# Patient Record
Sex: Female | Born: 1995
Health system: Southern US, Community
[De-identification: ages and names within clinical notes are randomized; demographics above are authoritative.]

## PROBLEM LIST (undated history)

## (undated) ENCOUNTER — Inpatient Hospital Stay: Payer: Self-pay

## (undated) ENCOUNTER — Inpatient Hospital Stay (HOSPITAL_COMMUNITY): Payer: Self-pay

## (undated) DIAGNOSIS — F418 Other specified anxiety disorders: Secondary | ICD-10-CM

## (undated) DIAGNOSIS — O4400 Placenta previa specified as without hemorrhage, unspecified trimester: Secondary | ICD-10-CM

## (undated) DIAGNOSIS — Z8632 Personal history of gestational diabetes: Secondary | ICD-10-CM

## (undated) DIAGNOSIS — O24419 Gestational diabetes mellitus in pregnancy, unspecified control: Secondary | ICD-10-CM

## (undated) DIAGNOSIS — N939 Abnormal uterine and vaginal bleeding, unspecified: Secondary | ICD-10-CM

## (undated) DIAGNOSIS — G43909 Migraine, unspecified, not intractable, without status migrainosus: Secondary | ICD-10-CM

## (undated) DIAGNOSIS — Q909 Down syndrome, unspecified: Secondary | ICD-10-CM

## (undated) DIAGNOSIS — R519 Headache, unspecified: Secondary | ICD-10-CM

## (undated) DIAGNOSIS — Z20828 Contact with and (suspected) exposure to other viral communicable diseases: Secondary | ICD-10-CM

## (undated) HISTORY — DX: Headache, unspecified: R51.9

## (undated) HISTORY — DX: Complete placenta previa nos or without hemorrhage, unspecified trimester: O44.00

## (undated) HISTORY — DX: Gestational diabetes mellitus in pregnancy, unspecified control: O24.419

## (undated) HISTORY — DX: Down syndrome, unspecified: Q90.9

## (undated) HISTORY — DX: Contact with and (suspected) exposure to other viral communicable diseases: Z20.828

## (undated) HISTORY — PX: WISDOM TOOTH EXTRACTION: SHX21

---
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
## 2013-03-25 ENCOUNTER — Ambulatory Visit: Payer: Self-pay | Admitting: Urology

## 2014-02-06 ENCOUNTER — Ambulatory Visit: Payer: Self-pay | Admitting: Obstetrics and Gynecology

## 2016-11-08 ENCOUNTER — Emergency Department
Admission: EM | Admit: 2016-11-08 | Discharge: 2016-11-08 | Discharge status: Against Medical Advice | Payer: BLUE CROSS/BLUE SHIELD | Attending: Emergency Medicine | Admitting: Emergency Medicine

## 2016-11-08 ENCOUNTER — Encounter: Payer: Self-pay | Admitting: *Deleted

## 2016-11-08 ENCOUNTER — Emergency Department: Payer: BLUE CROSS/BLUE SHIELD

## 2016-11-08 DIAGNOSIS — O009 Unspecified ectopic pregnancy without intrauterine pregnancy: Secondary | ICD-10-CM

## 2016-11-08 DIAGNOSIS — O021 Missed abortion: Secondary | ICD-10-CM | POA: Diagnosis not present

## 2016-11-08 DIAGNOSIS — O209 Hemorrhage in early pregnancy, unspecified: Secondary | ICD-10-CM | POA: Diagnosis present

## 2016-11-08 DIAGNOSIS — N939 Abnormal uterine and vaginal bleeding, unspecified: Secondary | ICD-10-CM

## 2016-11-08 DIAGNOSIS — O4690 Antepartum hemorrhage, unspecified, unspecified trimester: Secondary | ICD-10-CM

## 2016-11-08 LAB — URINALYSIS, COMPLETE (UACMP) WITH MICROSCOPIC
Bacteria, UA: NONE SEEN
Bilirubin Urine: NEGATIVE
GLUCOSE, UA: NEGATIVE mg/dL
KETONES UR: NEGATIVE mg/dL
NITRITE: NEGATIVE
PH: 5 (ref 5.0–8.0)
PROTEIN: 30 mg/dL — AB
Specific Gravity, Urine: 1.017 (ref 1.005–1.030)

## 2016-11-08 LAB — ABO/RH: ABO/RH(D): A POS

## 2016-11-08 LAB — CBC
HEMATOCRIT: 41.3 % (ref 35.0–47.0)
HEMOGLOBIN: 14.5 g/dL (ref 12.0–16.0)
MCH: 29.3 pg (ref 26.0–34.0)
MCHC: 35.1 g/dL (ref 32.0–36.0)
MCV: 83.5 fL (ref 80.0–100.0)
Platelets: 191 10*3/uL (ref 150–440)
RBC: 4.95 MIL/uL (ref 3.80–5.20)
RDW: 12.9 % (ref 11.5–14.5)
WBC: 6 10*3/uL (ref 3.6–11.0)

## 2016-11-08 LAB — HCG, QUANTITATIVE, PREGNANCY: hCG, Beta Chain, Quant, S: 6865 m[IU]/mL — ABNORMAL HIGH (ref <5)

## 2016-11-08 NOTE — ED Notes (Signed)
Pt not in room or lobby upon checking on her.

## 2016-11-08 NOTE — ED Notes (Signed)
Pt updated on status, awaiting OBGYN to call MD.

## 2016-11-08 NOTE — ED Triage Notes (Signed)
States vaginal bleeding that began last night, states she is [redacted] weeks pregnant, 1st pregnancy, states abd cramping

## 2016-11-08 NOTE — ED Notes (Signed)
Pt updated on status, awaiting OBGYN to call MD.  

## 2016-11-08 NOTE — ED Provider Notes (Signed)
[facility] Emergency Department Provider Note  ____________________________________________  Time seen: Approximately 5:39 PM  I have reviewed the triage vital signs and the nursing notes.   HISTORY  Chief Complaint Vaginal Bleeding    HPI Caroline [name] is a 20 y.o. female who complains of vaginal bleeding for "my whole pregnancy". She was seen at OB/GYN side Dr. Jean RosenthalJackson 2 weeks ago, reports having a ultrasound done in the office and told everything was fine at the time but that she had a "big blood clot" that she needed to pass.  Denies chest pain shortness of breath dysuria frequency urgency. No dizziness or syncope. No exertional symptoms. Feels like her bleeding has increased over the last 2 or 3 days. Denies pain or contractions. No cramping. Is 9-[redacted] weeks pregnant currently G1 P0     History reviewed. No pertinent past medical history.   There are no active problems to display for this patient.    No past surgical history on file. None  Prior to Admission medications   Not on File  None   Allergies Patient has no known allergies.   History reviewed. No pertinent family history.  Social History Social History  Substance Use Topics  . Smoking status: Not on file  . Smokeless tobacco: Not on file  . Alcohol use Not on file  No tobacco or alcohol use  Review of Systems  Constitutional:   No fever or chills.  ENT:   No sore throat. No rhinorrhea. Cardiovascular:   No chest pain. Respiratory:   No dyspnea or cough. Gastrointestinal:   Negative for abdominal pain, vomiting and diarrhea.  Genitourinary:   Negative for dysuria or difficulty urinating. Musculoskeletal:   Negative for focal pain or swelling Neurological:   Negative for headaches 10-point ROS otherwise negative.  ____________________________________________   PHYSICAL EXAM:  VITAL SIGNS: ED Triage Vitals  Enc Vitals Group     BP 11/08/16 1128 125/74      Pulse Rate 11/08/16 1128 70     Resp 11/08/16 1128 18     Temp 11/08/16 1128 98 F (36.7 C)     Temp Source 11/08/16 1128 Oral     SpO2 11/08/16 1128 99 %     Weight 11/08/16 1128 112 lb (50.8 kg)     Height 11/08/16 1128 5\' 2"  (1.575 m)     Head Circumference --      Peak Flow --      Pain Score 11/08/16 1129 8     Pain Loc --      Pain Edu? --      Excl. in GC? --     Vital signs reviewed, nursing assessments reviewed.   Constitutional:   Alert and oriented. Well appearing and in no distress. Eyes:   No scleral icterus. No conjunctival pallor. PERRL. EOMI.  No nystagmus. ENT   Head:   Normocephalic and atraumatic.   Nose:   No congestion/rhinnorhea. No septal hematoma   Mouth/Throat:   MMM, no pharyngeal erythema. No peritonsillar mass.    Neck:   No stridor. No SubQ emphysema. No meningismus. Hematological/Lymphatic/Immunilogical:   No cervical lymphadenopathy. Cardiovascular:   RRR. Symmetric bilateral radial and DP pulses.  No murmurs.  Respiratory:   Normal respiratory effort without tachypnea nor retractions. Breath sounds are clear and equal bilaterally. No wheezes/rales/rhonchi. Gastrointestinal:   Soft and nontender. Non distended. There is no CVA tenderness.  No rebound, rigidity, or guarding.  Musculoskeletal:   Nontender with normal range  of motion in all extremities. No joint effusions.  No lower extremity tenderness.  No edema. Neurologic:   Normal speech and language.  CN 2-10 normal. Motor grossly intact. No gross focal neurologic deficits are appreciated.  Skin:    Skin is warm, dry and intact. No rash noted.  No petechiae, purpura, or bullae.  ____________________________________________    LABS (pertinent positives/negatives) (all labs ordered are listed, but only abnormal results are displayed) Labs Reviewed  HCG, QUANTITATIVE, PREGNANCY - Abnormal; Notable for the following:       Result Value   hCG, Beta Chain, Quant, S 6,865 (*)     All other components within normal limits  URINALYSIS, COMPLETE (UACMP) WITH MICROSCOPIC - Abnormal; Notable for the following:    Color, Urine YELLOW (*)    APPearance HAZY (*)    Hgb urine dipstick LARGE (*)    Protein, ur 30 (*)    Leukocytes, UA SMALL (*)    Squamous Epithelial / LPF 0-5 (*)    All other components within normal limits  URINE CULTURE  CBC  ABO/RH   ____________________________________________   EKG    ____________________________________________    RADIOLOGY  Ultrasound pelvis reveals a week size fetus, no cardiac activity consistent with fetal demise  ____________________________________________   PROCEDURES Procedures  ____________________________________________   INITIAL IMPRESSION / ASSESSMENT AND PLAN / ED COURSE  Pertinent labs & imaging results that were available during my care of the patient were reviewed by me and considered in my medical decision making (see chart for details).  Patient presents with vaginal bleeding in first trimester pregnancy. Due to inability to excess [facility] imaging records were repeated ultrasound today to evaluate for ectopic. Negative for ectopic but positive for fetal demise, likely a week ago or more.  We paged [facility] on-call physician 2 times to seek advice about likely missed AB and arrange follow-up, but in the process of waiting for a call back, the patient eloped from the emergency department. I was unable to discuss her results with her. Vital signs are normal, she has medical decision-making capacity, she sounded distress she is calm and comfortable. Was unable to do a pelvic exam due to her elopement.  [facility] Dr. Jean RosenthalJackson in hopes of expediting follow-up.     Clinical Course    ____________________________________________   FINAL CLINICAL IMPRESSION(S) / ED DIAGNOSES  Final diagnoses:  Vaginal bleeding  Ectopic pregnancy  Fetal demise before 20 weeks  with retention of dead fetus  Vaginal bleeding in pregnancy      New Prescriptions   No medications on file     Portions of this note were generated with dragon dictation software. Dictation errors may occur despite best attempts at proofreading.    Sharman CheekPhillip Monalisa Bayless, MD 11/08/16 [phone]

## 2016-11-10 LAB — URINE CULTURE: CULTURE: NO GROWTH

## 2017-02-13 ENCOUNTER — Encounter: Payer: Self-pay | Admitting: Obstetrics and Gynecology

## 2017-02-13 ENCOUNTER — Ambulatory Visit (INDEPENDENT_AMBULATORY_CARE_PROVIDER_SITE_OTHER): Payer: BLUE CROSS/BLUE SHIELD | Admitting: Obstetrics and Gynecology

## 2017-02-13 VITALS — BP 100/66 | HR 98 | Wt 120.0 lb

## 2017-02-13 DIAGNOSIS — Z3490 Encounter for supervision of normal pregnancy, unspecified, unspecified trimester: Secondary | ICD-10-CM

## 2017-02-13 DIAGNOSIS — Z3A01 Less than 8 weeks gestation of pregnancy: Secondary | ICD-10-CM

## 2017-02-13 DIAGNOSIS — O3680X0 Pregnancy with inconclusive fetal viability, not applicable or unspecified: Secondary | ICD-10-CM

## 2017-02-13 DIAGNOSIS — Z8759 Personal history of other complications of pregnancy, childbirth and the puerperium: Secondary | ICD-10-CM | POA: Diagnosis not present

## 2017-02-13 NOTE — Patient Instructions (Signed)
First Trimester of Pregnancy The first trimester of pregnancy is from week 1 until the end of week 13 (months 1 through 3). During this time, your baby will begin to develop inside you. At 6-8 weeks, the eyes and face are formed, and the heartbeat can be seen on ultrasound. At the end of 12 weeks, all the baby's organs are formed. Prenatal care is all the medical care you receive before the birth of your baby. Make sure you get good prenatal care and follow all of your doctor's instructions. Follow these instructions at home: Medicines  Take over-the-counter and prescription medicines only as told by your doctor. Some medicines are safe and some medicines are not safe during pregnancy.  Take a prenatal vitamin that contains at least 600 micrograms (mcg) of folic acid.  If you have trouble pooping (constipation), take medicine that will make your stool soft (stool softener) if your doctor approves. Eating and drinking  Eat regular, healthy meals.  Your doctor will tell you the amount of weight gain that is right for you.  Avoid raw meat and uncooked cheese.  If you feel sick to your stomach (nauseous) or throw up (vomit): ? Eat 4 or 5 small meals a day instead of 3 large meals. ? Try eating a few soda crackers. ? Drink liquids between meals instead of during meals.  To prevent constipation: ? Eat foods that are high in fiber, like fresh fruits and vegetables, whole grains, and beans. ? Drink enough fluids to keep your pee (urine) clear or pale yellow. Activity  Exercise only as told by your doctor. Stop exercising if you have cramps or pain in your lower belly (abdomen) or low back.  Do not exercise if it is too hot, too humid, or if you are in a place of great height (high altitude).  Try to avoid standing for long periods of time. Move your legs often if you must stand in one place for a long time.  Avoid heavy lifting.  Wear low-heeled shoes. Sit and stand up straight.  You  can have sex unless your doctor tells you not to. Relieving pain and discomfort  Wear a good support bra if your breasts are sore.  Take warm water baths (sitz baths) to soothe pain or discomfort caused by hemorrhoids. Use hemorrhoid cream if your doctor says it is okay.  Rest with your legs raised if you have leg cramps or low back pain.  If you have puffy, bulging veins (varicose veins) in your legs: ? Wear support hose or compression stockings as told by your doctor. ? Raise (elevate) your feet for 15 minutes, 3-4 times a day. ? Limit salt in your food. Prenatal care  Schedule your prenatal visits by the twelfth week of pregnancy.  Write down your questions. Take them to your prenatal visits.  Keep all your prenatal visits as told by your doctor. This is important. Safety  Wear your seat belt at all times when driving.  Make a list of emergency phone numbers. The list should include numbers for family, friends, the hospital, and police and fire departments. General instructions  Ask your doctor for a referral to a local prenatal class. Begin classes no later than at the start of month 6 of your pregnancy.  Ask for help if you need counseling or if you need help with nutrition. Your doctor can give you advice or tell you where to go for help.  Do not use hot tubs, steam rooms, or   saunas.  Do not douche or use tampons or scented sanitary pads.  Do not cross your legs for long periods of time.  Avoid all herbs and alcohol. Avoid drugs that are not approved by your doctor.  Do not use any tobacco products, including cigarettes, chewing tobacco, and electronic cigarettes. If you need help quitting, ask your doctor. You may get counseling or other support to help you quit.  Avoid cat litter boxes and soil used by cats. These carry germs that can cause birth defects in the baby and can cause a loss of your baby (miscarriage) or stillbirth.  Visit your dentist. At home, brush  your teeth with a soft toothbrush. Be gentle when you floss. Contact a doctor if:  You are dizzy.  You have mild cramps or pressure in your lower belly.  You have a nagging pain in your belly area.  You continue to feel sick to your stomach, you throw up, or you have watery poop (diarrhea).  You have a bad smelling fluid coming from your vagina.  You have pain when you pee (urinate).  You have increased puffiness (swelling) in your face, hands, legs, or ankles. Get help right away if:  You have a fever.  You are leaking fluid from your vagina.  You have spotting or bleeding from your vagina.  You have very bad belly cramping or pain.  You gain or lose weight rapidly.  You throw up blood. It may look like coffee grounds.  You are around people who have German measles, fifth disease, or chickenpox.  You have a very bad headache.  You have shortness of breath.  You have any kind of trauma, such as from a fall or a car accident. Summary  The first trimester of pregnancy is from week 1 until the end of week 13 (months 1 through 3).  To take care of yourself and your unborn baby, you will need to eat healthy meals, take medicines only if your doctor tells you to do so, and do activities that are safe for you and your baby.  Keep all follow-up visits as told by your doctor. This is important as your doctor will have to ensure that your baby is healthy and growing well. This information is not intended to replace advice given to you by your health care provider. Make sure you discuss any questions you have with your health care provider. Document Released: 05/01/2008 Document Revised: 11/21/2016 Document Reviewed: 11/21/2016 Elsevier Interactive Patient Education  2017 Elsevier Inc.  

## 2017-02-13 NOTE — Progress Notes (Signed)
Obstetrics & Gynecology Office Visit   Chief Complaint:  Chief Complaint  Patient presents with  . Committee Review    Early OB S/P miscarriage in 10/2016    History of Present Illness: [name] is a 21 y.o. G2P0010 at 6769w5d by LMP of 12/28/16 presenting for a work in appointment for early pregnancy in setting of first trimester miscarriage 3 months ago.  She has a NOB scheduled for later next week.  Confirmation visit was at the health department yesterday.  Her prior 1st trimester miscarriage was a planned pregnancy, this pregnancy is unplanned and she reports taking the morning after pill early in February.  Also believes her period in February was "unusual" and believes she may have conceived as early as January.  She is uncertain on her plan for pregnancy continuation at this time.  Reports some mild nausea already, fatigue, breast tenderness.  She reports some mild cramping, no vaginal bleeding.  She has not started prenatal vitamins at this time.  She is requesting an ultrasound for pregnancy dating and viability.  She may wish to ultimately [facility] as she was unhappy with her care last pregnancy.      Review of Systems: Review of Systems  Constitutional: Negative for chills and fever.  HENT: Negative for congestion.   Respiratory: Negative for cough and shortness of breath.   Cardiovascular: Negative for chest pain and palpitations.  Gastrointestinal: Positive for abdominal pain and nausea. Negative for constipation, diarrhea, heartburn and vomiting.  Genitourinary: Negative for dysuria, frequency and urgency.  Skin: Negative for itching and rash.  Neurological: Negative for dizziness and headaches.  Endo/Heme/Allergies: Negative for polydipsia.  Psychiatric/Behavioral: Negative for depression.    Past Medical History:  History reviewed. No pertinent past medical history.  Past Surgical History:  History reviewed. No  pertinent surgical history.  Gynecologic History: Patient's last menstrual period was 12/28/2016.  Obstetric History: G2P0010  Family History:  History reviewed. No pertinent family history.  Social History:  Social History   Social History  . Marital status: Single    Spouse name: N/A  . Number of children: N/A  . Years of education: N/A   Occupational History  . Not on file.   Social History Main Topics  . Smoking status: Never Smoker  . Smokeless tobacco: Never Used  . Alcohol use Yes     Comment: social  . Drug use: No  . Sexual activity: Yes   Other Topics Concern  . Not on file   Social History Narrative  . No narrative on file    Allergies:  Allergies no known allergies  Medications: Prior to Admission medications   Not on File    Physical Exam Vitals:  Vitals:   02/13/17 1048  BP: 100/66  Pulse: 98   Patient's last menstrual period was 12/28/2016.  General: NAD HEENT: normocephalic, anicteric Pulmonary: No increased work of breathing Extremities: no edema, erythema, or tenderness Neurologic: Grossly intact Psychiatric: mood appropriate, affect full  Female chaperone present for pelvic and breast  portions of the physical exam  Assessment: 21 y.o. G2P0010 No problem-specific Assessment & Plan notes found for this encounter.   Plan: Problem List Items Addressed This Visit    None    Visit Diagnoses    Miscarriage within last 12 months    -  Primary   Relevant Orders   Beta HCG, Quant   HCG, quantitative, pregnancy   US OB Comp  Less 14 Wks   Pregnancy, location unknown       Relevant Orders   Beta HCG, Quant   HCG, quantitative, pregnancy   US OB Comp Less 14 Wks   [redacted] weeks gestation of pregnancy       Relevant Orders   US OB Comp Less 14 Wks     1) Early IUP - given history of emergency contraceptive use will trend HCG to verify appropriate rise.  Assuming LMP is correct will obtain viability ultrasound at the time of her NOB  visit next week  2) History of prior miscarriage - we discussed pregnancy failure in the first trimester is common affecting up to 20% of early clinically recognized pregnancies.  A single prior first trimester loss does not increase her risk of having a repeat loss, as majority of these losses (up to 70%) can be attributed to spontaneous genetic defects .    3) Start PNV - importance of PNV in decreasing the incidence of neural tube defects was discussed.  She was given PNV at health department yesterday and received samples from me as well as a sample bottle of diclegis

## 2017-02-14 ENCOUNTER — Telehealth: Payer: Self-pay

## 2017-02-14 LAB — BETA HCG QUANT (REF LAB): hCG Quant: 61306 m[IU]/mL

## 2017-02-14 NOTE — Telephone Encounter (Signed)
Left message for pt with lab results

## 2017-02-14 NOTE — Telephone Encounter (Signed)
Pt calling for lab results. [phone]

## 2017-02-15 ENCOUNTER — Ambulatory Visit (INDEPENDENT_AMBULATORY_CARE_PROVIDER_SITE_OTHER): Payer: BLUE CROSS/BLUE SHIELD

## 2017-02-15 ENCOUNTER — Other Ambulatory Visit: Payer: BLUE CROSS/BLUE SHIELD

## 2017-02-15 ENCOUNTER — Telehealth: Payer: Self-pay | Admitting: Obstetrics and Gynecology

## 2017-02-15 DIAGNOSIS — O3680X0 Pregnancy with inconclusive fetal viability, not applicable or unspecified: Secondary | ICD-10-CM

## 2017-02-15 DIAGNOSIS — Z3490 Encounter for supervision of normal pregnancy, unspecified, unspecified trimester: Secondary | ICD-10-CM | POA: Diagnosis not present

## 2017-02-15 DIAGNOSIS — Z3A01 Less than 8 weeks gestation of pregnancy: Secondary | ICD-10-CM | POA: Diagnosis not present

## 2017-02-15 DIAGNOSIS — Z8759 Personal history of other complications of pregnancy, childbirth and the puerperium: Secondary | ICD-10-CM

## 2017-02-15 NOTE — Addendum Note (Signed)
Addended by: Kathlene CoteSTANLEY, Shaely Gadberry G on: 02/15/2017 11:01 AM   Modules accepted: Orders

## 2017-02-15 NOTE — Telephone Encounter (Signed)
Pt is schedule For Dating Ultrasound today at 10:30 . Per your advisement.

## 2017-02-15 NOTE — Telephone Encounter (Signed)
Can you let her know that ultrasound looks good, right on for the LMP of 12/28/16, single pregnancy, good heartbeat, and due date remains 11.08.2018.  Didn't have a chance to call till 9:30PM and was unavailable

## 2017-02-15 NOTE — Telephone Encounter (Signed)
FYI, Per your request

## 2017-02-15 NOTE — Telephone Encounter (Signed)
-----   Message from Vena AustriaAndreas Staebler, MD sent at 02/14/2017  3:06 PM EDT ----- Regarding: Ultrasound Patient should have ultrasound ordered next week, the HCG level is high enough that we should for sure see something on ultrasound so if we have an early ultrasound slot open to do her dating scan this week she'd prefer that.  If its done on Thursday I can review it from the hospital and call her if it's done on Friday she'll have to see someone as I'll be out of town

## 2017-02-16 LAB — BETA HCG QUANT (REF LAB): hCG Quant: 68501 m[IU]/mL

## 2017-02-16 NOTE — Telephone Encounter (Signed)
Pt aware and was thankful for call. KJ CMA

## 2017-02-22 ENCOUNTER — Telehealth: Payer: Self-pay

## 2017-02-22 ENCOUNTER — Encounter: Payer: BLUE CROSS/BLUE SHIELD | Admitting: Advanced Practice Midwife

## 2017-02-22 ENCOUNTER — Other Ambulatory Visit: Payer: BLUE CROSS/BLUE SHIELD

## 2017-02-22 NOTE — Telephone Encounter (Signed)
Pt needs a confirmation of preg.  Saw AMS. [phone]

## 2017-02-22 NOTE — Telephone Encounter (Signed)
Pt aware letter is ready for pick up. KJ CMA

## 2017-02-27 ENCOUNTER — Encounter: Payer: Self-pay | Admitting: *Deleted

## 2017-03-02 ENCOUNTER — Encounter: Payer: Self-pay | Admitting: Obstetrics and Gynecology

## 2017-03-05 ENCOUNTER — Encounter: Payer: BLUE CROSS/BLUE SHIELD | Admitting: Obstetrics and Gynecology

## 2017-03-26 ENCOUNTER — Encounter: Payer: Self-pay | Admitting: *Deleted

## 2017-03-26 ENCOUNTER — Ambulatory Visit (INDEPENDENT_AMBULATORY_CARE_PROVIDER_SITE_OTHER): Payer: BLUE CROSS/BLUE SHIELD | Admitting: Obstetrics & Gynecology

## 2017-03-26 ENCOUNTER — Other Ambulatory Visit (HOSPITAL_COMMUNITY)
Admission: RE | Admit: 2017-03-26 | Discharge: 2017-03-26 | Disposition: A | Discharge status: Home / Self Care | Payer: BLUE CROSS/BLUE SHIELD | Source: Ambulatory Visit | Attending: Obstetrics & Gynecology | Admitting: Obstetrics & Gynecology

## 2017-03-26 ENCOUNTER — Encounter: Payer: Self-pay | Admitting: Obstetrics & Gynecology

## 2017-03-26 DIAGNOSIS — O09891 Supervision of other high risk pregnancies, first trimester: Secondary | ICD-10-CM

## 2017-03-26 DIAGNOSIS — Z113 Encounter for screening for infections with a predominantly sexual mode of transmission: Secondary | ICD-10-CM

## 2017-03-26 DIAGNOSIS — Z3480 Encounter for supervision of other normal pregnancy, unspecified trimester: Secondary | ICD-10-CM

## 2017-03-26 DIAGNOSIS — Z3689 Encounter for other specified antenatal screening: Secondary | ICD-10-CM | POA: Diagnosis not present

## 2017-03-26 DIAGNOSIS — O0993 Supervision of high risk pregnancy, unspecified, third trimester: Secondary | ICD-10-CM | POA: Insufficient documentation

## 2017-03-26 DIAGNOSIS — Z20828 Contact with and (suspected) exposure to other viral communicable diseases: Secondary | ICD-10-CM

## 2017-03-26 DIAGNOSIS — Z3481 Encounter for supervision of other normal pregnancy, first trimester: Secondary | ICD-10-CM

## 2017-03-26 DIAGNOSIS — Z124 Encounter for screening for malignant neoplasm of cervix: Secondary | ICD-10-CM

## 2017-03-26 DIAGNOSIS — O09899 Supervision of other high risk pregnancies, unspecified trimester: Secondary | ICD-10-CM | POA: Insufficient documentation

## 2017-03-26 HISTORY — DX: Contact with and (suspected) exposure to other viral communicable diseases: Z20.828

## 2017-03-26 MED ORDER — FERROUS SULFATE 325 (65 FE) MG PO TABS: 325.0000 mg | ORAL_TABLET | Freq: Two times a day (BID) | ORAL | 1 refills | Status: DC

## 2017-03-26 NOTE — Patient Instructions (Signed)

## 2017-03-26 NOTE — Progress Notes (Signed)
Subjective:   Caroline [name] is a 21 y.o. G2P0010 at [redacted]w[redacted]d by LMP being seen today for her first obstetrical visit.  Her obstetrical history is significant for recent first trimester SAB. Patient currently works for a International aid/development worker, does Engineer, civil (consulting), and was exposed to a radioactive cat.  Patient was also exposed to a dog sick with parvovirus.  Patient does intend to breast feed. Pregnancy history fully reviewed.  Patient reports mild nausea, no vomiting.  HISTORY: Obstetric History   G2   P0   T0   P0   A1   L0    SAB1   TAB0   Ectopic0   Multiple0   Live Births0     # Outcome Date GA Lbr Len/2nd Weight Sex Delivery Anes PTL Lv  2 Current           1 SAB 10/2016             History reviewed. No pertinent past medical history. History reviewed. No pertinent surgical history. Family History  Problem Relation Age of Onset  . Kidney failure Mother   . Drug abuse Mother   . Drug abuse Maternal Grandmother   . Hypertension Paternal Grandfather   . Lung cancer Paternal Grandfather   . Diabetes Paternal Grandfather    Social History  Substance Use Topics  . Smoking status: Never Smoker  . Smokeless tobacco: Never Used  . Alcohol use No     Comment: social   No Known Allergies No current outpatient prescriptions on file prior to visit.   No current facility-administered medications on file prior to visit.      Exam   Vitals:   03/26/17 1456  BP: 101/66  Pulse: 86  Weight: 123 lb (55.8 kg)    FHR visualized on ultrasound.  Uterus:     Pelvic Exam: Perineum: no hemorrhoids, normal perineum   Vulva: normal external genitalia, no lesions   Vagina:  normal mucosa, normal discharge   Cervix: no lesions and normal, pap smear done.    Adnexa: normal adnexa and no mass, fullness, tenderness   Bony Pelvis: average  System: General: well-developed, well-nourished female in no acute distress   Breast:  normal appearance, no masses or tenderness   Skin:  normal coloration and turgor, no rashes   Neurologic: oriented, normal, negative, normal mood   Extremities: normal strength, tone, and muscle mass, ROM of all joints is normal   HEENT PERRLA, extraocular movement intact and sclera clear, anicteric   Mouth/Teeth mucous membranes moist, pharynx normal without lesions and dental hygiene good   Neck supple and no masses   Cardiovascular: regular rate and rhythm   Respiratory:  no respiratory distress, normal breath sounds   Abdomen: soft, non-tender; bowel sounds normal; no masses,  no organomegaly     Assessment:   Pregnancy: G2P0010 Patient Active Problem List   Diagnosis Date Noted  . Supervision of other normal pregnancy, antepartum 03/26/2017  . Parvovirus exposure 03/26/2017  . Radiation exposure affecting pregnancy 03/26/2017     Plan:  1. Parvovirus exposure Patient told to limit exposure to sick dogs and other sick animals; also told to avoid litter especially from cats. Universal precautions emphasized, - Parvovirus B19 Antibody, IGG and IGM  2. Radiation exposure affecting pregnancy in first trimester Told to limit radiation exposure and to keep abdomen/pelvis shielded.   3. Supervision of other normal pregnancy, antepartum Initial labs drawn. Continue prenatal vitamins. - Prenatal Vit-Fe Fumarate-FA (PRENATAL  MULTIVITAMIN) TABS tablet; Take 1 tablet by mouth daily at 12 noon. - ferrous sulfate (FERROUSUL) 325 (65 FE) MG tablet; Take 1 tablet (325 mg total) by mouth 2 (two) times daily.  Dispense: 60 tablet; Refill: 1 - [place] MFM OB COMP + 14 WK; Future - Obstetric Panel, Including HIV - GC/Chlamydia probe amp ([place])not at [facility] - Cytology - PAP - Culture, OB Urine - [place] bedside; Future Genetic Screening discussed, First trimester screen, Quad screen and NIPS: declined. Ultrasound discussed; fetal anatomic survey: ordered. Problem list reviewed and updated. The nature of [name]  Practice with multiple MDs and other Advanced Practice Providers was explained to patient; also emphasized that residents, students are part of our team. Routine obstetric precautions reviewed. Return in about 8 weeks (around 05/21/2017) for OB 20 week visit ([facility]).     [name], MD, FACOG Attending Obstetrician & Gynecologist, [facility] for [employer], [facility]

## 2017-03-26 NOTE — Progress Notes (Signed)
Works at [employer] - exposed to radioactive cat at work - wants to know if she can perform xrays and laser treatments.  She has a dog at work that has Parvo - what are the risks?

## 2017-03-27 LAB — OBSTETRIC PANEL, INCLUDING HIV
ANTIBODY SCREEN: NEGATIVE
BASOS: 0 %
Basophils Absolute: 0 10*3/uL (ref 0.0–0.2)
EOS (ABSOLUTE): 0.2 10*3/uL (ref 0.0–0.4)
EOS: 2 %
HEMATOCRIT: 39.7 % (ref 34.0–46.6)
HEMOGLOBIN: 13.7 g/dL (ref 11.1–15.9)
HIV SCREEN 4TH GENERATION: NONREACTIVE
Hepatitis B Surface Ag: NEGATIVE
Immature Grans (Abs): 0 10*3/uL (ref 0.0–0.1)
Immature Granulocytes: 0 %
LYMPHS ABS: 2.3 10*3/uL (ref 0.7–3.1)
Lymphs: 24 %
MCH: 29.1 pg (ref 26.6–33.0)
MCHC: 34.5 g/dL (ref 31.5–35.7)
MCV: 85 fL (ref 79–97)
Monocytes Absolute: 0.5 10*3/uL (ref 0.1–0.9)
Monocytes: 5 %
NEUTROS ABS: 6.4 10*3/uL (ref 1.4–7.0)
Neutrophils: 69 %
Platelets: 199 10*3/uL (ref 150–379)
RBC: 4.7 x10E6/uL (ref 3.77–5.28)
RDW: 13.9 % (ref 12.3–15.4)
RH TYPE: POSITIVE
RPR Ser Ql: NONREACTIVE
Rubella Antibodies, IGG: 3.95 index (ref >0.99)
WBC: 9.5 10*3/uL (ref 3.4–10.8)

## 2017-03-27 LAB — CYTOLOGY - PAP
Chlamydia: NEGATIVE
Diagnosis: NEGATIVE
NEISSERIA GONORRHEA: NEGATIVE

## 2017-03-27 LAB — PARVOVIRUS B19 ANTIBODY, IGG AND IGM
Parvovirus B19 IgG: 0.4 index (ref 0.0–0.8)
Parvovirus B19 IgM: 0.1 index (ref 0.0–0.8)

## 2017-03-28 LAB — CULTURE, OB URINE

## 2017-03-28 LAB — URINE CULTURE, OB REFLEX: ORGANISM ID, BACTERIA: NO GROWTH

## 2017-04-01 ENCOUNTER — Encounter: Payer: Self-pay | Admitting: Obstetrics & Gynecology

## 2017-05-04 ENCOUNTER — Encounter (HOSPITAL_COMMUNITY): Payer: Self-pay | Admitting: Obstetrics & Gynecology

## 2017-05-10 ENCOUNTER — Ambulatory Visit (HOSPITAL_COMMUNITY)
Admission: RE | Admit: 2017-05-10 | Discharge: 2017-05-10 | Disposition: A | Discharge status: Home / Self Care | Payer: BLUE CROSS/BLUE SHIELD | Source: Ambulatory Visit | Attending: Obstetrics & Gynecology | Admitting: Obstetrics & Gynecology

## 2017-05-10 ENCOUNTER — Other Ambulatory Visit: Payer: Self-pay | Admitting: Obstetrics & Gynecology

## 2017-05-10 DIAGNOSIS — Z3482 Encounter for supervision of other normal pregnancy, second trimester: Secondary | ICD-10-CM | POA: Insufficient documentation

## 2017-05-10 DIAGNOSIS — Z3A19 19 weeks gestation of pregnancy: Secondary | ICD-10-CM | POA: Insufficient documentation

## 2017-05-10 DIAGNOSIS — Z3480 Encounter for supervision of other normal pregnancy, unspecified trimester: Secondary | ICD-10-CM

## 2017-05-10 DIAGNOSIS — O359XX0 Maternal care for (suspected) fetal abnormality and damage, unspecified, not applicable or unspecified: Secondary | ICD-10-CM

## 2017-05-21 ENCOUNTER — Ambulatory Visit (INDEPENDENT_AMBULATORY_CARE_PROVIDER_SITE_OTHER): Payer: BLUE CROSS/BLUE SHIELD | Admitting: Obstetrics & Gynecology

## 2017-05-21 VITALS — BP 99/64 | HR 91 | Wt 126.0 lb

## 2017-05-21 DIAGNOSIS — R9389 Abnormal findings on diagnostic imaging of other specified body structures: Secondary | ICD-10-CM | POA: Insufficient documentation

## 2017-05-21 DIAGNOSIS — Z3482 Encounter for supervision of other normal pregnancy, second trimester: Secondary | ICD-10-CM

## 2017-05-21 DIAGNOSIS — R938 Abnormal findings on diagnostic imaging of other specified body structures: Secondary | ICD-10-CM

## 2017-05-21 DIAGNOSIS — Z3480 Encounter for supervision of other normal pregnancy, unspecified trimester: Secondary | ICD-10-CM

## 2017-05-21 NOTE — Progress Notes (Signed)
   PRENATAL VISIT NOTE  Subjective:  Caroline [name] is a 21 y.o. G2P0010 at 2678w4d being seen today for ongoing prenatal care.  She is currently monitored for the following issues for this low-risk pregnancy and has Supervision of other normal pregnancy, antepartum; Parvovirus exposure; Radiation exposure affecting pregnancy; and Abnormal ultrasound on her problem list.  Patient reports no complaints.  Contractions: Not present. Vag. Bleeding: None.  Movement: Present. Denies leaking of fluid.   The following portions of the patient's history were reviewed and updated as appropriate: allergies, current medications, past family history, past medical history, past social history, past surgical history and problem list. Problem list updated.  Objective:   Vitals:   05/21/17 1607  BP: 99/64  Pulse: 91  Weight: 126 lb (57.2 kg)    Fetal Status:     Movement: Present     General:  Alert, oriented and cooperative. Patient is in no acute distress.  Skin: Skin is warm and dry. No rash noted.   Cardiovascular: Normal heart rate noted  Respiratory: Normal respiratory effort, no problems with respiration noted  Abdomen: Soft, gravid, appropriate for gestational age. Pain/Pressure: Absent     Pelvic:  Cervical exam deferred        Extremities: Normal range of motion.  Edema: None  Mental Status: Normal mood and affect. Normal behavior. Normal judgment and thought content.   Assessment and Plan:  Pregnancy: G2P0010 at 6078w4d  1. Supervision of other normal pregnancy, antepartum   2. Abnormal ultrasound - Declines NIPS  Preterm labor symptoms and general obstetric precautions including but not limited to vaginal bleeding, contractions, leaking of fluid and fetal movement were reviewed in detail with the patient. Please refer to After Visit Summary for other counseling recommendations.  Return in about 8 weeks (around 07/16/2017) for 2 hour GTT.   Allie BossierMyra C Aileena Iglesia, MD

## 2017-05-28 ENCOUNTER — Other Ambulatory Visit: Payer: Self-pay | Admitting: Obstetrics & Gynecology

## 2017-05-28 DIAGNOSIS — Z3480 Encounter for supervision of other normal pregnancy, unspecified trimester: Secondary | ICD-10-CM

## 2017-06-04 ENCOUNTER — Inpatient Hospital Stay (HOSPITAL_COMMUNITY)
Admission: AD | Admit: 2017-06-04 | Discharge: 2017-06-04 | Disposition: A | Discharge status: Home / Self Care | Payer: BLUE CROSS/BLUE SHIELD | Source: Ambulatory Visit | Attending: Family Medicine | Admitting: Family Medicine

## 2017-06-04 ENCOUNTER — Encounter: Payer: Self-pay | Admitting: Radiology

## 2017-06-04 ENCOUNTER — Encounter (HOSPITAL_COMMUNITY): Payer: Self-pay | Admitting: *Deleted

## 2017-06-04 DIAGNOSIS — Z8249 Family history of ischemic heart disease and other diseases of the circulatory system: Secondary | ICD-10-CM | POA: Diagnosis not present

## 2017-06-04 DIAGNOSIS — R1030 Lower abdominal pain, unspecified: Secondary | ICD-10-CM | POA: Diagnosis not present

## 2017-06-04 DIAGNOSIS — O26899 Other specified pregnancy related conditions, unspecified trimester: Secondary | ICD-10-CM

## 2017-06-04 DIAGNOSIS — Z833 Family history of diabetes mellitus: Secondary | ICD-10-CM | POA: Diagnosis not present

## 2017-06-04 DIAGNOSIS — O26892 Other specified pregnancy related conditions, second trimester: Secondary | ICD-10-CM | POA: Diagnosis not present

## 2017-06-04 DIAGNOSIS — Z813 Family history of other psychoactive substance abuse and dependence: Secondary | ICD-10-CM | POA: Insufficient documentation

## 2017-06-04 DIAGNOSIS — Z841 Family history of disorders of kidney and ureter: Secondary | ICD-10-CM | POA: Insufficient documentation

## 2017-06-04 DIAGNOSIS — R102 Pelvic and perineal pain: Secondary | ICD-10-CM

## 2017-06-04 DIAGNOSIS — Z3A22 22 weeks gestation of pregnancy: Secondary | ICD-10-CM | POA: Diagnosis not present

## 2017-06-04 DIAGNOSIS — Z801 Family history of malignant neoplasm of trachea, bronchus and lung: Secondary | ICD-10-CM | POA: Insufficient documentation

## 2017-06-04 LAB — URINALYSIS, ROUTINE W REFLEX MICROSCOPIC
BILIRUBIN URINE: NEGATIVE
Glucose, UA: NEGATIVE mg/dL
Hgb urine dipstick: NEGATIVE
KETONES UR: 5 mg/dL — AB
NITRITE: NEGATIVE
PROTEIN: NEGATIVE mg/dL
Specific Gravity, Urine: 1.018 (ref 1.005–1.030)
pH: 5 (ref 5.0–8.0)

## 2017-06-04 LAB — WET PREP, GENITAL
CLUE CELLS WET PREP: NONE SEEN
Sperm: NONE SEEN
Trich, Wet Prep: NONE SEEN
Yeast Wet Prep HPF POC: NONE SEEN

## 2017-06-04 NOTE — MAU Note (Signed)
Woke up this am with left upper quadrant abdominal pain that radiated to lower left abdomen.  Called her OB and they told her to come. Rating pain 8/10 Has not taken any medication Pain is constant   Denies vaginal bleeding or discharge.

## 2017-06-04 NOTE — MAU Provider Note (Signed)
History     CSN: [id number]  Arrival date and time: 06/04/17 1629   First Provider Initiated Contact with Patient 06/04/17 1719      Chief Complaint  Patient presents with  . Abdominal Pain   Caroline [name] is a 21 y.o. G2P0010 at 3068w4d presenting with 1 day history of lower abdominal pain. She describes the pain is sharp and intermittent and not necessarily exacerbated by movement. Initially the pain was left lower quadrant slightly below umbilicus level but most of the day it is been located left groin and suprapubic and right groin. Does not radiate. Denies back pain. Denies irritative vaginal discharge. Denies urinary sx. No previous similar episodes. No home treatment.        OB History  Gravida Para Term Preterm AB Living  2 0 0 0 1 0  SAB TAB Ectopic Multiple Live Births  1 0 0 0 0    # Outcome Date GA Lbr Len/2nd Weight Sex Delivery Anes PTL Lv  2 Current           1 SAB 10/2016               History reviewed. No pertinent past medical history.  History reviewed. No pertinent surgical history.  Family History  Problem Relation Age of Onset  . Kidney failure Mother   . Drug abuse Mother   . Drug abuse Maternal Grandmother   . Hypertension Paternal Grandfather   . Lung cancer Paternal Grandfather   . Diabetes Paternal Grandfather     Social History  Substance Use Topics  . Smoking status: Never Smoker  . Smokeless tobacco: Never Used  . Alcohol use No     Comment: social    Allergies: No Known Allergies  Prescriptions Prior to Admission  Medication Sig Dispense Refill Last Dose  . ferrous sulfate 325 (65 FE) MG tablet TAKE 1 TABLET (325 MG TOTAL) BY MOUTH 2 (TWO) TIMES DAILY. 60 tablet 1 06/04/2017 at Unknown time  . Prenatal Vit-Fe Fumarate-FA (PRENATAL MULTIVITAMIN) TABS tablet Take 1 tablet by mouth daily at 12 noon.   06/04/2017 at Unknown time    Review of Systems  Constitutional: Negative for fever.  Gastrointestinal: Positive for  abdominal pain. Negative for anal bleeding, blood in stool, constipation, diarrhea, nausea and vomiting.  Genitourinary: Negative for dysuria, flank pain, frequency, hematuria, urgency, vaginal bleeding and vaginal discharge.   Physical Exam   Blood pressure 121/68, pulse (!) 101, temperature 98 F (36.7 C), temperature source Oral, resp. rate 17, weight 125 lb 0.6 oz (56.7 kg), last menstrual period 12/28/2016, SpO2 100 %.  Physical Exam  Nursing note and vitals reviewed. Constitutional: She is oriented to person, place, and time. She appears well-developed and well-nourished. No distress.  Eyes: Pupils are equal, round, and reactive to light.  Neck: Neck supple.  Cardiovascular: Normal rate.   Respiratory: Effort normal.  GI: Soft. There is no tenderness. There is no guarding.  Genitourinary: Vagina normal. No vaginal discharge found.  Genitourinary Comments: Cx posterior, long, closed  Neurological: She is alert and oriented to person, place, and time.  Skin: Skin is warm and dry.  Psychiatric: She has a normal mood and affect. Her behavior is normal. Thought content normal.    MAU Course  Procedures Results for orders placed or performed during the hospital encounter of 06/04/17 (from the past 24 hour(s))  Urinalysis, Routine w reflex microscopic     Status: Abnormal   Collection Time: 06/04/17  4:36  PM  Result Value Ref Range   Color, Urine YELLOW YELLOW   APPearance HAZY (A) CLEAR   Specific Gravity, Urine 1.018 1.005 - 1.030   pH 5.0 5.0 - 8.0   Glucose, UA NEGATIVE NEGATIVE mg/dL   Hgb urine dipstick NEGATIVE NEGATIVE   Bilirubin Urine NEGATIVE NEGATIVE   Ketones, ur 5 (A) NEGATIVE mg/dL   Protein, ur NEGATIVE NEGATIVE mg/dL   Nitrite NEGATIVE NEGATIVE   Leukocytes, UA LARGE (A) NEGATIVE   RBC / HPF 0-5 0 - 5 RBC/hpf   WBC, UA TOO NUMEROUS TO COUNT 0 - 5 WBC/hpf   Bacteria, UA RARE (A) NONE SEEN   Squamous Epithelial / LPF 6-30 (A) NONE SEEN   Mucous PRESENT     Hyaline Casts, UA PRESENT   Wet prep, genital     Status: Abnormal   Collection Time: 06/04/17  5:55 PM  Result Value Ref Range   Yeast Wet Prep HPF POC NONE SEEN NONE SEEN   Trich, Wet Prep NONE SEEN NONE SEEN   Clue Cells Wet Prep HPF POC NONE SEEN NONE SEEN   WBC, Wet Prep HPF POC FEW (A) NONE SEEN   Sperm NONE SEEN   Urine culture sent   Assessment and Plan  G2P0010 at [redacted]w[redacted]d 1. Pain of round ligament affecting pregnancy, antepartum    Allergies as of 06/04/2017   No Known Allergies     Medication List    TAKE these medications   ferrous sulfate 325 (65 FE) MG tablet TAKE 1 TABLET (325 MG TOTAL) BY MOUTH 2 (TWO) TIMES DAILY.   prenatal multivitamin Tabs tablet Take 1 tablet by mouth daily at 12 noon.      Follow-up Information    [facility] Follow up today.   Specialty:  Oncology Why:  Keep your scheduled prenatal appointment Contact information: [address] [place] [address] [phone]        Earley Favor 06/04/2017 6:37 PM     Caroline [name] 06/04/2017, 5:47 PM

## 2017-06-04 NOTE — Discharge Instructions (Signed)

## 2017-06-06 LAB — URINE CULTURE
Culture: NO GROWTH
SPECIAL REQUESTS: NORMAL

## 2017-06-11 ENCOUNTER — Inpatient Hospital Stay (HOSPITAL_COMMUNITY): Payer: BLUE CROSS/BLUE SHIELD

## 2017-06-11 ENCOUNTER — Encounter (HOSPITAL_COMMUNITY): Payer: Self-pay

## 2017-06-11 ENCOUNTER — Observation Stay (HOSPITAL_COMMUNITY)
Admission: AD | Admit: 2017-06-11 | Discharge: 2017-06-12 | Disposition: A | Discharge status: Home / Self Care | Payer: BLUE CROSS/BLUE SHIELD | Source: Ambulatory Visit | Attending: Obstetrics & Gynecology | Admitting: Obstetrics & Gynecology

## 2017-06-11 DIAGNOSIS — Z3A23 23 weeks gestation of pregnancy: Secondary | ICD-10-CM | POA: Insufficient documentation

## 2017-06-11 DIAGNOSIS — O356XX1 Maternal care for (suspected) damage to fetus by radiation, fetus 1: Secondary | ICD-10-CM | POA: Insufficient documentation

## 2017-06-11 DIAGNOSIS — O4692 Antepartum hemorrhage, unspecified, second trimester: Secondary | ICD-10-CM

## 2017-06-11 DIAGNOSIS — O4412 Placenta previa with hemorrhage, second trimester: Principal | ICD-10-CM | POA: Insufficient documentation

## 2017-06-11 DIAGNOSIS — O4402 Placenta previa specified as without hemorrhage, second trimester: Secondary | ICD-10-CM | POA: Diagnosis present

## 2017-06-11 DIAGNOSIS — O321XX1 Maternal care for breech presentation, fetus 1: Secondary | ICD-10-CM | POA: Insufficient documentation

## 2017-06-11 DIAGNOSIS — Z20828 Contact with and (suspected) exposure to other viral communicable diseases: Secondary | ICD-10-CM | POA: Diagnosis not present

## 2017-06-11 DIAGNOSIS — Z5320 Procedure and treatment not carried out because of patient's decision for unspecified reasons: Secondary | ICD-10-CM | POA: Insufficient documentation

## 2017-06-11 LAB — CBC
HEMATOCRIT: 41 % (ref 36.0–46.0)
HEMOGLOBIN: 14.5 g/dL (ref 12.0–15.0)
MCH: 31.5 pg (ref 26.0–34.0)
MCHC: 35.4 g/dL (ref 30.0–36.0)
MCV: 88.9 fL (ref 78.0–100.0)
Platelets: 208 10*3/uL (ref 150–400)
RBC: 4.61 MIL/uL (ref 3.87–5.11)
RDW: 14.3 % (ref 11.5–15.5)
WBC: 8.5 10*3/uL (ref 4.0–10.5)

## 2017-06-11 LAB — URINALYSIS, ROUTINE W REFLEX MICROSCOPIC
Bilirubin Urine: NEGATIVE
GLUCOSE, UA: NEGATIVE mg/dL
Ketones, ur: NEGATIVE mg/dL
NITRITE: NEGATIVE
Protein, ur: NEGATIVE mg/dL
SPECIFIC GRAVITY, URINE: 1.004 — AB (ref 1.005–1.030)
pH: 8 (ref 5.0–8.0)

## 2017-06-11 LAB — WET PREP, GENITAL
CLUE CELLS WET PREP: NONE SEEN
Sperm: NONE SEEN
TRICH WET PREP: NONE SEEN
YEAST WET PREP: NONE SEEN

## 2017-06-11 LAB — ABO/RH: ABO/RH(D): A POS

## 2017-06-11 LAB — TYPE AND SCREEN
ABO/RH(D): A POS
Antibody Screen: NEGATIVE

## 2017-06-11 MED ORDER — BETAMETHASONE SOD PHOS & ACET 6 (3-3) MG/ML IJ SUSP
12.0000 mg | INTRAMUSCULAR | Status: AC
  Administered 2017-06-11 – 2017-06-12 (×2): 12 mg via INTRAMUSCULAR
  Filled 2017-06-11 (×2): qty 2

## 2017-06-11 MED ORDER — SODIUM CHLORIDE 0.9% FLUSH
3.0000 mL | INTRAVENOUS | Status: DC | PRN
  Administered 2017-06-11 (×2): 3 mL via INTRAVENOUS
  Filled 2017-06-11 (×2): qty 3

## 2017-06-11 MED ORDER — SODIUM CHLORIDE 0.9 % IV SOLN: 250.0000 mL | INTRAVENOUS | Status: DC | PRN

## 2017-06-11 MED ORDER — ZOLPIDEM TARTRATE 5 MG PO TABS: 5.0000 mg | ORAL_TABLET | Freq: Every evening | ORAL | Status: DC | PRN

## 2017-06-11 MED ORDER — SODIUM CHLORIDE 0.9% FLUSH
3.0000 mL | Freq: Two times a day (BID) | INTRAVENOUS | Status: DC
  Administered 2017-06-11 – 2017-06-12 (×2): 3 mL via INTRAVENOUS

## 2017-06-11 MED ORDER — DOCUSATE SODIUM 100 MG PO CAPS
100.0000 mg | ORAL_CAPSULE | Freq: Every day | ORAL | Status: DC
  Filled 2017-06-11 (×2): qty 1

## 2017-06-11 MED ORDER — ACETAMINOPHEN 325 MG PO TABS: 650.0000 mg | ORAL_TABLET | ORAL | Status: DC | PRN

## 2017-06-11 MED ORDER — CALCIUM CARBONATE ANTACID 500 MG PO CHEW: 2.0000 | CHEWABLE_TABLET | ORAL | Status: DC | PRN

## 2017-06-11 MED ORDER — PRENATAL MULTIVITAMIN CH
1.0000 | ORAL_TABLET | Freq: Every day | ORAL | Status: DC
  Filled 2017-06-11 (×2): qty 1

## 2017-06-11 NOTE — MAU Note (Addendum)
This morning about 3 woke up with period like cramps, was up for like an hour, then it stopped.  At 5, woke up went to the bathroom, noted a lot of bleeding when she wiped, only a little on the pad, seeing more when she wipes or uses restroom.  Started cramping.  No placental issues on US. abd soft on palpation, no recent falls or abd trauma

## 2017-06-11 NOTE — H&P (Signed)
ANTEPARTUM ADMISSION HISTORY AND PHYSICAL NOTE   History of Present Illness: Caroline [name] is a 21 y.o. G2P0010 at 100w4d being admitted for vaginal bleeding with placenta previa. She receives [facility], and her pregnancy is complicated by radiation exposure this pregnancy (works in [employer] hospital), Parvovirus exposure, and abnormal U/S showing absent nasal bone, and EIF (pt declined NIPS). Of note, her anatomy U/S also noted low-lying placenta.   She presented to MAU d/t mild abdominal cramping since around 3 am today, and vaginal bleeding starting around 5 am, today, first noticed when on tissue paper when wiping, and also noted in the toilet; sicne then has had intermittent spotting; has not soaked through a pad. Denies any other concerns. Denies LOF and endorses fetal movement. No recent trauma.    Patient Active Problem List   Diagnosis Date Noted  . Placenta previa antepartum in second trimester 06/11/2017  . Abnormal ultrasound 05/21/2017  . Supervision of other normal pregnancy, antepartum 03/26/2017  . Parvovirus exposure 03/26/2017  . Radiation exposure affecting pregnancy 03/26/2017    Past Medical History:  Diagnosis Date  . Medical history non-contributory     Past Surgical History:  Procedure Laterality Date  . NO PAST SURGERIES      OB History  Gravida Para Term Preterm AB Living  2 0 0 0 1 0  SAB TAB Ectopic Multiple Live Births  1 0 0 0 0    # Outcome Date GA Lbr Len/2nd Weight Sex Delivery Anes PTL Lv  2 Current           1 SAB 10/2016              Social History   Social History  . Marital status: Single    Spouse name: N/A  . Number of children: N/A  . Years of education: N/A   Social History Main Topics  . Smoking status: Never Smoker  . Smokeless tobacco: Never Used  . Alcohol use No     Comment: social  . Drug use: No  . Sexual activity: Yes    Partners: Male    Birth control/ protection: None   Other Topics Concern  .  None   Social History Narrative  . None    Family History  Problem Relation Age of Onset  . Kidney failure Mother   . Drug abuse Mother   . Drug abuse Maternal Grandmother   . Hypertension Paternal Grandfather   . Lung cancer Paternal Grandfather   . Diabetes Paternal Grandfather     No Known Allergies  Prescriptions Prior to Admission  Medication Sig Dispense Refill Last Dose  . ferrous sulfate 325 (65 FE) MG tablet TAKE 1 TABLET (325 MG TOTAL) BY MOUTH 2 (TWO) TIMES DAILY. 60 tablet 1 06/10/2017 at Unknown time  . Prenatal Vit-Fe Fumarate-FA (PRENATAL MULTIVITAMIN) TABS tablet Take 1 tablet by mouth daily at 12 noon.   06/10/2017 at Unknown time    Review of Systems - General ROS: no fever or chills Psychological ROS: negative Hematological and Lymphatic ROS: positive for - bleeding problems Respiratory ROS: no cough, shortness of breath,  Cardiovascular ROS:no CP or palpitaions Gastrointestinal ROS: no N/V/D Genito-Urinary ROS: no dysuria, trouble voiding, or hematuria Musculoskeletal ROS: negative Neurological ROS: no dizziness or lightheadedness Dermatological ROS: negative  Vitals:  BP 112/66 (BP Location: Right Arm)   Pulse 93   Temp 99 F (37.2 C) (Oral)   Resp 18   Ht 5\' 2"  (1.575 m)  Wt 126 lb 8 oz (57.4 kg)   LMP 12/28/2016   SpO2 100%   BMI 23.14 kg/m  Physical Examination: CONSTITUTIONAL: Well-developed, well-nourished female in no acute distress.  HENT:  Normocephalic, atraumatic. Oropharynx is clear and moist EYES: Conjunctivae and EOM are normal. No scleral icterus.  NECK: Normal range of motion, supple SKIN: Skin is warm and dry. No rash noted. Not diaphoretic. NEUROLGIC: Alert and oriented to person, place, and time. PSYCHIATRIC: Normal mood and affect.  CARDIOVASCULAR: Normal heart rate noted, regular rhythm RESPIRATORY: Effort and breath sounds normal, no problems with respiration noted ABDOMEN: Soft, nontender, nondistended,  gravid. MUSCULOSKELETAL: Normal range of motion. No edema and no tenderness. 2+ distal pulses.  Cervix: Digital exam deferred d/t vaginal bleeding. Speculum exam w/o gross blood noted, but swabs blood stained. Cervix abnormal appearing ?macerated.  Fetal Monitoring: FHT 145 Tocometer: Flat  Labs:  Results for orders placed or performed during the hospital encounter of 06/11/17 (from the past 24 hour(s))  Urinalysis, Routine w reflex microscopic   Collection Time: 06/11/17  9:36 AM  Result Value Ref Range   Color, Urine YELLOW YELLOW   APPearance HAZY (A) CLEAR   Specific Gravity, Urine 1.004 (L) 1.005 - 1.030   pH 8.0 5.0 - 8.0   Glucose, UA NEGATIVE NEGATIVE mg/dL   Hgb urine dipstick LARGE (A) NEGATIVE   Bilirubin Urine NEGATIVE NEGATIVE   Ketones, ur NEGATIVE NEGATIVE mg/dL   Protein, ur NEGATIVE NEGATIVE mg/dL   Nitrite NEGATIVE NEGATIVE   Leukocytes, UA MODERATE (A) NEGATIVE   RBC / HPF 6-30 0 - 5 RBC/hpf   WBC, UA 6-30 0 - 5 WBC/hpf   Bacteria, UA RARE (A) NONE SEEN   Squamous Epithelial / LPF 6-30 (A) NONE SEEN   Mucous PRESENT   Wet prep, genital   Collection Time: 06/11/17 10:30 AM  Result Value Ref Range   Yeast Wet Prep HPF POC NONE SEEN NONE SEEN   Trich, Wet Prep NONE SEEN NONE SEEN   Clue Cells Wet Prep HPF POC NONE SEEN NONE SEEN   WBC, Wet Prep HPF POC MANY (A) NONE SEEN   Sperm NONE SEEN     Imaging Studies: U/S Preliminary report: ?Posterior previa. CL 3.62 cm  Assessment and Plan: Patient Active Problem List   Diagnosis Date Noted  . Placenta previa antepartum in second trimester 06/11/2017  . Abnormal ultrasound 05/21/2017  . Supervision of other normal pregnancy, antepartum 03/26/2017  . Parvovirus exposure 03/26/2017  . Radiation exposure affecting pregnancy 03/26/2017   --Admit to Antenatal --CBC and T&S --NST qShift --BMZ x 2 --Saline lock   Frederik PearJulie P Kailly Richoux, MD OB Fellow Faculty Practice, [facility]

## 2017-06-11 NOTE — MAU Provider Note (Signed)
History     CSN: [id number]  Arrival date and time: 06/11/17 4098   First Provider Initiated Contact with Patient 06/11/17 1019     Chief Complaint  Patient presents with  . Vaginal Bleeding  . Abdominal Pain   Caroline [name] is a 21 y.o. G2P0010 with IUP at [redacted]w[redacted]d who presents to MAU d/t vaginal bleeding and cramping. She reports intermittent mild abdominal cramping since around 3 am today. She then first noticed vaginal bleeding around 5 am, when she used the bathroom (wasn't on her clothes, but noted some in the toilet and when she wiped. Since then she has had continuous spotting. Has not soaked through a pad. Denies any other concerns. Denies LOF and endorses fetal movement. Denies recent trauma.     OB History    Gravida Para Term Preterm AB Living   2 0 0 0 1 0   SAB TAB Ectopic Multiple Live Births   1 0 0 0 0      Past Medical History:  Diagnosis Date  . Medical history non-contributory     Past Surgical History:  Procedure Laterality Date  . NO PAST SURGERIES      Family History  Problem Relation Age of Onset  . Kidney failure Mother   . Drug abuse Mother   . Drug abuse Maternal Grandmother   . Hypertension Paternal Grandfather   . Lung cancer Paternal Grandfather   . Diabetes Paternal Grandfather     Social History  Substance Use Topics  . Smoking status: Never Smoker  . Smokeless tobacco: Never Used  . Alcohol use No     Comment: social    Allergies: No Known Allergies  Prescriptions Prior to Admission  Medication Sig Dispense Refill Last Dose  . ferrous sulfate 325 (65 FE) MG tablet TAKE 1 TABLET (325 MG TOTAL) BY MOUTH 2 (TWO) TIMES DAILY. 60 tablet 1 06/10/2017 at Unknown time  . Prenatal Vit-Fe Fumarate-FA (PRENATAL MULTIVITAMIN) TABS tablet Take 1 tablet by mouth daily at 12 noon.   06/10/2017 at Unknown time    Review of Systems  Constitutional: Negative for activity change, chills and fever.  HENT: Negative.   Respiratory:  Negative for shortness of breath.   Cardiovascular: Negative for chest pain.  Gastrointestinal: Negative for diarrhea, nausea and vomiting.       Abdominal cramping  Genitourinary: Positive for vaginal bleeding. Negative for difficulty urinating, dysuria and vaginal discharge.  Musculoskeletal: Negative.   Skin: Negative.   Neurological: Negative for dizziness, light-headedness and headaches.  All other systems reviewed and are negative.  Physical Exam   Blood pressure 112/66, pulse 93, temperature 99 F (37.2 C), temperature source Oral, resp. rate 18, height 5\' 2"  (1.575 m), weight 126 lb 8 oz (57.4 kg), last menstrual period 12/28/2016, SpO2 100 %.  Physical Exam  Vitals reviewed. Constitutional: She is oriented to person, place, and time. She appears well-developed and well-nourished. No distress.  HENT:  Head: Normocephalic.  Eyes: No scleral icterus.  Neck: Normal range of motion. Neck supple.  Cardiovascular: Normal rate.   Respiratory: Effort normal. No respiratory distress.  GI: Soft. There is no tenderness.  Genitourinary:  Genitourinary Comments: Gentle speculum exam done. Vaginal mucosa normal appearing w/o discharge or pooling of blood or fluid. Cervix with somewhat abnormal appearing and unable to determine if placental tissue was present. Exam initially terminated until U/S done. After U/S showing long cervix, speculum exam repeated and cervix further visualized, appears macerated, otherwise no other abnormalities.  Musculoskeletal: She exhibits no edema.  Neurological: She is alert and oriented to person, place, and time.  Skin: Skin is warm and dry.  Psychiatric: She has a normal mood and affect.    MAU Course  Procedures n/a  MDM 10:52 AM --  Pt seen and examines. U/S ordered 12:50 PM -- U/S reviewed: appears to have placenta previa. 1:28 PM  -- Dicussed with attending physician, Dr. Debroah LoopArnold. Will place patient in observation for monitoring and BMZ.    Assessment and Plan  21 y.o. G2P0010 at 8684w4d presenting with vaginal bleeding. U/S c/w placenta previa. --Place in obs  --CBC and T&S --Will give BMZ x 2  Kandra NicolasJulie P Diannia Hogenson 06/11/2017, 1:48 PM

## 2017-06-12 DIAGNOSIS — O4412 Placenta previa with hemorrhage, second trimester: Secondary | ICD-10-CM | POA: Diagnosis not present

## 2017-06-12 DIAGNOSIS — Z3A23 23 weeks gestation of pregnancy: Secondary | ICD-10-CM

## 2017-06-12 DIAGNOSIS — O4402 Placenta previa specified as without hemorrhage, second trimester: Secondary | ICD-10-CM | POA: Diagnosis not present

## 2017-06-12 LAB — GC/CHLAMYDIA PROBE AMP (~~LOC~~) NOT AT ARMC
CHLAMYDIA, DNA PROBE: NEGATIVE
NEISSERIA GONORRHEA: NEGATIVE

## 2017-06-12 MED ORDER — COMPLETENATE 29-1 MG PO CHEW
1.0000 | CHEWABLE_TABLET | Freq: Every day | ORAL | Status: DC
  Administered 2017-06-12: 1 via ORAL
  Filled 2017-06-12 (×2): qty 1

## 2017-06-12 NOTE — Progress Notes (Signed)
Patient signed out AMA after consult with MD. Risks of leaving and reasons for returning to hospital reviewed. Patient verbalized understanding. AMA formed signed. Patient home with fiance.

## 2017-06-12 NOTE — Progress Notes (Signed)
Dr. Adrian BlackwaterStinson notified of pt's desire to go home today and request to speak to him. MD to come see pt when he can.

## 2017-06-12 NOTE — Discharge Summary (Addendum)
Physician Discharge Summary  Patient ID: Caroline [name] MRN: [id number] DOB/AGE: 1996/10/18 21 y.o.  Admit date: 06/11/2017 Discharge date: 06/12/2017  Admission Diagnoses:  Discharge Diagnoses:  Active Problems:   Placenta previa antepartum in second trimester   Discharged Condition: stable  Hospital Course: Patient is a G2P0010 at 3757w5d today who was admitted for vaginal bleeding that started yesterday. She had an US that showed placenta previa. She had BMZ x2 24 hours apart. After receiving her second dose of BMZ, patient states that she would like to return home. I discussed with her that it is our practice to monitor patient with placental abruption for 7 days and that there is a risk of significant bleeding or abruption within the next 5-7 days that could be life threatening to her and/or the baby. Patient would still like to return home. Pelvic rest precautions given. Modified bedrest recommended. Pt signed out AMA - paperwork signed.   Consults: None  Significant Diagnostic Studies: Fetal US.  Treatments: IV hydration and steroids: BMZ.  Discharge Exam: Blood pressure (!) 104/57, pulse (!) 102, temperature 99.6 F (37.6 C), temperature source Oral, resp. rate 16, height 5\' 2"  (1.575 m), weight 126 lb 8 oz (57.4 kg), last menstrual period 12/28/2016, SpO2 98 %. Exam unchanged from this morning.  Disposition: 01-Home or Self Care   Allergies as of 06/12/2017   No Known Allergies     Medication List    TAKE these medications   ferrous sulfate 325 (65 FE) MG tablet TAKE 1 TABLET (325 MG TOTAL) BY MOUTH 2 (TWO) TIMES DAILY.   prenatal multivitamin Tabs tablet Take 1 tablet by mouth daily at 12 noon.      40 minutes spent with patient, both this morning and this afternoon, coordinating care and discussing risks to patient.  Signed: Levie HeritageJacob J Stinson 06/12/2017, 5:14 PM

## 2017-06-12 NOTE — Progress Notes (Signed)
Patient ID: Caroline [name], female   DOB: February 16, 1996, 21 y.o.   MRN: [id number]  FACULTY PRACTICE ANTEPARTUM NOTE  Caroline [name] is a 21 y.o. G2P0010 at 4354w5d  who is admitted for vaginal bleeding.   Fetal presentation is breech. Length of Stay:  0  Days  Subjective: No bleeding, spotting, or brownish discharge. Patient reports good fetal movement.   She reports no uterine contractions She reports no bleeding  She reports no loss of fluid per vagina.  Vitals:  Blood pressure (!) 103/50, pulse 80, temperature 98.6 F (37 C), temperature source Oral, resp. rate 16, height 5\' 2"  (1.575 m), weight 126 lb 8 oz (57.4 kg), last menstrual period 12/28/2016, SpO2 100 %. Physical Examination:  General appearance - alert, well appearing, and in no distress Chest - clear to auscultation, no wheezes, rales or rhonchi, symmetric air entry Heart - normal rate, regular rhythm, normal S1, S2, no murmurs, rubs, clicks or gallops Abdomen - soft, nontender, nondistended, no masses or organomegaly Fundal Height:  size equals dates Extremities: extremities normal, atraumatic, no cyanosis or edema  Membranes:intact  Fetal Monitoring:  Baseline: 135 bpm, Variability: Good {> 6 bpm), Accelerations: Non-reactive but appropriate for gestational age and Decelerations: Absent  Labs:  Results for orders placed or performed during the hospital encounter of 06/11/17 (from the past 24 hour(s))  Type and screen [facility]   Collection Time: 06/11/17  1:31 PM  Result Value Ref Range   ABO/RH(D) A POS    Antibody Screen NEG    Sample Expiration 06/14/2017   CBC on admission   Collection Time: 06/11/17  1:34 PM  Result Value Ref Range   WBC 8.5 4.0 - 10.5 K/uL   RBC 4.61 3.87 - 5.11 MIL/uL   Hemoglobin 14.5 12.0 - 15.0 g/dL   HCT 40.941.0 81.136.0 - 91.446.0 %   MCV 88.9 78.0 - 100.0 fL   MCH 31.5 26.0 - 34.0 pg   MCHC 35.4 30.0 - 36.0 g/dL   RDW 78.214.3 95.611.5 - 21.315.5 %   Platelets 208  150 - 400 K/uL  ABO/Rh   Collection Time: 06/11/17  1:45 PM  Result Value Ref Range   ABO/RH(D) A POS     Imaging Studies:       Medications:  Scheduled . betamethasone acetate-betamethasone sodium phosphate  12 mg Intramuscular Q24H  . docusate sodium  100 mg Oral Daily  . prenatal vitamin w/FE, FA  1 tablet Oral Q1200  . sodium chloride flush  3 mL Intravenous Q12H   I have reviewed the patient's current medications.  ASSESSMENT: Active Problems:   Placenta previa antepartum in second trimester   PLAN: 1. Placenta previa 2. Vaginal Bleeding  Discussed inpatient monitoring x5-7 days  NST twice daily  S/p BMZ.   Continue routine antenatal care.   Levie HeritageStinson, Leroy Pettway J, DO 06/12/2017,12:04 PM

## 2017-06-20 ENCOUNTER — Ambulatory Visit (INDEPENDENT_AMBULATORY_CARE_PROVIDER_SITE_OTHER): Payer: BLUE CROSS/BLUE SHIELD | Admitting: Family Medicine

## 2017-06-20 VITALS — BP 108/71 | HR 91 | Wt 125.0 lb

## 2017-06-20 DIAGNOSIS — O4402 Placenta previa specified as without hemorrhage, second trimester: Secondary | ICD-10-CM

## 2017-06-20 DIAGNOSIS — Z3480 Encounter for supervision of other normal pregnancy, unspecified trimester: Secondary | ICD-10-CM

## 2017-06-20 DIAGNOSIS — O4692 Antepartum hemorrhage, unspecified, second trimester: Secondary | ICD-10-CM

## 2017-06-20 DIAGNOSIS — R9389 Abnormal findings on diagnostic imaging of other specified body structures: Secondary | ICD-10-CM

## 2017-06-20 DIAGNOSIS — O4690 Antepartum hemorrhage, unspecified, unspecified trimester: Secondary | ICD-10-CM | POA: Insufficient documentation

## 2017-06-20 DIAGNOSIS — Z3482 Encounter for supervision of other normal pregnancy, second trimester: Secondary | ICD-10-CM

## 2017-06-20 NOTE — Progress Notes (Signed)
Pt states she has vaginal pain and abd cramping.

## 2017-06-20 NOTE — Progress Notes (Signed)
   PRENATAL VISIT NOTE  Subjective:  Caroline [name] is a 21 y.o. G2P0010 at 6628w6d being seen today for ongoing prenatal care.  She is currently monitored for the following issues for this high-risk pregnancy and has Supervision of other normal pregnancy, antepartum; Parvovirus exposure; Radiation exposure affecting pregnancy; Abnormal ultrasound; Placenta previa antepartum in second trimester; and Vaginal bleeding during pregnancy, antepartum on her problem list.  Patient reports vaginal pain and cramping.  Contractions: Not present. Vag. Bleeding: None.  Movement: Present. Denies leaking of fluid.   The following portions of the patient's history were reviewed and updated as appropriate: allergies, current medications, past family history, past medical history, past social history, past surgical history and problem list. Problem list updated.  Objective:   Vitals:   06/20/17 0953  BP: 108/71  Pulse: 91  Weight: 125 lb (56.7 kg)    Fetal Status: Fetal Heart Rate (bpm): 139   Movement: Present     General:  Alert, oriented and cooperative. Patient is in no acute distress.  Skin: Skin is warm and dry. No rash noted.   Cardiovascular: Normal heart rate noted  Respiratory: Normal respiratory effort, no problems with respiration noted  Abdomen: Soft, gravid, appropriate for gestational age.  Pain/Pressure: Present     Pelvic: Cervical exam deferred        Extremities: Normal range of motion.  Edema: None  Mental Status:  Normal mood and affect. Normal behavior. Normal judgment and thought content.   Assessment and Plan:  Pregnancy: G2P0010 at 8028w6d  1. Placenta previa antepartum in second trimester - US MFM OB Transvaginal; Future - US MFM OB FOLLOW UP; Future  2. Vaginal bleeding during pregnancy, antepartum No bleeding since hospital  3. Supervision of other normal pregnancy, antepartum UTD currently  4. Abnormal ultrasound Discussed NIPT at any point in pregnancy is  desired   Preterm labor symptoms and general obstetric precautions including but not limited to vaginal bleeding, contractions, leaking of fluid and fetal movement were reviewed in detail with the patient. Please refer to After Visit Summary for other counseling recommendations.  Return in about 4 weeks (around 07/18/2017) for Routine prenatal care.   Federico FlakeKimberly Niles Newton, MD

## 2017-07-04 ENCOUNTER — Ambulatory Visit (HOSPITAL_COMMUNITY)
Admission: RE | Admit: 2017-07-04 | Discharge: 2017-07-04 | Disposition: A | Discharge status: Home / Self Care | Payer: BLUE CROSS/BLUE SHIELD | Source: Ambulatory Visit | Attending: Family Medicine | Admitting: Family Medicine

## 2017-07-04 ENCOUNTER — Ambulatory Visit (HOSPITAL_COMMUNITY): Payer: BLUE CROSS/BLUE SHIELD

## 2017-07-04 DIAGNOSIS — Z3A26 26 weeks gestation of pregnancy: Secondary | ICD-10-CM | POA: Insufficient documentation

## 2017-07-04 DIAGNOSIS — O4402 Placenta previa specified as without hemorrhage, second trimester: Secondary | ICD-10-CM

## 2017-07-04 DIAGNOSIS — O359XX0 Maternal care for (suspected) fetal abnormality and damage, unspecified, not applicable or unspecified: Secondary | ICD-10-CM | POA: Insufficient documentation

## 2017-07-20 ENCOUNTER — Ambulatory Visit (INDEPENDENT_AMBULATORY_CARE_PROVIDER_SITE_OTHER): Payer: BLUE CROSS/BLUE SHIELD | Admitting: Obstetrics and Gynecology

## 2017-07-20 VITALS — BP 91/58 | HR 92 | Wt 137.0 lb

## 2017-07-20 DIAGNOSIS — O4402 Placenta previa specified as without hemorrhage, second trimester: Secondary | ICD-10-CM

## 2017-07-20 DIAGNOSIS — Z23 Encounter for immunization: Secondary | ICD-10-CM

## 2017-07-20 DIAGNOSIS — O4403 Placenta previa specified as without hemorrhage, third trimester: Secondary | ICD-10-CM

## 2017-07-20 DIAGNOSIS — R9389 Abnormal findings on diagnostic imaging of other specified body structures: Secondary | ICD-10-CM

## 2017-07-20 DIAGNOSIS — O0993 Supervision of high risk pregnancy, unspecified, third trimester: Secondary | ICD-10-CM

## 2017-07-20 DIAGNOSIS — Z3483 Encounter for supervision of other normal pregnancy, third trimester: Secondary | ICD-10-CM

## 2017-07-20 DIAGNOSIS — Z20828 Contact with and (suspected) exposure to other viral communicable diseases: Secondary | ICD-10-CM

## 2017-07-20 DIAGNOSIS — Z3480 Encounter for supervision of other normal pregnancy, unspecified trimester: Secondary | ICD-10-CM

## 2017-07-20 DIAGNOSIS — B3731 Acute candidiasis of vulva and vagina: Secondary | ICD-10-CM

## 2017-07-20 DIAGNOSIS — B373 Candidiasis of vulva and vagina: Secondary | ICD-10-CM

## 2017-07-20 MED ORDER — FLUCONAZOLE 150 MG PO TABS: ORAL_TABLET | ORAL | 0 refills | Status: DC

## 2017-07-20 NOTE — Progress Notes (Signed)
Prenatal Visit Note Date: 07/20/2017 Clinic: [facility]  Subjective:  Caroline [name] is a 21 y.o. G2P0010 at [redacted]w[redacted]d being seen today for ongoing prenatal care.  She is currently monitored for the following issues for this high-risk pregnancy and has Supervision of high risk pregnancy, antepartum, third trimester; Parvovirus exposure; Radiation exposure affecting pregnancy; Abnormal ultrasound; and Placenta previa antepartum in second trimester on her problem list.  Patient reports vaginal irritation.  Didn't want to try anything OTC given the previa.  Contractions: Not present. Vag. Bleeding: None.  Movement: Present. Denies leaking of fluid.   The following portions of the patient's history were reviewed and updated as appropriate: allergies, current medications, past family history, past medical history, past social history, past surgical history and problem list. Problem list updated.  Objective:   Vitals:   07/20/17 0823  BP: (!) 91/58  Pulse: 92  Weight: 137 lb (62.1 kg)    Fetal Status: Fetal Heart Rate (bpm): 135 Fundal Height: 29 cm Movement: Present     General:  Alert, oriented and cooperative. Patient is in no acute distress.  Skin: Skin is warm and dry. No rash noted.   Cardiovascular: Normal heart rate noted  Respiratory: Normal respiratory effort, no problems with respiration noted  Abdomen: Soft, gravid, appropriate for gestational age. Pain/Pressure: Present     Pelvic:  EGBUS with slight irritation. White cottage cheese like d/c at the vault  Extremities: Normal range of motion.  Edema: None  Mental Status: Normal mood and affect. Normal behavior. Normal judgment and thought content.   Urinalysis:      Assessment and Plan:  Pregnancy: G2P0010 at [redacted]w[redacted]d  1. Supervision of other normal pregnancy, antepartum Routine care - HIV antibody - Glucose Tolerance, 2 Hours w/1 Hour - CBC - RPR - [place] MFM OB Transvaginal; Future -  Parvovirus B19 antibody, IgG and IgM  2. Placenta previa antepartum in second trimester For mid sept. Pelvic rest and ER precautions given - [place] MFM OB Transvaginal; Future - [place] MFM OB FOLLOW UP; Future  3. Parvovirus exposure Rpt labs. Negative back at initial exposure.  - Parvovirus B19 antibody, IgG and IgM  4. Vulvovaginal candidiasis diflucan  5. Abnormal ultrasound - [place] MFM OB FOLLOW UP; Future   Preterm labor symptoms and general obstetric precautions including but not limited to vaginal bleeding, contractions, leaking of fluid and fetal movement were reviewed in detail with the patient. Please refer to After Visit Summary for other counseling recommendations.  Return in about 2 weeks (around 08/03/2017).   [employer], MD

## 2017-07-21 LAB — CBC
Hematocrit: 39.4 % (ref 34.0–46.6)
Hemoglobin: 13.1 g/dL (ref 11.1–15.9)
MCH: 30.1 pg (ref 26.6–33.0)
MCHC: 33.2 g/dL (ref 31.5–35.7)
MCV: 91 fL (ref 79–97)
PLATELETS: 206 10*3/uL (ref 150–379)
RBC: 4.35 x10E6/uL (ref 3.77–5.28)
RDW: 14.1 % (ref 12.3–15.4)
WBC: 7.6 10*3/uL (ref 3.4–10.8)

## 2017-07-21 LAB — RPR: RPR Ser Ql: NONREACTIVE

## 2017-07-21 LAB — GLUCOSE TOLERANCE, 2 HOURS W/ 1HR
GLUCOSE, 1 HOUR: 142 mg/dL (ref 65–179)
GLUCOSE, 2 HOUR: 129 mg/dL (ref 65–152)
GLUCOSE, FASTING: 83 mg/dL (ref 65–91)

## 2017-07-21 LAB — HIV ANTIBODY (ROUTINE TESTING W REFLEX): HIV Screen 4th Generation wRfx: NONREACTIVE

## 2017-07-24 LAB — PARVOVIRUS B19 ANTIBODY, IGG AND IGM
Parvovirus B19 IgG: 0.2 index (ref 0.0–0.8)
Parvovirus B19 IgM: 0.1 index (ref 0.0–0.8)

## 2017-07-31 ENCOUNTER — Ambulatory Visit (INDEPENDENT_AMBULATORY_CARE_PROVIDER_SITE_OTHER): Payer: BLUE CROSS/BLUE SHIELD | Admitting: Obstetrics and Gynecology

## 2017-07-31 ENCOUNTER — Ambulatory Visit (HOSPITAL_COMMUNITY)
Admission: RE | Admit: 2017-07-31 | Discharge: 2017-07-31 | Disposition: A | Discharge status: Home / Self Care | Payer: BLUE CROSS/BLUE SHIELD | Source: Ambulatory Visit | Attending: Obstetrics and Gynecology | Admitting: Obstetrics and Gynecology

## 2017-07-31 VITALS — BP 101/67 | HR 99 | Wt 140.0 lb

## 2017-07-31 DIAGNOSIS — O4403 Placenta previa specified as without hemorrhage, third trimester: Secondary | ICD-10-CM | POA: Diagnosis present

## 2017-07-31 DIAGNOSIS — O0993 Supervision of high risk pregnancy, unspecified, third trimester: Secondary | ICD-10-CM

## 2017-07-31 DIAGNOSIS — O09893 Supervision of other high risk pregnancies, third trimester: Secondary | ICD-10-CM | POA: Diagnosis present

## 2017-07-31 DIAGNOSIS — Z3A30 30 weeks gestation of pregnancy: Secondary | ICD-10-CM | POA: Insufficient documentation

## 2017-07-31 DIAGNOSIS — O4402 Placenta previa specified as without hemorrhage, second trimester: Secondary | ICD-10-CM

## 2017-07-31 DIAGNOSIS — O359XX0 Maternal care for (suspected) fetal abnormality and damage, unspecified, not applicable or unspecified: Secondary | ICD-10-CM | POA: Insufficient documentation

## 2017-07-31 DIAGNOSIS — R938 Abnormal findings on diagnostic imaging of other specified body structures: Secondary | ICD-10-CM

## 2017-07-31 DIAGNOSIS — Z3480 Encounter for supervision of other normal pregnancy, unspecified trimester: Secondary | ICD-10-CM

## 2017-07-31 DIAGNOSIS — R9389 Abnormal findings on diagnostic imaging of other specified body structures: Secondary | ICD-10-CM

## 2017-07-31 MED ORDER — BREAST PUMP MISC: 1.0000 [IU] | 0 refills | Status: DC | PRN

## 2017-07-31 NOTE — Progress Notes (Signed)
Prenatal Visit Note Date: 07/31/2017 Clinic: [facility]  Subjective:  Caroline [name] is a 21 y.o. G2P0010 at 3572w5d being seen today for ongoing prenatal care.  She is currently monitored for the following issues for this high-risk pregnancy and has Supervision of high risk pregnancy, antepartum, third trimester; Parvovirus exposure; Radiation exposure affecting pregnancy; Abnormal ultrasound; and Placenta previa antepartum in second trimester on her problem list.  Patient reports no complaints.   Contractions: Not present. Vag. Bleeding: None.  Movement: Present. Denies leaking of fluid.   The following portions of the patient's history were reviewed and updated as appropriate: allergies, current medications, past family history, past medical history, past social history, past surgical history and problem list. Problem list updated.  Objective:   Vitals:   07/31/17 1312  BP: 101/67  Pulse: 99  Weight: 140 lb (63.5 kg)    Fetal Status: Fetal Heart Rate (bpm): 143   Movement: Present     General:  Alert, oriented and cooperative. Patient is in no acute distress.  Skin: Skin is warm and dry. No rash noted.   Cardiovascular: Normal heart rate noted  Respiratory: Normal respiratory effort, no problems with respiration noted  Abdomen: Soft, gravid, appropriate for gestational age. Pain/Pressure: Present     Pelvic:  Cervical exam deferred        Extremities: Normal range of motion.  Edema: None  Mental Status: Normal mood and affect. Normal behavior. Normal judgment and thought content.   Urinalysis:      Assessment and Plan:  Pregnancy: G2P0010 at 7472w5d  1. Supervision of high risk pregnancy, antepartum, third trimester Routine care  2. Placenta previa antepartum in second trimester Rpt u/s today  3. Abnormal ultrasound Has rpt growth in two weeks  Preterm labor symptoms and general obstetric precautions including but not limited to vaginal  bleeding, contractions, leaking of fluid and fetal movement were reviewed in detail with the patient. Please refer to After Visit Summary for other counseling recommendations.  Return in about 2 weeks (around 08/14/2017) for rob.   Thor BingPickens, Abas Leicht, MD

## 2017-08-02 ENCOUNTER — Inpatient Hospital Stay (HOSPITAL_COMMUNITY)
Admission: AD | Admit: 2017-08-02 | Discharge: 2017-08-13 | DRG: 765 | Disposition: A | Discharge status: Home / Self Care | Payer: BLUE CROSS/BLUE SHIELD | Source: Ambulatory Visit | Attending: Obstetrics and Gynecology | Admitting: Obstetrics and Gynecology

## 2017-08-02 ENCOUNTER — Encounter (HOSPITAL_COMMUNITY): Payer: Self-pay

## 2017-08-02 DIAGNOSIS — Z3483 Encounter for supervision of other normal pregnancy, third trimester: Secondary | ICD-10-CM | POA: Diagnosis not present

## 2017-08-02 DIAGNOSIS — O4413 Placenta previa with hemorrhage, third trimester: Secondary | ICD-10-CM | POA: Diagnosis not present

## 2017-08-02 DIAGNOSIS — O4403 Placenta previa specified as without hemorrhage, third trimester: Secondary | ICD-10-CM | POA: Diagnosis present

## 2017-08-02 DIAGNOSIS — Z3A31 31 weeks gestation of pregnancy: Secondary | ICD-10-CM

## 2017-08-02 DIAGNOSIS — Z3A32 32 weeks gestation of pregnancy: Secondary | ICD-10-CM | POA: Diagnosis not present

## 2017-08-02 DIAGNOSIS — O4690 Antepartum hemorrhage, unspecified, unspecified trimester: Secondary | ICD-10-CM

## 2017-08-02 LAB — CBC
HCT: 36.3 % (ref 36.0–46.0)
Hemoglobin: 13 g/dL (ref 12.0–15.0)
MCH: 31.2 pg (ref 26.0–34.0)
MCHC: 35.8 g/dL (ref 30.0–36.0)
MCV: 87.1 fL (ref 78.0–100.0)
PLATELETS: 180 10*3/uL (ref 150–400)
RBC: 4.17 MIL/uL (ref 3.87–5.11)
RDW: 13.8 % (ref 11.5–15.5)
WBC: 8 10*3/uL (ref 4.0–10.5)

## 2017-08-02 LAB — URINALYSIS, MICROSCOPIC (REFLEX)

## 2017-08-02 LAB — URINALYSIS, ROUTINE W REFLEX MICROSCOPIC
BILIRUBIN URINE: NEGATIVE
Glucose, UA: NEGATIVE mg/dL
KETONES UR: NEGATIVE mg/dL
NITRITE: NEGATIVE
PH: 6.5 (ref 5.0–8.0)
Protein, ur: 100 mg/dL — AB
Specific Gravity, Urine: <1.005 — ABNORMAL LOW (ref 1.005–1.030)

## 2017-08-02 LAB — PREPARE RBC (CROSSMATCH)

## 2017-08-02 MED ORDER — ACETAMINOPHEN 325 MG PO TABS
650.0000 mg | ORAL_TABLET | ORAL | Status: DC | PRN
  Administered 2017-08-09: 650 mg via ORAL

## 2017-08-02 MED ORDER — NIFEDIPINE 10 MG PO CAPS
20.0000 mg | ORAL_CAPSULE | Freq: Once | ORAL | Status: AC
  Administered 2017-08-02: 20 mg via ORAL
  Filled 2017-08-02: qty 2

## 2017-08-02 MED ORDER — NIFEDIPINE 10 MG PO CAPS
10.0000 mg | ORAL_CAPSULE | Freq: Four times a day (QID) | ORAL | Status: DC
  Administered 2017-08-02 – 2017-08-11 (×35): 10 mg via ORAL
  Filled 2017-08-02 (×35): qty 1

## 2017-08-02 MED ORDER — COMPLETENATE 29-1 MG PO CHEW
1.0000 | CHEWABLE_TABLET | Freq: Every day | ORAL | Status: DC
  Administered 2017-08-03 – 2017-08-10 (×8): 1 via ORAL
  Filled 2017-08-02 (×9): qty 1

## 2017-08-02 MED ORDER — CALCIUM CARBONATE ANTACID 500 MG PO CHEW: 2.0000 | CHEWABLE_TABLET | ORAL | Status: DC | PRN

## 2017-08-02 MED ORDER — BETAMETHASONE SOD PHOS & ACET 6 (3-3) MG/ML IJ SUSP
12.0000 mg | INTRAMUSCULAR | Status: AC
  Administered 2017-08-02 – 2017-08-03 (×2): 12 mg via INTRAMUSCULAR
  Filled 2017-08-02 (×2): qty 2

## 2017-08-02 MED ORDER — ZOLPIDEM TARTRATE 5 MG PO TABS: 5.0000 mg | ORAL_TABLET | Freq: Every evening | ORAL | Status: DC | PRN

## 2017-08-02 MED ORDER — PRENATAL MULTIVITAMIN CH
1.0000 | ORAL_TABLET | Freq: Every day | ORAL | Status: DC
  Administered 2017-08-02: 1 via ORAL
  Filled 2017-08-02: qty 1

## 2017-08-02 MED ORDER — DOCUSATE SODIUM 100 MG PO CAPS
100.0000 mg | ORAL_CAPSULE | Freq: Every day | ORAL | Status: DC
  Administered 2017-08-02 – 2017-08-04 (×3): 100 mg via ORAL
  Filled 2017-08-02 (×4): qty 1

## 2017-08-02 MED ORDER — LACTATED RINGERS IV SOLN
INTRAVENOUS | Status: DC
  Administered 2017-08-02: 04:00:00 via INTRAVENOUS

## 2017-08-02 MED ORDER — SODIUM CHLORIDE 0.9 % IV SOLN: Freq: Once | INTRAVENOUS | Status: DC

## 2017-08-02 NOTE — MAU Note (Signed)
Patient with a confirmed posterior previa w/ hx of bleeding episode at 23 weeks-was admitted to ante then.  Just started bleeding again today, soaked through 1 pad on the way to MAU.  Also having cramping which she has had throughout the pregnancy.

## 2017-08-02 NOTE — H&P (Addendum)
Caroline [name] is a 21 y.o. female presenting for vaginal bleeding which started spontaneously tonight. Got up to urinate and about an hour later felt wetness and saw blood.  No pain.  Soaked a large pad on way to hospital. Persistent posterior previa reaffirmed by [place] 2 days ago.  Had Betamethasone at 23 weeks  Patient Active Problem List   Diagnosis Date Noted  . Placenta previa antepartum in third trimester 08/02/2017  . Placenta previa antepartum in second trimester 06/11/2017  . Abnormal ultrasound 05/21/2017  . Supervision of high risk pregnancy, antepartum, third trimester 03/26/2017  . Parvovirus exposure 03/26/2017  . Radiation exposure affecting pregnancy 03/26/2017    . OB History    Gravida Para Term Preterm AB Living   2 0 0 0 1 0   SAB TAB Ectopic Multiple Live Births   1 0 0 0 0     Past Medical History:  Diagnosis Date  . Medical history non-contributory    Past Surgical History:  Procedure Laterality Date  . NO PAST SURGERIES     Family History: family history includes Diabetes in her paternal grandfather; Drug abuse in her maternal grandmother and mother; Hypertension in her paternal grandfather; Kidney failure in her mother; Lung cancer in her paternal grandfather. Social History:  reports that she has never smoked. She has never used smokeless tobacco. She reports that she does not drink alcohol or use drugs.     Maternal Diabetes: No Genetic Screening: Declined Maternal Ultrasounds/Referrals: Abnormal:  Findings:   Isolated EIF (echogenic intracardiac focus), Absent nasal bone, Other: hypoplastic phalanges, 5th digit Fetal Ultrasounds or other Referrals:  Referred to Materal Fetal Medicine  Maternal Substance Abuse:  No Significant Maternal Medications:  None Significant Maternal Lab Results:  None Other Comments:  Placenta previa  Review of Systems  Constitutional: Negative for chills, fever and malaise/fatigue.  Gastrointestinal: Negative  for abdominal pain, constipation, diarrhea, nausea and vomiting.  Genitourinary: Negative for dysuria.       Vaginal bleeding    Maternal Medical History:  Reason for admission: Vaginal bleeding.  Nausea.  Contractions: Onset was less than 1 hour ago.   Perceived severity is mild.    Fetal activity: Perceived fetal activity is normal.   Last perceived fetal movement was within the past hour.    Prenatal complications: Bleeding and placental abnormality.   No PIH, pre-eclampsia, preterm labor or substance abuse.   Prenatal Complications - Diabetes: none.      Blood pressure 123/75, pulse 97, temperature 98 F (36.7 C), temperature source Oral, resp. rate 18, last menstrual period 12/28/2016. Maternal Exam:  Uterine Assessment: Contraction strength is mild.  Contraction frequency is irregular.   Abdomen: Patient reports no abdominal tenderness. Introitus: Normal vulva. Vagina is positive for vaginal discharge.  Ferning test: not done.  Nitrazine test: not done. Amniotic fluid character: not assessed.     Fetal Exam Fetal Monitor Review: Mode: ultrasound.   Baseline rate: 140.  Variability: moderate (6-25 bpm).   Pattern: accelerations present and no decelerations.    Fetal State Assessment: Category I - tracings are normal.     Physical Exam  Constitutional: She is oriented to person, place, and time. She appears well-developed and well-nourished. No distress.  HENT:  Head: Normocephalic.  Cardiovascular: Normal rate and regular rhythm.   Respiratory: Effort normal. No respiratory distress.  GI: Soft. She exhibits no distension. There is no tenderness. There is no rebound and no guarding.  Genitourinary: Vaginal discharge found.  Genitourinary Comments: 3-4cm bright red blood on pad   Musculoskeletal: Normal range of motion.  Neurological: She is alert and oriented to person, place, and time.  Skin: Skin is warm and dry.  Psychiatric: She has a normal mood and  affect.    Prenatal labs: ABO, Rh: --/--/A POS (07/16 1345) Antibody: NEG (07/16 1331) Rubella: 3.95 (04/30 1530) RPR: Non Reactive (08/24 0811)  HBsAg: Negative (04/30 1530)  HIV:    GBS:     Assessment/Plan: Single IUP at 6343w0d Posterior placenta previa Vaginal bleeding  Admit to Antenatal Repeat Betamethasone series Procardia for tocolysis Type and Cross 2 units    Wynelle BourgeoisMarie Williams 08/02/2017, 3:26 AM

## 2017-08-02 NOTE — Progress Notes (Signed)
Dr. Emelda FearFerguson notified pt passed a bld clot 1 1/2 in by 1 1/2 in and continue to have bright red bleeding bld loss of 18 ml of bld.   bld work reviewed.  Continue to monitor

## 2017-08-02 NOTE — Progress Notes (Signed)
FACULTY PRACTICE ANTEPARTUM(COMPREHENSIVE) NOTE  Caroline BlaseVictoria R [name] is a 21 y.o. G2P0010 at 7040w0d by early ultrasound who is admitted for bleeding with posterior plac previa.   Fetal presentation is cephalic. Length of Stay:  0  Days    Subjective: Pt had one clot passed after arrival to 3rd floor , none since. Cramping has resolved Patient reports the fetal movement as active. Patient reports uterine contraction  activity as none. Patient reports  vaginal bleeding as spotting. Patient describes fluid per vagina as None.  Vitals:  Blood pressure 113/65, pulse 98, temperature 99 F (37.2 C), temperature source Oral, resp. rate 18, height 5\' 2"  (1.575 m), weight 140 lb 12.8 oz (63.9 kg), last menstrual period 12/28/2016, SpO2 100 %. Physical Examination:  General appearance - alert, well appearing, and in no distress Heart - normal rate and regular rhythm Abdomen - soft, nontender, nondistended Fundal Height:  size equals dates Cervical Exam: Not evaluated. a and fetal presentation is cephalic.per u/s Extremities: extremities normal, atraumatic, no cyanosis or edema and Homans sign is negative, no sign of DVT with DTRs 2+ bilaterally Membranes:intact  Fetal Monitoring:  Baseline: 120 bpm, Variability: Fair (1-6 bpm), Accelerations: Non-reactive but appropriate for gestational age and Decelerations: Absent  Labs:  Results for orders placed or performed during the hospital encounter of 08/02/17 (from the past 24 hour(s))  Urinalysis, Routine w reflex microscopic   Collection Time: 08/02/17  3:07 AM  Result Value Ref Range   Color, Urine RED (A) YELLOW   APPearance HAZY (A) CLEAR   Specific Gravity, Urine <1.005 (L) 1.005 - 1.030   pH 6.5 5.0 - 8.0   Glucose, UA NEGATIVE NEGATIVE mg/dL   Hgb urine dipstick LARGE (A) NEGATIVE   Bilirubin Urine NEGATIVE NEGATIVE   Ketones, ur NEGATIVE NEGATIVE mg/dL   Protein, ur 161100 (A) NEGATIVE mg/dL   Nitrite NEGATIVE NEGATIVE   Leukocytes,  UA SMALL (A) NEGATIVE  Urinalysis, Microscopic (reflex)   Collection Time: 08/02/17  3:07 AM  Result Value Ref Range   RBC / HPF TOO NUMEROUS TO COUNT 0 - 5 RBC/hpf   WBC, UA 6-30 0 - 5 WBC/hpf   Bacteria, UA FEW (A) NONE SEEN   Squamous Epithelial / LPF 0-5 (A) NONE SEEN  CBC on admission   Collection Time: 08/02/17  3:22 AM  Result Value Ref Range   WBC 8.0 4.0 - 10.5 K/uL   RBC 4.17 3.87 - 5.11 MIL/uL   Hemoglobin 13.0 12.0 - 15.0 g/dL   HCT 09.636.3 04.536.0 - 40.946.0 %   MCV 87.1 78.0 - 100.0 fL   MCH 31.2 26.0 - 34.0 pg   MCHC 35.8 30.0 - 36.0 g/dL   RDW 81.113.8 91.411.5 - 78.215.5 %   Platelets 180 150 - 400 K/uL  Type and screen [facility]   Collection Time: 08/02/17  3:22 AM  Result Value Ref Range   ABO/RH(D) A POS    Antibody Screen NEG    Sample Expiration 08/05/2017    Unit Number [id number]    Blood Component Type RED CELLS,LR    Unit division 00    Status of Unit ALLOCATED    Transfusion Status OK TO TRANSFUSE    Crossmatch Result Compatible    Unit Number [id number]    Blood Component Type RED CELLS,LR    Unit division 00    Status of Unit ALLOCATED    Transfusion Status OK TO TRANSFUSE    Crossmatch Result Compatible   BPAM RBC  Collection Time: 08/02/17  3:22 AM  Result Value Ref Range   Blood Product Unit Number [id number]    Unit Type and Rh 6200    Blood Product Expiration Date [id number]    Blood Product Unit Number [id number]    Unit Type and Rh 6200    Blood Product Expiration Date [id number]   Prepare RBC   Collection Time: 08/02/17  4:00 AM  Result Value Ref Range   Order Confirmation ORDER PROCESSED BY BLOOD BANK     Imaging Studies:    See u/s pic above from 9/4.  Medications:  Scheduled . betamethasone acetate-betamethasone sodium phosphate  12 mg Intramuscular Q24 Hr x 2  . docusate sodium  100 mg Oral Daily  . NIFEdipine  10 mg Oral Q6H  . prenatal multivitamin  1 tablet Oral Q1200   I have reviewed the  patient's current medications.  ASSESSMENT: Patient Active Problem List   Diagnosis Date Noted  . Placenta previa antepartum in third trimester 08/02/2017  . Placenta previa antepartum in second trimester 06/11/2017  . Abnormal ultrasound 05/21/2017  . Supervision of high risk pregnancy, antepartum, third trimester 03/26/2017  . Parvovirus exposure 03/26/2017  . Radiation exposure affecting pregnancy 03/26/2017    PLAN: D/c procardia  As irritability gone. Inpatient x 7d  Complete second BMZ course tomorrow 3 am Will need scheduling for primary c/s at 36wk prior to d/c  [facility] V 08/02/2017,7:17 AM    Patient ID: Caroline [name], female   DOB: [birth year], 21 y.o.   MRN: [id number]

## 2017-08-03 NOTE — Progress Notes (Signed)
Patient ID: Caroline [name], female   DOB: Dec 29, 1995, 21 y.o.   MRN: [id number] FACULTY PRACTICE ANTEPARTUM(COMPREHENSIVE) NOTE  Caroline [name] is a 21 y.o. G2P0010 at 4228w1d  who is admitted for thrid trimester vaginal bleeding related to a posterior placenta previa.    Fetal presentation is cephalic. Length of Stay:  1  Days  Date of admission:08/02/2017  Subjective: Patient reports persistent bright red/pink vaginal bleeding, decreased in comparison to admission. She reports some mild lower abdominal cramping Patient reports the fetal movement as active. Patient reports uterine contraction  activity as none. Patient describes fluid per vagina as None.  Vitals:  Blood pressure (!) 110/50, pulse 95, temperature 98.4 F (36.9 C), temperature source Oral, resp. rate 20, height 5\' 2"  (1.575 m), weight 140 lb 12.8 oz (63.9 kg), last menstrual period 12/28/2016, SpO2 100 %. Vitals:   08/02/17 1600 08/02/17 2000 08/03/17 0009 08/03/17 0631  BP: 111/63 120/60 (!) 110/56 (!) 110/50  Pulse: 95 96 (!) 103 95  Resp: 16 18 18 20   Temp: 98.6 F (37 C) 99 F (37.2 C) 98 F (36.7 C) 98.4 F (36.9 C)  TempSrc: Oral Oral  Oral  SpO2: 98% 99% 99% 100%  Weight:      Height:       Physical Examination:  General appearance - alert, well appearing, and in no distress Abdomen - soft, gravid, non tender Fundal Height:  size equals dates Pelvic Exam:  examination not indicated Cervical Exam: Not evaluated.  Extremities: extremities normal, atraumatic, no cyanosis or edema and Homans sign is negative, no sign of DVT with DTRs 2+ bilaterally Membranes:intact  Fetal Monitoring:  Baseline: 135 bpm, Variability: Good {> 6 bpm), Accelerations: Reactive and Decelerations: Absent   reactive Toco: no contractions   Labs:  No results found for this or any previous visit (from the past 24 hour(s)).  Imaging Studies:    Currently EPIC will not allow sonographic studies to automatically  populate into notes.  In the meantime, copy and paste results into note or free text.  Medications:  Scheduled . docusate sodium  100 mg Oral Daily  . NIFEdipine  10 mg Oral Q6H  . prenatal vitamin w/FE, FA  1 tablet Oral Q1200   I have reviewed the patient's current medications.  ASSESSMENT: G2P0010 5828w1d Estimated Date of Delivery: 10/04/17  Patient Active Problem List   Diagnosis Date Noted  . Placenta previa antepartum in third trimester 08/02/2017  . Placenta previa antepartum in second trimester 06/11/2017  . Abnormal ultrasound 05/21/2017  . Supervision of high risk pregnancy, antepartum, third trimester 03/26/2017  . Parvovirus exposure 03/26/2017  . Radiation exposure affecting pregnancy 03/26/2017    PLAN: Patient with bleeding placenta previa Continue inpatient observation for 7 days without vaginal bleeding Patient completed BMZ Continue current care  Caroline [name] 08/03/2017,7:33 AM

## 2017-08-04 DIAGNOSIS — O4403 Placenta previa specified as without hemorrhage, third trimester: Secondary | ICD-10-CM

## 2017-08-04 NOTE — Progress Notes (Signed)
FACULTY PRACTICE ANTEPARTUM(COMPREHENSIVE) NOTE  Caroline [name] is a 21 y.o. G2P0010 at 2175w2d who is admitted for induction of labor due to bleeding with pla..   Fetal presentation is cephalic. Length of Stay:  2  Days  Subjective: Saw some red blood this am but it is not increased Patient reports the fetal movement as active. Patient reports uterine contraction  activity as none. Patient reports  vaginal bleeding as spotting. Patient describes fluid per vagina as None.  Vitals:  Blood pressure (!) 109/49, pulse (!) 102, temperature 98.1 F (36.7 C), temperature source Oral, resp. rate 18, height 5\' 2"  (1.575 m), weight 140 lb 12.8 oz (63.9 kg), last menstrual period 12/28/2016, SpO2 98 %. Physical Examination:  General appearance - alert, well appearing, and in no distress Heart - normal rate and regular rhythm Abdomen - soft, nontender, nondistended Fundal Height:  size equals dates Cervical Exam: Not evaluated.Extremities: extremities normal, atraumatic, no cyanosis or edema and Homans sign is negative, no sign of DVT  Membranes:intact  Fetal Monitoring:     Fetal Heart Rate A  Mode External filed at 08/03/2017 2112  Baseline Rate (A) 150 bpm filed at 08/03/2017 2112  Variability 6-25 BPM filed at 08/03/2017 2112  Accelerations 15 x 15 filed at 08/03/2017 2112  Decelerations None filed at 08/03/2017 2112     Labs:  No results found for this or any previous visit (from the past 24 hour(s)).  t.  Medications:  Scheduled . docusate sodium  100 mg Oral Daily  . NIFEdipine  10 mg Oral Q6H  . prenatal vitamin w/FE, FA  1 tablet Oral Q1200   I have reviewed the patient's current medications.  ASSESSMENT: Patient Active Problem List   Diagnosis Date Noted  . Placenta previa antepartum in third trimester 08/02/2017  . Placenta previa antepartum in second trimester 06/11/2017  . Abnormal ultrasound 05/21/2017  . Supervision of high risk pregnancy, antepartum, third  trimester 03/26/2017  . Parvovirus exposure 03/26/2017  . Radiation exposure affecting pregnancy 03/26/2017    PLAN: Patient with bleeding placenta previa Continue inpatient observation for 7 days without vaginal bleeding Patient completed BMZ Continue current care  Scheryl DarterJames Tanai Bouler 08/04/2017,10:40 AM

## 2017-08-05 MED ORDER — DOCUSATE SODIUM 100 MG PO CAPS
100.0000 mg | ORAL_CAPSULE | Freq: Two times a day (BID) | ORAL | Status: DC | PRN
  Administered 2017-08-05 – 2017-08-07 (×3): 100 mg via ORAL
  Filled 2017-08-05 (×2): qty 1

## 2017-08-05 NOTE — Progress Notes (Signed)
Patient ID: Caroline [name], female   DOB: 03/26/1996, 21 y.o.   MRN: [id number] FACULTY PRACTICE ANTEPARTUM(COMPREHENSIVE) NOTE  Caroline [name] is a 21 y.o. G2P0010 at 78110w3d  who is admitted for placenta previa with bleeding.   Fetal presentation is cephalic. Length of Stay:  3  Days  Subjective: Posterior previa Patient reports the fetal movement as active. Patient reports uterine contraction  activity as none. Patient reports  vaginal bleeding as scant staining. Patient describes fluid per vagina as None.  Vitals:  Blood pressure 108/60, pulse 87, temperature 98.2 F (36.8 C), temperature source Oral, resp. rate 17, height 5\' 2"  (1.575 m), weight 63.9 kg (140 lb 12.8 oz), last menstrual period 12/28/2016, SpO2 98 %. Physical Examination:  General appearance - alert, well appearing, and in no distress Heart - normal rate and regular rhythm Abdomen - soft, nontender, nondistended Fundal Height:  size equals dates Cervical Exam: Not evaluated. Extremities: extremities normal, atraumatic, no cyanosis or edema and Homans sign is negative, no sign of DVT  Membranes:intact  Fetal Monitoring:   Fetal Heart Rate A  Mode External filed at 08/04/2017 2244  Baseline Rate (A) 150 bpm filed at 08/04/2017 2244  Variability 6-25 BPM filed at 08/04/2017 2244  Accelerations 15 x 15 filed at 08/04/2017 2244  Decelerations None filed at 08/04/2017 2244     Labs:  No results found for this or any previous visit (from the past 24 hour(s)).    Medications:  Scheduled . docusate sodium  100 mg Oral Daily  . NIFEdipine  10 mg Oral Q6H  . prenatal vitamin w/FE, FA  1 tablet Oral Q1200   I have reviewed the patient's current medications.  ASSESSMENT: Patient Active Problem List   Diagnosis Date Noted  . Placenta previa antepartum in third trimester 08/02/2017  . Placenta previa antepartum in second trimester 06/11/2017  . Abnormal ultrasound 05/21/2017  . Supervision of high  risk pregnancy, antepartum, third trimester 03/26/2017  . Parvovirus exposure 03/26/2017  . Radiation exposure affecting pregnancy 03/26/2017    PLAN: Patient with bleeding placenta previa Continue inpatient observation for 7 days without vaginal bleeding Patient completed BMZ Continue current care  Scheryl DarterJames Waylyn Tenbrink 08/05/2017,7:22 AM

## 2017-08-05 NOTE — Progress Notes (Signed)
Patient reports scant dark brown drainage on toilet tissue when she wipes.

## 2017-08-06 ENCOUNTER — Encounter (HOSPITAL_COMMUNITY): Payer: Self-pay

## 2017-08-06 ENCOUNTER — Encounter (INDEPENDENT_AMBULATORY_CARE_PROVIDER_SITE_OTHER): Payer: BLUE CROSS/BLUE SHIELD | Admitting: *Deleted

## 2017-08-06 DIAGNOSIS — Z3483 Encounter for supervision of other normal pregnancy, third trimester: Secondary | ICD-10-CM

## 2017-08-06 LAB — BPAM RBC
Blood Product Expiration Date: 201809252359
Blood Product Expiration Date: 201809252359
UNIT TYPE AND RH: 6200
Unit Type and Rh: 6200

## 2017-08-06 LAB — TYPE AND SCREEN
ABO/RH(D): A POS
ABO/RH(D): A POS
ANTIBODY SCREEN: NEGATIVE
Antibody Screen: NEGATIVE
UNIT DIVISION: 0
UNIT DIVISION: 0

## 2017-08-06 LAB — CBC
HEMATOCRIT: 34.9 % — AB (ref 36.0–46.0)
HEMOGLOBIN: 12.1 g/dL (ref 12.0–15.0)
MCH: 30.8 pg (ref 26.0–34.0)
MCHC: 34.7 g/dL (ref 30.0–36.0)
MCV: 88.8 fL (ref 78.0–100.0)
Platelets: 210 10*3/uL (ref 150–400)
RBC: 3.93 MIL/uL (ref 3.87–5.11)
RDW: 13.9 % (ref 11.5–15.5)
WBC: 9.8 10*3/uL (ref 4.0–10.5)

## 2017-08-06 NOTE — Progress Notes (Signed)
Patient ID: Caroline [name], female   DOB: Jan 10, 1996, 21 y.o.   MRN: [id number] ACULTY PRACTICE ANTEPARTUM COMPREHENSIVE PROGRESS NOTE  Caroline [name] is a 21 y.o. G2P0010 at 2512w4d  who is admitted for  Vaginal bleeding secondary to post previa  Fetal presentation is cephalic. Length of Stay:  4  Days  Subjective: Pt without complaints today. No bleeding since yesterday. + FM. No ut ctx. Tolerating diet   Vitals:  Blood pressure (!) 97/45, pulse 90, temperature 98.5 F (36.9 C), temperature source Oral, resp. rate 18, height 5\' 2"  (1.575 m), weight 63.9 kg (140 lb 12.8 oz), last menstrual period 12/28/2016, SpO2 98 %.   Physical Examination: Lungs clear Heart RRR Abd soft + BS gravid non tender  Fetal Monitoring:  130-140's, + accels, reactive  Labs:  Results for orders placed or performed during the hospital encounter of 08/02/17 (from the past 24 hour(s))  CBC   Collection Time: 08/06/17  6:29 AM  Result Value Ref Range   WBC 9.8 4.0 - 10.5 K/uL   RBC 3.93 3.87 - 5.11 MIL/uL   Hemoglobin 12.1 12.0 - 15.0 g/dL   HCT 40.934.9 (L) 81.136.0 - 91.446.0 %   MCV 88.8 78.0 - 100.0 fL   MCH 30.8 26.0 - 34.0 pg   MCHC 34.7 30.0 - 36.0 g/dL   RDW 78.213.9 95.611.5 - 21.315.5 %   Platelets 210 150 - 400 K/uL  [facility]   Collection Time: 08/06/17  6:29 AM  Result Value Ref Range   ABO/RH(D) A POS    Antibody Screen NEG    Sample Expiration 08/09/2017     Imaging Studies:    none   Medications:  Scheduled . NIFEdipine  10 mg Oral Q6H  . prenatal vitamin w/FE, FA  1 tablet Oral Q1200   I have reviewed the patient's current medications.  ASSESSMENT: IUP 31 4/7 weeks Post placenta previa Vaginal bleeding.  PLAN: Stable. S/P BMZ x 2. No more bleeding since 08/05/17, observe for 6-7 days. Continue routine antenatal care.   Hermina StaggersMichael L Ervin 08/06/2017,11:11 AM

## 2017-08-07 NOTE — Progress Notes (Signed)
Patient ID: Caroline [name], female   DOB: Oct 22, 1996, 21 y.o.   MRN: [id number] ACULTY PRACTICE ANTEPARTUM COMPREHENSIVE PROGRESS NOTE  Caroline [name] is a 21 y.o. G2P0010 at 2963w5d  who is admitted for vaginal bleeding secondary to post plecenta previa.   Fetal presentation is cephalic. Length of Stay:  5  Days  Subjective: No complaints today. No bleeding. + FM. No ut ctx   Vitals:  Blood pressure (!) 104/54, pulse 90, temperature 98.5 F (36.9 C), temperature source Oral, resp. rate 16, height 5\' 2"  (1.575 m), weight 63.9 kg (140 lb 12.8 oz), last menstrual period 12/28/2016, SpO2 98 %.   Physical Examination: Lungs clear Heart RRR Abd soft + BS gravid non tender Ext non tender  Fetal Monitoring:  130-140's + accels reactive  Labs:  No results found for this or any previous visit (from the past 24 hour(s)).  Imaging Studies:    none   Medications:  Scheduled . NIFEdipine  10 mg Oral Q6H  . prenatal vitamin w/FE, FA  1 tablet Oral Q1200   I have reviewed the patient's current medications.  ASSESSMENT: IUP 32 5/7 Post placenta previa Vaginal bleeding  PLAN: Stable. S/P BMZ. No bleeding Continue routine antenatal care.   Hermina StaggersMichael L Nasean Zapf 08/07/2017,11:47 AM

## 2017-08-08 ENCOUNTER — Encounter (HOSPITAL_COMMUNITY): Payer: Self-pay

## 2017-08-08 DIAGNOSIS — Z3A31 31 weeks gestation of pregnancy: Secondary | ICD-10-CM

## 2017-08-08 NOTE — Progress Notes (Signed)
Patient ID: Caroline [name], female   DOB: [birth year], 21 y.o.   MRN: [id number] ACULTY PRACTICE ANTEPARTUM COMPREHENSIVE PROGRESS NOTE  Caroline [name] is a 21 y.o. G2P0010 at 4658w6d  who is admitted for vaginal bleeding secondary to post. Placenta previa.   Fetal presentation is cephalic. Length of Stay:  6  Days  Subjective: No complaints today. Continues with no bleeding or ut ctx. + FM.   Vitals:  Blood pressure 107/62, pulse 92, temperature 98.2 F (36.8 C), temperature source Oral, resp. rate 16, height 5\' 2"  (1.575 m), weight 63.9 kg (140 lb 12.8 oz), last menstrual period 12/28/2016, SpO2 97 %.   Physical Examination: Lungs clear Heart RRR Abd soft + BS gravid non tender Ext non tender  Fetal Monitoring:  130-150's, + accels, good variability, reactive  Labs:  No results found for this or any previous visit (from the past 24 hour(s)).  Imaging Studies:    none   Medications:  Scheduled . NIFEdipine  10 mg Oral Q6H  . prenatal vitamin w/FE, FA  1 tablet Oral Q1200   I have reviewed the patient's current medications.  ASSESSMENT: IUP 32 6/7 weeks Post Placenta previa Vaginal bleeding d/t above  PLAN: Stable. S/P BMZ. No bleeding since 08/05/17.  Continue routine antenatal care.   Hermina StaggersMichael L Francisca Langenderfer 08/08/2017,8:34 AM

## 2017-08-09 LAB — TYPE AND SCREEN
ABO/RH(D): A POS
ANTIBODY SCREEN: NEGATIVE

## 2017-08-09 NOTE — Progress Notes (Addendum)
Just notified Dr. Alysia PennaErvin about patient who passed a quarter sized blood clot in toilet. Doctor Elroy ChannelIrvin to evaluate. No new orders given. Clot was dark in color, but toilet tissue was bright red.

## 2017-08-09 NOTE — Progress Notes (Addendum)
Patient placed back on EFM. FHR stable at 135 with moderate variability.

## 2017-08-09 NOTE — Plan of Care (Signed)
Problem: Coping: Goal: Level of anxiety will decrease Outcome: Progressing Pt had expected discharge to home today but experienced another episode of vaginal bleeding. Now she will remain hospitalized an additional 5-7 days as a precaution. Pt and husband are in good spirits despite the disappointment.

## 2017-08-09 NOTE — Progress Notes (Signed)
Patient ID: Caroline [name], female   DOB: [birth year], 21 y.o.   MRN: [id number] ACULTY PRACTICE ANTEPARTUM COMPREHENSIVE PROGRESS NOTE  Caroline [name] is a 21 y.o. G2P0010 at 2138w0d  who is admitted for vaginal bleeding secondary to post placenta previa..   Fetal presentation is cephalic. Length of Stay:  7  Days  Subjective: Pt passed a quarter size blood clot today @ 10 while using restroom. Some dark blood afterwards. No further bleeding and no utc cramps pressure or LOF.+ FM.    Vitals:  Blood pressure 113/69, pulse 87, temperature 98.4 F (36.9 C), temperature source Oral, resp. rate 18, height 5\' 2"  (1.575 m), weight 63.9 kg (140 lb 12.8 oz), last menstrual period 12/28/2016, SpO2 98 %.   Physical Examination: Lungs clear Heart RRR Abd soft + BS gravid non tender Ext non tender  Fetal Monitoring:  130's, + accels, good variability  Labs:  Results for orders placed or performed during the hospital encounter of 08/02/17 (from the past 24 hour(s))  Type and screen [facility]   Collection Time: 08/09/17  6:08 AM  Result Value Ref Range   ABO/RH(D) A POS    Antibody Screen NEG    Sample Expiration 08/12/2017     Imaging Studies:    none   Medications:  Scheduled . NIFEdipine  10 mg Oral Q6H  . prenatal vitamin w/FE, FA  1 tablet Oral Q1200   I have reviewed the patient's current medications.  ASSESSMENT: IUP 32 o/7 weeks Post Placenta Previa Vaginal bleeding d/t above  PLAN: With a new episode of bleeding will continue to observe for an additional 5-7 days. Discussed POC with pt and husband. Continue routine antenatal care.   Hermina StaggersMichael L Aby Gessel 08/09/2017,1:26 PM

## 2017-08-10 NOTE — Progress Notes (Signed)
Initial Nutrition Assessment  DOCUMENTATION CODES:   Not applicable  INTERVENTION:  Regular diet May order double protein portions, snacks TID and from retail  NUTRITION DIAGNOSIS:  Increased nutrient needs related to  (pregnancy and fetal growth requirements) as evidenced by  (32 weeks IUP). GOAL:   Patient will meet greater than or equal to 90% of their needs  MONITOR:  Weight trends  REASON FOR ASSESSMENT:  Antenatal    ASSESSMENT:  32 1/7 adm with vag bleeding. pre-preg weight 123 lbs, BMI 22.5. 17 Lb weight gain. Excellent appetite  Diet Order:  Diet regular Room service appropriate? Yes; Fluid consistency: Thin  Skin:  Reviewed, no issues  Height:   Ht Readings from Last 1 Encounters:  08/02/17  (1.575 m)    Weight:   Wt Readings from Last 1 Encounters:  08/02/17 140 lb 12.8 oz (63.9 kg)    Ideal Body Weight:     BMI:  Body mass index is 25.75 kg/m.  Estimated Nutritional Needs:   Kcal:  1700-1900  Protein:  75-85 g  Fluid:  2 L  EDUCATION NEEDS:   No education needs identified at this time  Inez Pilgrim.Odis Luster LDN Neonatal Nutrition Support Specialist/RD III Pager [phone]      Phone [phone]

## 2017-08-10 NOTE — Progress Notes (Signed)
Dr. Vergie Living notified of maternal tachycardia. Pt between 112-114 bpm for past two hours. MD notified pt on procardia and fetal monitoring completed and reactive. Notified pt is asymptomatic. No new orders at this time.

## 2017-08-10 NOTE — Progress Notes (Signed)
Patient ID: Caroline [name], female   DOB: 1996-05-07, 21 y.o.   MRN: [id number] ACULTY PRACTICE ANTEPARTUM COMPREHENSIVE PROGRESS NOTE  Caroline [name] is a 21 y.o. G2P0010 at [redacted]w[redacted]d  who is admitted for vaginal bleeding secondary to post placenta previa.   Fetal presentation is cephalic. Length of Stay:  8  Days  Subjective: Pt without complaints today. No bleeding. + FM. No ctx    Vitals:  Blood pressure 109/75, pulse 84, temperature 98.4 F (36.9 C), temperature source Oral, resp. rate 18, height  (1.575 m), weight 63.9 kg (140 lb 12.8 oz), last menstrual period 12/28/2016, SpO2 99 %. Physical Examination: Lungs clear  Heart RRR Abd soft + BS gravid non tender Ext non tender  Fetal Monitoring:  130's, + accels  Labs:  No results found for this or any previous visit (from the past 24 hour(s)).  Imaging Studies:    none   Medications:  Scheduled . NIFEdipine  10 mg Oral Q6H  . prenatal vitamin w/FE, FA  1 tablet Oral Q1200   I have reviewed the patient's current medications.  ASSESSMENT: IUP 32 1/7 weeks Vaginal bleeding Post placenta previa  PLAN: Stable. No bleeding since yesterday.  Continue routine antenatal care.   Caroline [name] 08/10/2017,8:17 AM

## 2017-08-11 ENCOUNTER — Inpatient Hospital Stay (HOSPITAL_COMMUNITY): Payer: BLUE CROSS/BLUE SHIELD | Admitting: Registered Nurse

## 2017-08-11 ENCOUNTER — Encounter (HOSPITAL_COMMUNITY)
Admission: AD | Disposition: A | Discharge status: Home / Self Care | Payer: Self-pay | Source: Ambulatory Visit | Attending: Obstetrics and Gynecology

## 2017-08-11 ENCOUNTER — Encounter (HOSPITAL_COMMUNITY): Payer: Self-pay | Admitting: Registered Nurse

## 2017-08-11 DIAGNOSIS — Z3A32 32 weeks gestation of pregnancy: Secondary | ICD-10-CM

## 2017-08-11 DIAGNOSIS — O4413 Placenta previa with hemorrhage, third trimester: Secondary | ICD-10-CM

## 2017-08-11 LAB — CBC
HEMATOCRIT: 35.5 % — AB (ref 36.0–46.0)
HEMOGLOBIN: 12.5 g/dL (ref 12.0–15.0)
MCH: 31.1 pg (ref 26.0–34.0)
MCHC: 35.2 g/dL (ref 30.0–36.0)
MCV: 88.3 fL (ref 78.0–100.0)
Platelets: 209 10*3/uL (ref 150–400)
RBC: 4.02 MIL/uL (ref 3.87–5.11)
RDW: 13.6 % (ref 11.5–15.5)
WBC: 9.4 10*3/uL (ref 4.0–10.5)

## 2017-08-11 SURGERY — Surgical Case
Anesthesia: Spinal

## 2017-08-11 MED ORDER — PROMETHAZINE HCL 25 MG/ML IJ SOLN
INTRAMUSCULAR | Status: AC
  Administered 2017-08-11: 6.25 mg via INTRAVENOUS
  Filled 2017-08-11: qty 1

## 2017-08-11 MED ORDER — DIPHENHYDRAMINE HCL 25 MG PO CAPS: 25.0000 mg | ORAL_CAPSULE | Freq: Four times a day (QID) | ORAL | Status: DC | PRN

## 2017-08-11 MED ORDER — OXYTOCIN 10 UNIT/ML IJ SOLN
INTRAVENOUS | Status: DC | PRN
  Administered 2017-08-11: 40 [IU] via INTRAVENOUS

## 2017-08-11 MED ORDER — PRENATAL MULTIVITAMIN CH
1.0000 | ORAL_TABLET | Freq: Every day | ORAL | Status: DC
  Administered 2017-08-12: 1 via ORAL
  Filled 2017-08-11: qty 1

## 2017-08-11 MED ORDER — ONDANSETRON HCL 4 MG/2ML IJ SOLN
INTRAMUSCULAR | Status: DC | PRN
  Administered 2017-08-11: 4 mg via INTRAVENOUS

## 2017-08-11 MED ORDER — SODIUM CHLORIDE 0.9% FLUSH
INTRAVENOUS | Status: AC
  Filled 2017-08-11: qty 12

## 2017-08-11 MED ORDER — SODIUM CHLORIDE 0.9 % IR SOLN
Status: DC | PRN
  Administered 2017-08-11: 1

## 2017-08-11 MED ORDER — DEXTROSE 5 % IV SOLN
1.0000 ug/kg/h | INTRAVENOUS | Status: DC | PRN
  Filled 2017-08-11: qty 2

## 2017-08-11 MED ORDER — MENTHOL 3 MG MT LOZG: 1.0000 | LOZENGE | OROMUCOSAL | Status: DC | PRN

## 2017-08-11 MED ORDER — SODIUM CHLORIDE 0.9% FLUSH: 3.0000 mL | INTRAVENOUS | Status: DC | PRN

## 2017-08-11 MED ORDER — LACTATED RINGERS IV SOLN: INTRAVENOUS | Status: DC

## 2017-08-11 MED ORDER — SOD CITRATE-CITRIC ACID 500-334 MG/5ML PO SOLN: 30.0000 mL | ORAL | Status: DC

## 2017-08-11 MED ORDER — KETOROLAC TROMETHAMINE 30 MG/ML IJ SOLN: 30.0000 mg | Freq: Once | INTRAMUSCULAR | Status: DC | PRN

## 2017-08-11 MED ORDER — POLYETHYLENE GLYCOL 3350 17 G PO PACK
17.0000 g | PACK | Freq: Every day | ORAL | Status: DC
  Administered 2017-08-13: 17 g via ORAL
  Filled 2017-08-11: qty 1

## 2017-08-11 MED ORDER — FENTANYL CITRATE (PF) 100 MCG/2ML IJ SOLN
INTRAMUSCULAR | Status: DC | PRN
  Administered 2017-08-11: 20 ug via INTRATHECAL

## 2017-08-11 MED ORDER — ONDANSETRON HCL 4 MG/2ML IJ SOLN
INTRAMUSCULAR | Status: AC
  Filled 2017-08-11: qty 2

## 2017-08-11 MED ORDER — SCOPOLAMINE 1 MG/3DAYS TD PT72
MEDICATED_PATCH | TRANSDERMAL | Status: AC
  Filled 2017-08-11: qty 1

## 2017-08-11 MED ORDER — PHENYLEPHRINE 8 MG IN D5W 100 ML (0.08MG/ML) PREMIX OPTIME
INJECTION | INTRAVENOUS | Status: DC | PRN
  Administered 2017-08-11: 60 ug/min via INTRAVENOUS

## 2017-08-11 MED ORDER — CEFAZOLIN SODIUM-DEXTROSE 2-4 GM/100ML-% IV SOLN
INTRAVENOUS | Status: AC
  Filled 2017-08-11: qty 100

## 2017-08-11 MED ORDER — HYDROMORPHONE HCL 1 MG/ML IJ SOLN: 0.2500 mg | INTRAMUSCULAR | Status: DC | PRN

## 2017-08-11 MED ORDER — NALBUPHINE HCL 10 MG/ML IJ SOLN: 5.0000 mg | Freq: Once | INTRAMUSCULAR | Status: DC | PRN

## 2017-08-11 MED ORDER — LACTATED RINGERS IV SOLN
INTRAVENOUS | Status: DC | PRN
  Administered 2017-08-11 (×3): via INTRAVENOUS

## 2017-08-11 MED ORDER — OXYTOCIN 40 UNITS IN LACTATED RINGERS INFUSION - SIMPLE MED: 2.5000 [IU]/h | INTRAVENOUS | Status: AC

## 2017-08-11 MED ORDER — ONDANSETRON HCL 4 MG/2ML IJ SOLN: 4.0000 mg | Freq: Three times a day (TID) | INTRAMUSCULAR | Status: DC | PRN

## 2017-08-11 MED ORDER — BUPIVACAINE IN DEXTROSE 0.75-8.25 % IT SOLN
INTRATHECAL | Status: DC | PRN
  Administered 2017-08-11: 9 mg via INTRATHECAL

## 2017-08-11 MED ORDER — SENNOSIDES-DOCUSATE SODIUM 8.6-50 MG PO TABS
2.0000 | ORAL_TABLET | Freq: Every evening | ORAL | Status: DC | PRN
  Administered 2017-08-12: 2 via ORAL
  Filled 2017-08-11: qty 2

## 2017-08-11 MED ORDER — WITCH HAZEL-GLYCERIN EX PADS: 1.0000 "application " | MEDICATED_PAD | CUTANEOUS | Status: DC | PRN

## 2017-08-11 MED ORDER — IBUPROFEN 600 MG PO TABS
600.0000 mg | ORAL_TABLET | Freq: Four times a day (QID) | ORAL | Status: DC
  Administered 2017-08-11 – 2017-08-13 (×6): 600 mg via ORAL
  Filled 2017-08-11 (×7): qty 1

## 2017-08-11 MED ORDER — OXYCODONE HCL 5 MG PO TABS
5.0000 mg | ORAL_TABLET | ORAL | Status: DC | PRN
  Administered 2017-08-11: 5 mg via ORAL
  Filled 2017-08-11: qty 1

## 2017-08-11 MED ORDER — MORPHINE SULFATE (PF) 0.5 MG/ML IJ SOLN
INTRAMUSCULAR | Status: DC | PRN
  Administered 2017-08-11: .2 mg via INTRATHECAL

## 2017-08-11 MED ORDER — OXYCODONE HCL 5 MG PO TABS
10.0000 mg | ORAL_TABLET | ORAL | Status: DC | PRN
  Administered 2017-08-12 (×3): 10 mg via ORAL
  Filled 2017-08-11 (×3): qty 2

## 2017-08-11 MED ORDER — PHENYLEPHRINE 8 MG IN D5W 100 ML (0.08MG/ML) PREMIX OPTIME
INJECTION | INTRAVENOUS | Status: AC
  Filled 2017-08-11: qty 100

## 2017-08-11 MED ORDER — DIPHENHYDRAMINE HCL 50 MG/ML IJ SOLN: 12.5000 mg | INTRAMUSCULAR | Status: DC | PRN

## 2017-08-11 MED ORDER — SCOPOLAMINE 1 MG/3DAYS TD PT72
MEDICATED_PATCH | TRANSDERMAL | Status: DC | PRN
  Administered 2017-08-11: 1 via TRANSDERMAL

## 2017-08-11 MED ORDER — FENTANYL CITRATE (PF) 100 MCG/2ML IJ SOLN
INTRAMUSCULAR | Status: AC
  Filled 2017-08-11: qty 2

## 2017-08-11 MED ORDER — NALOXONE HCL 0.4 MG/ML IJ SOLN: 0.4000 mg | INTRAMUSCULAR | Status: DC | PRN

## 2017-08-11 MED ORDER — MEPERIDINE HCL 25 MG/ML IJ SOLN: 6.2500 mg | INTRAMUSCULAR | Status: DC | PRN

## 2017-08-11 MED ORDER — SIMETHICONE 80 MG PO CHEW
80.0000 mg | CHEWABLE_TABLET | Freq: Three times a day (TID) | ORAL | Status: DC
  Administered 2017-08-11 – 2017-08-13 (×5): 80 mg via ORAL
  Filled 2017-08-11 (×6): qty 1

## 2017-08-11 MED ORDER — CEFAZOLIN SODIUM-DEXTROSE 2-4 GM/100ML-% IV SOLN
2.0000 g | INTRAVENOUS | Status: AC
  Administered 2017-08-11: 2 g via INTRAVENOUS

## 2017-08-11 MED ORDER — NALBUPHINE HCL 10 MG/ML IJ SOLN: 5.0000 mg | INTRAMUSCULAR | Status: DC | PRN

## 2017-08-11 MED ORDER — KETOROLAC TROMETHAMINE 30 MG/ML IJ SOLN: 30.0000 mg | Freq: Four times a day (QID) | INTRAMUSCULAR | Status: AC | PRN

## 2017-08-11 MED ORDER — KETOROLAC TROMETHAMINE 30 MG/ML IJ SOLN
INTRAMUSCULAR | Status: AC
  Administered 2017-08-11: 30 mg via INTRAMUSCULAR
  Filled 2017-08-11: qty 1

## 2017-08-11 MED ORDER — COCONUT OIL OIL
1.0000 "application " | TOPICAL_OIL | Status: DC | PRN
  Administered 2017-08-11: 1 via TOPICAL
  Filled 2017-08-11: qty 120

## 2017-08-11 MED ORDER — MORPHINE SULFATE (PF) 0.5 MG/ML IJ SOLN
INTRAMUSCULAR | Status: AC
  Filled 2017-08-11: qty 10

## 2017-08-11 MED ORDER — KETOROLAC TROMETHAMINE 30 MG/ML IJ SOLN
30.0000 mg | Freq: Four times a day (QID) | INTRAMUSCULAR | Status: AC | PRN
  Administered 2017-08-11: 30 mg via INTRAMUSCULAR

## 2017-08-11 MED ORDER — SCOPOLAMINE 1 MG/3DAYS TD PT72: 1.0000 | MEDICATED_PATCH | Freq: Once | TRANSDERMAL | Status: DC

## 2017-08-11 MED ORDER — TETANUS-DIPHTH-ACELL PERTUSSIS 5-2.5-18.5 LF-MCG/0.5 IM SUSP: 0.5000 mL | Freq: Once | INTRAMUSCULAR | Status: DC

## 2017-08-11 MED ORDER — PROMETHAZINE HCL 25 MG/ML IJ SOLN
6.2500 mg | INTRAMUSCULAR | Status: DC | PRN
  Administered 2017-08-11: 6.25 mg via INTRAVENOUS

## 2017-08-11 MED ORDER — DIBUCAINE 1 % RE OINT: 1.0000 "application " | TOPICAL_OINTMENT | RECTAL | Status: DC | PRN

## 2017-08-11 MED ORDER — DIPHENHYDRAMINE HCL 25 MG PO CAPS
25.0000 mg | ORAL_CAPSULE | ORAL | Status: DC | PRN
  Filled 2017-08-11: qty 1

## 2017-08-11 MED ORDER — ACETAMINOPHEN 325 MG PO TABS: 650.0000 mg | ORAL_TABLET | ORAL | Status: DC | PRN

## 2017-08-11 MED ORDER — OXYTOCIN 10 UNIT/ML IJ SOLN
INTRAMUSCULAR | Status: AC
  Filled 2017-08-11: qty 4

## 2017-08-11 MED ORDER — BUPIVACAINE IN DEXTROSE 0.75-8.25 % IT SOLN
INTRATHECAL | Status: AC
  Filled 2017-08-11: qty 2

## 2017-08-11 MED ORDER — ACETAMINOPHEN 500 MG PO TABS
1000.0000 mg | ORAL_TABLET | Freq: Four times a day (QID) | ORAL | Status: AC
  Administered 2017-08-11 – 2017-08-12 (×2): 1000 mg via ORAL
  Filled 2017-08-11 (×3): qty 2

## 2017-08-11 SURGICAL SUPPLY — 37 items
BENZOIN TINCTURE PRP APPL 2/3 (GAUZE/BANDAGES/DRESSINGS) ×2 IMPLANT
CANISTER SUCT 3000ML PPV (MISCELLANEOUS) ×2 IMPLANT
CHLORAPREP W/TINT 26ML (MISCELLANEOUS) ×2 IMPLANT
CLAMP CORD UMBIL (MISCELLANEOUS) ×2 IMPLANT
CLSR STERI-STRIP ANTIMIC 1/2X4 (GAUZE/BANDAGES/DRESSINGS) ×2 IMPLANT
DERMABOND ADVANCED (GAUZE/BANDAGES/DRESSINGS) ×1
DERMABOND ADVANCED .7 DNX12 (GAUZE/BANDAGES/DRESSINGS) ×1 IMPLANT
DEVICE BLD TRNS LUER ATTCH (MISCELLANEOUS) ×2 IMPLANT
DRSG OPSITE POSTOP 4X10 (GAUZE/BANDAGES/DRESSINGS) ×2 IMPLANT
ELECT REM PT RETURN 9FT ADLT (ELECTROSURGICAL) ×2
ELECTRODE REM PT RTRN 9FT ADLT (ELECTROSURGICAL) ×1 IMPLANT
EXTRACTOR VACUUM KIWI (MISCELLANEOUS) ×2 IMPLANT
GLOVE BIOGEL PI IND STRL 7.0 (GLOVE) ×4 IMPLANT
GLOVE BIOGEL PI IND STRL 7.5 (GLOVE) ×1 IMPLANT
GLOVE BIOGEL PI INDICATOR 7.0 (GLOVE) ×4
GLOVE BIOGEL PI INDICATOR 7.5 (GLOVE) ×1
GLOVE SKINSENSE NS SZ7.0 (GLOVE) ×3
GLOVE SKINSENSE STRL SZ7.0 (GLOVE) ×3 IMPLANT
GOWN STRL REUS W/ TWL LRG LVL3 (GOWN DISPOSABLE) ×2 IMPLANT
GOWN STRL REUS W/ TWL XL LVL3 (GOWN DISPOSABLE) ×1 IMPLANT
GOWN STRL REUS W/TWL LRG LVL3 (GOWN DISPOSABLE) ×2
GOWN STRL REUS W/TWL XL LVL3 (GOWN DISPOSABLE) ×1
KIT ABG SYR 3ML LUER SLIP (SYRINGE) ×2 IMPLANT
NEEDLE HYPO 25X5/8 SAFETYGLIDE (NEEDLE) ×2 IMPLANT
NS IRRIG 1000ML POUR BTL (IV SOLUTION) ×2 IMPLANT
PACK C SECTION WH (CUSTOM PROCEDURE TRAY) ×2 IMPLANT
PAD OB MATERNITY 4.3X12.25 (PERSONAL CARE ITEMS) ×2 IMPLANT
PAD PREP 24X48 CUFFED NSTRL (MISCELLANEOUS) ×2 IMPLANT
PENCIL SMOKE EVAC W/HOLSTER (ELECTROSURGICAL) ×2 IMPLANT
SLEEVE SURGEON STRL (DRAPES) ×2 IMPLANT
SUT MON AB 4-0 PS1 27 (SUTURE) ×4 IMPLANT
SUT MON AB-0 CT1 36 (SUTURE) ×4 IMPLANT
SUT PLAIN 2 0 (SUTURE) ×1
SUT PLAIN ABS 2-0 CT1 27XMFL (SUTURE) ×1 IMPLANT
SUT VIC AB 0 CT1 36 (SUTURE) ×4 IMPLANT
SUT VIC AB 3-0 CT1 27 (SUTURE) ×1
SUT VIC AB 3-0 CT1 TAPERPNT 27 (SUTURE) ×1 IMPLANT

## 2017-08-11 NOTE — Progress Notes (Signed)
Pt got up to use bathroom and passed blood clot in toilet. Unable to tell how large it was due to blood in urine. Pt had bright red blood when wiping. Also stated she had lower abdominal cramps, rating the pain a 5 out of 10. Pt states "it feels like period cramps." Placed pt on monitor. Called Dr. Vergie Living, but was told to call back in a few minutes because he was in a surgery.

## 2017-08-11 NOTE — Op Note (Addendum)
Operative Note   SURGERY DATE: 08/11/2017  PRE-OP DIAGNOSIS:  *Pregnancy at 32/2 *Posterior placenta previa *Vaginal bleeding  POST-OP DIAGNOSIS: Same. Delivered   PROCEDURE: primary low transverse cesarean section via pfannenstiel skin incision with double layer uterine closure  SURGEON: Surgeon(s) and Role:    Aromas Bing, MD - Primary  ASSISTANT: None  ANESTHESIA: spinal  ESTIMATED BLOOD LOSS:  DRAINS: UOP via indwelling foley  TOTAL IV FLUIDS: crystalloid  VTE PROPHYLAXIS: SCDs to bilateral lower extremities  ANTIBIOTICS: Two grams of Cefazolin were given., within 1 hour of skin incision  SPECIMENS: placenta and cord gases  COMPLICATIONS: none  FINDINGS: No intra-abdominal adhesions were noted. Grossly normal uterus, tubes and ovaries. Clear amniotic fluid, cephalic female infant, weight 1845gm, APGARs 8/9, intact placenta.  Results for Caroline, [name] (MRN [id number]) as of 08/13/2017 15:12  Ref. Range 08/11/2017 06:24 08/11/2017 06:25  pH cord blood (arterial) Latest Ref Range: 7.210 - 7.380  7.411 (H) 7.338  pCO2 cord blood (arterial) Latest Ref Range: 42.0 - 56.0 mmHg 36.9 (L) 51.2  Bicarbonate Latest Ref Range: 13.0 - 22.0 mmol/L 23.0 (H) 26.8 (H)    PROCEDURE IN DETAIL: The patient was taken to the operating room where anesthesia was administered and normal fetal heart tones were confirmed. She was then prepped and draped in the normal fashion in the dorsal supine position with a leftward tilt.  After a time out was performed, a pfannensteil  skin incision was made with the scalpel and carried through to the underlying layer of fascia. The fascia was then incised at the midline and this incision was extended laterally with the mayo scissors. Attention was turned to the superior aspect of the fascial incision which was grasped with the kocher clamps x 2, tented up and the rectus muscles were dissected off with the bovie. In a similar  fashion the inferior aspect of the fascial incision was grasped with the kocher clamps, tented up and the rectus muscles dissected off with the mayo scissors. The rectus muscles were then separated in the midline and the peritoneum was entered bluntly. The bladder blade was inserted and the vesicouterine peritoneum was identified, tented up and entered with the metzenbaum scissors. This incision was extended laterally and the bladder flap was created digitally. The bladder blade was reinserted.  A low transverse hysterotomy was made with the scalpel until the endometrial cavity was breached; the placenta was noted to be approx 1.5cm inferior to the hysterotomy. The amniotic sac was ruptured with the Allis clamp, yielding clear amniotic fluid. This incision was extended bluntly and the infant's head, shoulders and body were delivered atraumatically.The cord was clamped x 2 and cut, and the infant was handed to the awaiting pediatricians, after delayed cord clamping was done.  The placenta was then gradually expressed from the uterus and then the uterus was exteriorized and cleared of all clots and debris. The hysterotomy was repaired with a running suture of 1-0 monocyrl. A second imbricating layer of 1-0 monocryl suture was then placed to achieve excellent hemostasis.   The uterus and adnexa were then returned to the abdomen, and the hysterotomy and all operative sites were reinspected and excellent hemostasis was noted after irrigation and suction of the abdomen with warm saline.  The peritoneum was closed with a running stitch of 3-0 Vicryl. The fascia was reapproximated with 0 Vicryl in a simple running fashion bilaterally. The subcutaneous layer was then reapproximated with interrupted sutures of 2-0 plain gut, and  the skin was then closed with 4-0 monocryl, in a subcuticular fashion.  The patient  tolerated the procedure well. Sponge, lap, needle, and instrument counts were correct x 2. The patient was  transferred to the recovery room awake, alert and breathing independently in stable condition.  Cornelia Copa MD Attending Center for [facility])

## 2017-08-11 NOTE — Progress Notes (Signed)
Pt removed from monitor. Lab in the room drawing lab work.

## 2017-08-11 NOTE — Progress Notes (Signed)
Patient ID: Caroline [name], female   DOB: [birth year], 21 y.o.   MRN: [id number]  Called to see pt for onset of vag bleeding/clots at approx 0400. Initially was a small clot and trickle, but then recently she got up to use the bathroom and passed a 500cc clot. Reports onset of cramping simultaneously. FHR 130s, +accels, no decels; toco with occ ctx, some irritability. Abd firm to palpation but not hard. Cervical exam deferred at present, but not current active bldg from vagina seen. Some spotting on pad. IVF bolus going. RN to call Dr Vergie Living in approx 30 mins for update on pt condition and further plan of care.  Cam Hai 08/11/2017

## 2017-08-11 NOTE — Progress Notes (Signed)
Patient c/o of pain 5/10.  Dr. Chaney Malling notified and stated she could begin taking the Oxy. IR.

## 2017-08-11 NOTE — Clinical Social Work Maternal (Signed)
CLINICAL SOCIAL WORK MATERNAL/CHILD NOTE  Patient Details  Name: Chaela R Satterfield MRN: [id number] Date of Birth: 09/28/1996  Date:  08/11/2017  Clinical Social Worker Initiating Note:  Amiri Riechers, MSW, LCSW-A   Date/ Time Initiated:  08/11/17/1221              Child's Name:  Hadlyn Mariangel Granquist   Legal Guardian:  Other (Comment) (Not established by court system; MOB and FOB (Cody Katen) parent collectively )   Need for Interpreter:  None   Date of Referral:   (no referral; new nicu admit )     Reason for Referral:  Other (Comment) (no referral; new nicu admit )   Referral Source:  NICU   Address:  [address] [place], [place] [address]  Phone number:  [phone]   Household Members: Self, Significant Other   Natural Supports (not living in the home): Parent, Friends, Extended Family   Professional Supports:None   Employment:Unemployed   Type of Work: MOB unemployed currently    Education:  High school graduate   Financial Resources:Medicaid   Other Resources: WIC, Food Stamps    Cultural/Religious Considerations Which May Impact Care: Christian   Strengths: Ability to meet basic needs , Pediatrician chosen , Compliance with medical plan , Home prepared for child  ([facility] in [place])   Risk Factors/Current Problems: None   Cognitive State: Able to Concentrate , Alert , Goal Oriented , Insightful    Mood/Affect: Calm , Comfortable , Interested , Happy    CSW Assessment:CSW met with MOB at bedside to offer support and complete assessment for NICU admit. Upon this writer's arrival, MOB was warm and welcoming. CSW explained role and reasoning for visit. MOB verbalized understanding. This writer congratulated MOB on babys arrival. MOB was thankful. CSW inquired about MOB's emotions since c-section. MOB notes she is feeling good so far and is excited to go down to NICU and see baby. This writer praised MOB for  being motivated so quickly after delivery to get up and moving to visit NICU. MOB was thankful. CSW inquired if MOB was aware and understood why baby was admitted to the NICU. MOB noted she did. This writer discussed baby's condition and concern for possible down syndrome. MOB noted the clinical team discussed their was a concern for down syndrome being that baby's nose bridge was flat. This writer explored MOB's feelings and emotions regarding having genetic testing. MOB noted during pregnancy she was opposed to genetic testing but now is open to the idea being that she is here. MOB notes she and FOB still have to discuss but more than likely they will go through with it as they want to be sure baby is healthy. CSW supported she and FOB decision and noted to them that If baby does have down syndrome that does not take away from how healthy she is. MOB verbalized understanding.   CSW assessed for any behavioral health hx. MOB denies. This writer briefly discussed PPD and SIDS. MOB verbalized understanding of both topics noting she has prior knowledge. This writer informed MOB that CSW is available through out baby's entire NICU stay should any needs arise.  MOB was thankful and noted no further needs at this time.   CSW Plan/Description: Information/Referral to Community Resources , Patient/Family Education , Psychosocial Support and Ongoing Assessment of Needs    Necha Harries, MSW, LCSW-A Clinical Social Worker  [facility]  Office: [phone]    CLINICAL SOCIAL WORK MATERNAL/CHILD NOTE  Patient Details  Name: KENNAH HEHR MRN: [id number] Date of Birth: [birth year]  Date:  08/11/2017  Clinical Social Worker Initiating Note:  Ferdinand Lango Kissie Ziolkowski, MSW, LCSW-A  Date/ Time Initiated:  08/11/17/1221     Child's Name:  Ciro Backer Goostree   Legal Guardian:  Other (Comment) (Not established by court system; MOB and FOB (Cody Yeatman) parent collectively )   Need for Interpreter:  None   Date of Referral:   (no referral; new nicu admit )     Reason for Referral:  Other (Comment) (no referral; new nicu admit )   Referral Source:  NICU   Address:  [address] [place], [place] [address]  Phone number:  [phone]   Household Members:  Self, Significant Other   Natural Supports (not living in the home):  Parent, Friends, Extended Family   Professional Supports: None   Employment: Unemployed   Type of Work: MOB unemployed currently    Education:  Database administrator Resources:  [employer]   Other Resources:  [place] Considerations Which May Impact Care:  Christian   Strengths:  Ability to meet basic needs , Pediatrician chosen , Compliance with medical plan , Home prepared for child  [facility] in [place])   Risk Factors/Current Problems:  None   Cognitive State:  Able to Concentrate , Alert , Goal Oriented , Insightful    Mood/Affect:  Calm , Comfortable , Interested , Happy    CSW Assessment: CSW met with MOB at bedside to offer support and complete assessment for NICU admit. Upon this writer's arrival, MOB was warm and welcoming. CSW explained role and reasoning for visit. MOB verbalized understanding. This Probation officer congratulated MOB on babys arrival. MOB was thankful. CSW inquired about MOB's emotions since c-section. MOB notes she is feeling good so far and is excited to go down to NICU and see baby. This Probation officer praised MOB for being motivated so quickly after  delivery to get up and moving to visit NICU. MOB was thankful. CSW inquired if MOB was aware and understood why baby was admitted to the NICU. MOB noted she did. This Probation officer discussed baby's condition and concern for possible down syndrome. MOB noted the clinical team discussed their was a concern for down syndrome being that baby's nose bridge was flat. This Probation officer explored MOB's feelings and emotions regarding having genetic testing. MOB noted during pregnancy she was opposed to genetic testing but now is open to the idea being that she is here. MOB notes she and FOB still have to discuss but more than likely they will go through with it as they want to be sure baby is healthy. CSW supported she and FOB decision and noted to them that If baby does have down syndrome that does not take away from how healthy she is. MOB verbalized understanding.   CSW assessed for any behavioral health hx. MOB denies. This Probation officer briefly discussed PPD and SIDS. MOB verbalized understanding of both topics noting she has prior knowledge. This Probation officer informed MOB that CSW is available through out baby's entire NICU stay should any needs arise.  MOB was thankful and noted no further needs at this time.   CSW Plan/Description:  Information/Referral to [employer] , Engineer, mining , Psychosocial Support and Ongoing Assessment of Needs    Water quality scientist, MSW, [facility]  Office: [phone]

## 2017-08-11 NOTE — Anesthesia Procedure Notes (Signed)
Spinal  Patient location during procedure: OR Start time: 08/11/2017 5:51 AM End time: 08/11/2017 5:54 AM Staffing Anesthesiologist: Leilani Able Performed: anesthesiologist  Preanesthetic Checklist Completed: patient identified, surgical consent, pre-op evaluation, timeout performed, IV checked, risks and benefits discussed and monitors and equipment checked Spinal Block Patient position: sitting Prep: site prepped and draped and DuraPrep Patient monitoring: heart rate, cardiac monitor, continuous pulse ox and blood pressure Approach: midline Location: L3-4 Injection technique: single-shot Needle Needle type: Pencan  Needle gauge: 24 G Needle length: 10 cm Needle insertion depth: 4 cm Assessment Sensory level: T4

## 2017-08-11 NOTE — Progress Notes (Addendum)
Pt passed fist sized clot in urine hat. Pt still trickling bright red blood and still having lower abdominal cramping. One contraction and UI noted on strip. Attempted to call Dr. Vergie Living again, but OR nurse picked up the phone and said he was still in a surgery. Will attempt to call back in a few minutes. Notified the CNM. CNM en route to department.

## 2017-08-11 NOTE — Transfer of Care (Signed)
Immediate Anesthesia Transfer of Care Note  Patient: Caroline [name]  Procedure(s) Performed: Procedure(s): CESAREAN SECTION (N/A)  Patient Location: PACU  Anesthesia Type:Spinal  Level of Consciousness:  sedated, patient cooperative and responds to stimulation  Airway & Oxygen Therapy:Patient Spontanous Breathing and Patient connected to face mask oxgen  Post-op Assessment:  Report given to PACU RN and Post -op Vital signs reviewed and stable  Post vital signs:  Reviewed and stable  Last Vitals:  Vitals:   08/11/17 0450 08/11/17 0709  BP: 122/81   Pulse: 96   Resp:    Temp:  36.6 C  SpO2:      Complications: No apparent anesthesia complications

## 2017-08-11 NOTE — Anesthesia Postprocedure Evaluation (Signed)
Anesthesia Post Note  Patient: Caroline [name]  Procedure(s) Performed: Procedure(s) (LRB): CESAREAN SECTION (N/A)     Patient location during evaluation: [facility] Anesthesia Type: Spinal Level of consciousness: awake and alert Pain management: pain level controlled Vital Signs Assessment: post-procedure vital signs reviewed and stable Respiratory status: spontaneous breathing Cardiovascular status: blood pressure returned to baseline Postop Assessment: no headache, no backache, spinal receding, patient able to bend at knees, no apparent nausea or vomiting and adequate PO intake Anesthetic complications: no    Last Vitals:  Vitals:   08/11/17 1019 08/11/17 1120  BP: (!) 107/57 104/63  Pulse: 86 81  Resp: 18 18  Temp: (!) 36.4 C 36.5 C  SpO2: 99% 99%    Last Pain:  Vitals:   08/11/17 1120  TempSrc: Oral  PainSc:    Pain Goal: Patients Stated Pain Goal: 3 (08/11/17 0500)               Gloyd Happ

## 2017-08-11 NOTE — Progress Notes (Signed)
Patient given instructions and initiated breast pumping.  Patient returned manual demonstration of breast expression.  3-5cc Colostrum obtained.

## 2017-08-11 NOTE — Anesthesia Preprocedure Evaluation (Addendum)
Anesthesia Evaluation  Patient identified by MRN, date of birth, ID band Patient awake    Reviewed: Allergy & Precautions, H&P , NPO status , Patient's Chart, lab work & pertinent test results  Airway Mallampati: II  TM Distance: >3 FB Neck ROM: full    Dental no notable dental hx. (+) Teeth Intact   Pulmonary neg pulmonary ROS,    Pulmonary exam normal breath sounds clear to auscultation       Cardiovascular negative cardio ROS Normal cardiovascular exam Rhythm:regular Rate:Normal     Neuro/Psych negative neurological ROS  negative psych ROS   GI/Hepatic negative GI ROS, Neg liver ROS,   Endo/Other  negative endocrine ROS  Renal/GU negative Renal ROS     Musculoskeletal   Abdominal Normal abdominal exam  (+)   Peds  Hematology negative hematology ROS (+)   Anesthesia Other Findings   Reproductive/Obstetrics (+) Pregnancy Placenta previa                            Anesthesia Physical Anesthesia Plan  ASA: II and emergent  Anesthesia Plan: Spinal   Post-op Pain Management:    Induction:   PONV Risk Score and Plan:   Airway Management Planned:   Additional Equipment:   Intra-op Plan:   Post-operative Plan:   Informed Consent: I have reviewed the patients History and Physical, chart, labs and discussed the procedure including the risks, benefits and alternatives for the proposed anesthesia with the patient or authorized representative who has indicated his/her understanding and acceptance.     Plan Discussed with: CRNA and Surgeon  Anesthesia Plan Comments:         Anesthesia Quick Evaluation

## 2017-08-11 NOTE — Progress Notes (Signed)
Notified Dr. Vergie Living of pt condition. He is en route to the unit to perform a speculum exam.

## 2017-08-11 NOTE — Progress Notes (Addendum)
OB Note Patient with approx of clot in hat with UOP and feeling cramping. EFM category I and toco quiet. Spec exam with approx 50-150mL of clot and blood, unable to see cervix. D/w pt and will proceed with urgent c-section. Last PO at approx 2200 yesterday. bmz on 9/6 and 9/7 efw normal on u/s earlier this month. IVF bolus just started.     Cornelia Copa MD Attending Center for [employer] (Faculty Practice) 08/11/2017 Time: [id number]

## 2017-08-12 ENCOUNTER — Encounter (HOSPITAL_COMMUNITY): Payer: Self-pay | Admitting: Obstetrics and Gynecology

## 2017-08-12 LAB — CBC
HEMATOCRIT: 29.1 % — AB (ref 36.0–46.0)
HEMOGLOBIN: 10.2 g/dL — AB (ref 12.0–15.0)
MCH: 31.5 pg (ref 26.0–34.0)
MCHC: 35.1 g/dL (ref 30.0–36.0)
MCV: 89.8 fL (ref 78.0–100.0)
Platelets: 170 10*3/uL (ref 150–400)
RBC: 3.24 MIL/uL — AB (ref 3.87–5.11)
RDW: 13.8 % (ref 11.5–15.5)
WBC: 10.1 10*3/uL (ref 4.0–10.5)

## 2017-08-12 LAB — RPR: RPR Ser Ql: NONREACTIVE

## 2017-08-12 NOTE — Lactation Note (Signed)
This note was copied from a baby's chart. Lactation Consultation Note  Patient Name: Caroline [name] Date: 08/12/2017 Reason for consult: Initial assessment;NICU baby;Other (Comment);Preterm <34wks  Per mom was set up with the DEBP yesterday and has pumped x 5 in the last 24 hours  With 9-12 ml EBM several times and also feels comfortable with hand expressing.  Per mom the #24 Flange is comfortable when pumping. LC reviewed the pump set up.  Per mom will have a DEBP Medela at home.  LC reviewed the NICU breast feeding booklet, cleaning and storage of breast milk containers.  Mom has labels for breast milk and is familiar with what to do.  Mother informed of post-discharge support and given phone number to the lactation department, including services for phone call assistance; out-patient appointments; and breastfeeding support group. List of other breastfeeding resources in the community given in the handout. Encouraged mother to call for problems or concerns related to breastfeeding. LC praised mom for her efforts pumping and hand expressing. Also mom excited she has been able to do STS in NICU.  LC enc mom to visit baby 1st , STS , then even hand express in NICU in front of baby for baby thoughts and then come back and pump.   Mom mentioned to [facility] and RN that they are testing the baby for "Downs Syndrome" .    Maternal Data Has patient been taught Hand Expression?: Yes (per mom has been shown by the RN to hand express and feels comfortable and gets more ) Does the patient have breastfeeding experience prior to this delivery?: No  Feeding Feeding Type: Donor Breast Milk Length of feed: 30 min  LATCH Score                   Interventions Interventions: Breast feeding basics reviewed  Lactation Tools Discussed/Used Tools: Pump Breast pump type: Double-Electric Breast Pump WIC Program: No Pump Review: Setup, frequency, and cleaning;Milk Storage (RN set up  yesterday / LC reviewed / per mom using a #24 F and comfortable ) Initiated by:: by the RN , reviewed by the [facility]  Date initiated:: 08/12/17   Consult Status Consult Status: Follow-up Date: 08/13/17 Follow-up type: In-patient    Caroline [name] [name] 08/12/2017, 5:17 PM

## 2017-08-12 NOTE — Progress Notes (Signed)
Pt requested to have her foley cath remain intact until this AM. Her reason was " I pee a lot and just don't want to be getting up every minute'. We educated patient about the importance of mobility after C/S, causes of  infection and also the importance of removing F/C asap. Patient verbalized understanding of same. At 0600 hr we removed F/C but patient again refused to get out of bed. Pt stated " when I am ready to pee, I will call you to help me to the bathroom". I agreed to assist patient but also informed her that she must void with the next six hours. Pt stated " I will pee before six hrs.

## 2017-08-12 NOTE — Progress Notes (Signed)
Subjective: Postpartum Day 1: Cesarean Delivery Patient reports feeling well with some mild incisional pain. She denies chest pain, SOB, lightheadedness/dizziness.    Objective: Vital signs in last 24 hours: Temp:  [97.3 F (36.3 C)-98.6 F (37 C)] 98.1 F (36.7 C) (09/16 0550) Pulse Rate:  [61-106] 61 (09/16 0550) Resp:  [14-19] 16 (09/16 0550) BP: (98-114)/(53-76) 102/53 (09/16 0550) SpO2:  [98 %-100 %] 100 % (09/16 0550)  Physical Exam:  General: alert, cooperative and no distress Lochia: appropriate Uterine Fundus: firm Incision: honey comb dressing is clean and dry DVT Evaluation: No evidence of DVT seen on physical exam. Negative Homan's sign.   Recent Labs  08/11/17 0533 08/12/17 0441  HGB 12.5 10.2*  HCT 35.5* 29.1*    Assessment/Plan: Status post Cesarean section. Doing well postoperatively.  Encourage ambulation Continue routine postpartum care.  Caroline [name] 08/12/2017, 7:38 AM

## 2017-08-13 MED ORDER — NORETHINDRONE 0.35 MG PO TABS: 1.0000 | ORAL_TABLET | Freq: Every day | ORAL | 11 refills | Status: DC

## 2017-08-13 MED ORDER — OXYCODONE-ACETAMINOPHEN 5-325 MG PO TABS: 1.0000 | ORAL_TABLET | Freq: Four times a day (QID) | ORAL | 0 refills | Status: DC | PRN

## 2017-08-13 MED ORDER — IBUPROFEN 600 MG PO TABS: 600.0000 mg | ORAL_TABLET | Freq: Four times a day (QID) | ORAL | 0 refills | Status: DC

## 2017-08-13 NOTE — Lactation Note (Signed)
This note was copied from a baby's chart. Lactation Consultation Note  Patient Name: Caroline [name] [id number] Date: 08/13/2017 Reason for consult: Follow-up assessment;Preterm <34wks;NICU baby;Other (Comment) (per mom going home early today ) Per mom since Romeo saw her yesterday has pumped 2-3 times and none on nights. The most EBM yield has been 4 ml when pumping, and when hand expressing more EBM yield. Per mom nipples feel sensitive when pumping. LC reviewed DEBP and setting.  Also to use dab of  Coconut oil on each nipple and areola prior to pumping both breast. Also per mom has been using the #24 Flange . LC mentioned if after using the coconut oil with pumping, and checking the settings if she is comfortable to stay at the #24 F , if not increase to the #27's.  Sore nipple and engorgement prevention and tx reviewed . Per mom will have a DEBP Medela at home. Plan on pumping when visiting baby in NICU and bring pumping kit in basin / with dish soap.  LC reviewed supply and demand and the importance of being consistent with pumping around the clock to establish and protect milk supply.  LC mentioned  The 1st 2 weeks of breast feeding are the most important .  LC discussed the volume to be expected by 7 days.  Mother informed of post-discharge support and given phone number to the lactation department, including services for phone call assistance; out-patient appointments; and breastfeeding support group. List of other breastfeeding resources in the community given in the handout. Encouraged mother to call for problems or concerns related to breastfeeding.  Maternal Data Has patient been taught Hand Expression?: Yes  Feeding Feeding Type: Donor Breast Milk Length of feed: 30 min  LATCH Score                   Interventions Interventions: Breast feeding basics reviewed  Lactation Tools Discussed/Used Tools: Shells;Coconut oil;Comfort gels (see LC note ) Shell Type:  Inverted   Consult Status Consult Status: PRN Follow-up type: Other (comment) (baby in NICU )    Myer Haff 08/13/2017, 9:21 AM

## 2017-08-13 NOTE — Progress Notes (Signed)
Discharge teaching complete. Pt understood all information and did not have any questions. Pt discharged home to family. 

## 2017-08-13 NOTE — Discharge Summary (Signed)
OB Discharge Summary     Patient Name: Caroline [name] DOB: 09-10-1996 MRN: [id number]  Date of admission: 08/02/2017 Delivering MD: [place] Bing   Date of discharge: 08/13/2017  Admitting diagnosis: 31 WEEKS PACENTA PREVIA HEAVY BLEEDING CRAMPING Intrauterine pregnancy: [redacted]w[redacted]d     Secondary diagnosis:  Active Problems:   Placenta previa antepartum in third trimester   Discharge diagnosis: Preterm Pregnancy Delivered                                                                                                Hospital course:  Onset of Labor With Unplanned C/S  21 y.o. yo G2P0110 at [redacted]w[redacted]d was admitted for bleeding with a known placenta previa  on 08/02/2017. Pt started to have increased bleeding and was taken for cesarean section and delivered a Viable infant,08/11/2017  Details of operation can be found in separate operative note. Patient had an uncomplicated postpartum course.  She is ambulating,tolerating a regular diet, passing flatus, and urinating well.  Patient is discharged home in stable condition 08/14/17.  Physical exam  Vitals:   08/11/17 2300 08/12/17 0550 08/12/17 1939 08/13/17 0616  BP:  (!) 102/53 119/66 114/71  Pulse: 79 61 80 86  Resp: Temp: 98.6 F (37 C) 98.1 F (36.7 C) 98.7 F (37.1 C) 98.4 F (36.9 C)  TempSrc: Oral Oral Oral Oral  SpO2: 99% 100% 100% 100%  Weight:      Height:       General: alert, cooperative and no distress Lochia: appropriate Uterine Fundus: firm Incision: Healing well with no significant drainage DVT Evaluation: No evidence of DVT seen on physical exam. Labs: Lab Results  Component Value Date   WBC 10.1 08/12/2017   HGB 10.2 (L) 08/12/2017   HCT 29.1 (L) 08/12/2017   MCV 89.8 08/12/2017   PLT 170 08/12/2017   No flowsheet data found.  Discharge instruction: per After Visit Summary and "Baby and Me Booklet".  After visit meds:  Allergies as of 08/13/2017   No Known Allergies     Medication  List    STOP taking these medications   Breast Pump Misc     TAKE these medications   ferrous sulfate 325 (65 FE) MG tablet TAKE 1 TABLET (325 MG TOTAL) BY MOUTH 2 (TWO) TIMES DAILY.   ibuprofen 600 MG tablet Commonly known as:  ADVIL,MOTRIN Take 1 tablet (600 mg total) by mouth every 6 (six) hours.   norethindrone 0.35 MG tablet Commonly known as:  MICRONOR,CAMILA,ERRIN Take 1 tablet (0.35 mg total) by mouth daily.   oxyCODONE-acetaminophen 5-325 MG tablet Commonly known as:  PERCOCET/ROXICET Take 1-2 tablets by mouth every 6 (six) hours as needed.   prenatal multivitamin Tabs tablet Take 1 tablet by mouth daily at 12 noon.            Discharge Care Instructions        Start     Ordered   08/13/17 0000  ibuprofen (ADVIL,MOTRIN) 600 MG tablet  Every 6 hours     08/13/17 0701   08/13/17 0000  oxyCODONE-acetaminophen (  PERCOCET/ROXICET) 5-325 MG tablet  Every 6 hours PRN     08/13/17 0701   08/13/17 0000  Diet - low sodium heart healthy     08/13/17 1008   08/13/17 0000  Call MD for:  temperature >100.4     08/13/17 1008   08/13/17 0000  Call MD for:  persistant nausea and vomiting     08/13/17 1008   08/13/17 0000  Call MD for:  severe uncontrolled pain     08/13/17 1008   08/13/17 0000  Call MD for:  redness, tenderness, or signs of infection (pain, swelling, redness, odor or green/yellow discharge around incision site)     08/13/17 1008   08/13/17 0000  Call MD for:  difficulty breathing, headache or visual disturbances     08/13/17 1008   08/13/17 0000  Call MD for:  hives     08/13/17 1008   08/13/17 0000  Call MD for:  persistant dizziness or light-headedness     08/13/17 1008   08/13/17 0000  Call MD for:  extreme fatigue     08/13/17 1008   08/13/17 0000  norethindrone (MICRONOR,CAMILA,ERRIN) 0.35 MG tablet  Daily     08/13/17 1008   08/13/17 0000  Discharge wound care:    Comments:  Remove outer dressing in 5 days   08/13/17 1008   08/13/17 0000   Lifting restrictions    Comments:  Weight restriction of 10 lbs.   08/13/17 1008   08/13/17 0000  Driving restriction     Comments:  Avoid driving for at least 2 weeks.   08/13/17 1008   08/13/17 0000  Sexual acrtivity    Comments:  No sexual intercourse for 6 weeks   08/13/17 1008      Diet: routine diet  Activity: Advance as tolerated. Pelvic rest for 6 weeks.   Outpatient follow up:2 weeks Follow up Appt:Future Appointments Date Time Provider Department Center  08/27/2017 2:30 PM [place] Bing, MD [facility]   Follow up Visit:No Follow-up on file.  Postpartum contraception: Progesterone only pills  Newborn Data: Live born female  Birth Weight: 4 lb 1.1 oz (1845 g) APGAR: 8, 9  Baby Feeding: Breast Disposition:NICU   08/13/2017 Willodean Rosenthal, MD

## 2017-08-13 NOTE — Discharge Instructions (Signed)
Postpartum Care After Vaginal Delivery °The period of time right after you deliver your newborn is called the postpartum period. °What kind of medical care will I receive? °· You may continue to receive fluids and medicines through an IV tube inserted into one of your veins. °· If an incision was made near your vagina (episiotomy) or if you had some vaginal tearing during delivery, cold compresses may be placed on your episiotomy or your tear. This helps to reduce pain and swelling. °· You may be given a squirt bottle to use when you go to the bathroom. You may use this until you are comfortable wiping as usual. To use the squirt bottle, follow these steps: °? Before you urinate, fill the squirt bottle with warm water. Do not use hot water. °? After you urinate, while you are sitting on the toilet, use the squirt bottle to rinse the area around your urethra and vaginal opening. This rinses away any urine and blood. °? You may do this instead of wiping. As you start healing, you may use the squirt bottle before wiping yourself. Make sure to wipe gently. °? Fill the squirt bottle with clean water every time you use the bathroom. °· You will be given sanitary pads to wear. °How can I expect to feel? °· You may not feel the need to urinate for several hours after delivery. °· You will have some soreness and pain in your abdomen and vagina. °· If you are breastfeeding, you may have uterine contractions every time you breastfeed for up to several weeks postpartum. Uterine contractions help your uterus return to its normal size. °· It is normal to have vaginal bleeding (lochia) after delivery. The amount and appearance of lochia is often similar to a menstrual period in the first week after delivery. It will gradually decrease over the next few weeks to a dry, yellow-brown discharge. For most women, lochia stops completely by 6-8 weeks after delivery. Vaginal bleeding can vary from woman to woman. °· Within the first few  days after delivery, you may have breast engorgement. This is when your breasts feel heavy, full, and uncomfortable. Your breasts may also throb and feel hard, tightly stretched, warm, and tender. After this occurs, you may have milk leaking from your breasts. Your health care provider can help you relieve discomfort due to breast engorgement. Breast engorgement should go away within a few days. °· You may feel more sad or worried than normal due to hormonal changes after delivery. These feelings should not last more than a few days. If these feelings do not go away after several days, speak with your health care provider. °How should I care for myself? °· Tell your health care provider if you have pain or discomfort. °· Drink enough water to keep your urine clear or pale yellow. °· Wash your hands thoroughly with soap and water for at least 20 seconds after changing your sanitary pads, after using the toilet, and before holding or feeding your baby. °· If you are not breastfeeding, avoid touching your breasts a lot. Doing this can make your breasts produce more milk. °· If you become weak or lightheaded, or you feel like you might faint, ask for help before: °? Getting out of bed. °? Showering. °· Change your sanitary pads frequently. Watch for any changes in your flow, such as a sudden increase in volume, a change in color, the passing of large blood clots. If you pass a blood clot from your vagina, save it   to show to your health care provider. Do not flush blood clots down the toilet without having your health care provider look at them. °· Make sure that all your vaccinations are up to date. This can help protect you and your baby from getting certain diseases. You may need to have immunizations done before you leave the hospital. °· If desired, talk with your health care provider about methods of family planning or birth control (contraception). °How can I start bonding with my baby? °Spending as much time as  possible with your baby is very important. During this time, you and your baby can get to know each other and develop a bond. Having your baby stay with you in your room (rooming in) can give you time to get to know your baby. Rooming in can also help you become comfortable caring for your baby. Breastfeeding can also help you bond with your baby. °How can I plan for returning home with my baby? °· Make sure that you have a car seat installed in your vehicle. °? Your car seat should be checked by a certified car seat installer to make sure that it is installed safely. °? Make sure that your baby fits into the car seat safely. °· Ask your health care provider any questions you have about caring for yourself or your baby. Make sure that you are able to contact your health care provider with any questions after leaving the hospital. °This information is not intended to replace advice given to you by your health care provider. Make sure you discuss any questions you have with your health care provider. °Document Released: 09/10/2007 Document Revised: 04/17/2016 Document Reviewed: 10/18/2015 °Elsevier Interactive Patient Education © 2018 Elsevier Inc. ° °

## 2017-08-14 ENCOUNTER — Ambulatory Visit (HOSPITAL_COMMUNITY): Payer: BLUE CROSS/BLUE SHIELD

## 2017-08-17 ENCOUNTER — Encounter: Payer: BLUE CROSS/BLUE SHIELD | Admitting: Obstetrics & Gynecology

## 2017-08-25 ENCOUNTER — Ambulatory Visit: Payer: Self-pay

## 2017-08-25 NOTE — Lactation Note (Signed)
This note was copied from a baby's chart. Lactation Consultation Note  Patient Name: Caroline [name] [id number] Date: 08/25/2017   Mom visiting baby in [place].  She brought in two 15 ml bottles.  Mom reports getting about 15 to 20 ml with each pumping.  She reports pumping every 3 hours for 15 to 20 minutes.  She just got the Symphony pump through [facility].  Discussed ways to increase milk supply by power pumping, super pumping, warmth, massage, foods to increase supply and galactogogues.  DEBP kit given to keep at bedside to pump while she is visiting with baby. Maternal Data    Feeding Feeding Type: Donor Breast Milk Length of feed: 30 min  LATCH Score                   Interventions    Lactation Tools Discussed/Used Tools: 58F feeding tube / Syringe   Consult Status      Jarold Motto 08/25/2017, 3:01 PM

## 2017-08-27 ENCOUNTER — Ambulatory Visit (INDEPENDENT_AMBULATORY_CARE_PROVIDER_SITE_OTHER): Payer: BLUE CROSS/BLUE SHIELD | Admitting: Obstetrics and Gynecology

## 2017-08-27 ENCOUNTER — Encounter: Payer: Self-pay | Admitting: Obstetrics and Gynecology

## 2017-08-27 VITALS — BP 98/66 | HR 76 | Wt 130.4 lb

## 2017-08-27 DIAGNOSIS — Z09 Encounter for follow-up examination after completed treatment for conditions other than malignant neoplasm: Secondary | ICD-10-CM

## 2017-08-27 NOTE — Progress Notes (Signed)
Incision Check  CC: routine incision check Subjective:    21 y/o G2P0111 s/p urgent 9/15 pLTCS at 32wks for VB and previa while she was being observed while inpatient.  Her and the baby did well and the patient was discharged to home on POD#2.   Patient states she is doing great and feels great, and stopped needing the stronger pain meds about a week after surgery. Newborn is doing well, never needed extra o2 and is now just working on taking from the bottle instead of an FT.    Review of Systems Pertinent items are noted in HPI.   Objective:    BP 98/66   Pulse 76   Wt 130 lb 6.4 oz (59.1 kg)   BMI 23.85 kg/m   NAD Soft, nttp, nd. C/d/i incision and no e/o infection or separation  Assessment:     Normal postop exam   Plan:   Routine care. Routine PP exam nv. BC options d/w pt and likely to do OCPs  [place] Bing, Montez Hageman MD Attending Center for [facility])

## 2017-08-29 ENCOUNTER — Ambulatory Visit: Payer: Self-pay

## 2017-08-29 NOTE — Lactation Note (Signed)
This note was copied from a baby's chart. Lactation Consultation Note  Patient Name: Caroline [name] Today's Date: 08/29/2017  At earlier feedign session, I helped mom with pillow support and showed her how to align baby in football hold for lick and learn, giving her lots of support for a relatively poor toned baby. Mom was able to hand express drops of milk for baby to lick at while receiving tube feeding.    Maternal Data    Feeding    LATCH Score                   Interventions    Lactation Tools Discussed/Used     Consult Status      Caroline [name] 08/29/2017, 4:53 PM

## 2017-09-10 ENCOUNTER — Ambulatory Visit (INDEPENDENT_AMBULATORY_CARE_PROVIDER_SITE_OTHER): Payer: BLUE CROSS/BLUE SHIELD | Admitting: Family Medicine

## 2017-09-10 NOTE — Progress Notes (Signed)
Post Partum Exam  Caroline [name] is a 21 y.o. [phone] female who presents for a postpartum visit. She is six weeks postpartum following a low-transverse c-section delivery I have fully reviewed the prenatal and intrapartum course. The delivery was at 32 gestational weeks.  Anesthesia:Spinal  . Postpartum course has been uncomplicated. Baby's course has been uncomplicated. Baby is feeding by pumping breast milk. Bleeding not at this time. Bowel function is normal. Bladder function is normal. Patient is not  sexually active. Contraception method is progesterone birth control pills. Postpartum depression screening:neg  The following portions of the patient's history were reviewed and updated as appropriate: allergies, current medications, past family history, past medical history, past social history, past surgical history and problem list.  Review of Systems Pertinent items are noted in HPI.    Objective:  unknown if currently breastfeeding.  General:  alert, cooperative and no distress  Lungs: clear to auscultation bilaterally  Heart:  regular rate and rhythm, S1, S2 normal, no murmur, click, rub or gallop  Abdomen: soft, non-tender; bowel sounds normal; no masses,  no organomegaly. Incision clean, dry, intact.        Assessment:    Normal postpartum exam. Pap smear not done at today's visit.   Plan:   1. Contraception: oral progesterone-only contraceptive 2. Follow up in: 1 year or as needed.

## 2017-10-01 ENCOUNTER — Other Ambulatory Visit (HOSPITAL_COMMUNITY)
Admission: RE | Admit: 2017-10-01 | Discharge: 2017-10-01 | Disposition: A | Discharge status: Home / Self Care | Payer: BLUE CROSS/BLUE SHIELD | Source: Ambulatory Visit | Attending: Obstetrics & Gynecology | Admitting: Obstetrics & Gynecology

## 2017-10-01 ENCOUNTER — Other Ambulatory Visit: Payer: BLUE CROSS/BLUE SHIELD

## 2017-10-01 VITALS — BP 121/77 | HR 72

## 2017-10-01 DIAGNOSIS — B373 Candidiasis of vulva and vagina: Secondary | ICD-10-CM | POA: Insufficient documentation

## 2017-10-01 DIAGNOSIS — N898 Other specified noninflammatory disorders of vagina: Secondary | ICD-10-CM | POA: Insufficient documentation

## 2017-10-01 MED ORDER — FLUCONAZOLE 150 MG PO TABS: 150.0000 mg | ORAL_TABLET | Freq: Once | ORAL | 0 refills | Status: AC

## 2017-10-01 NOTE — Progress Notes (Signed)
Patient presented to the office today for a self-swab. Patient thinks she has a yeast infection and would like a diflucan called into her pharmacy of choice. Patient was instructed on how to self swab. Swab was collected, labeled and taken to the lab. Patient was advised we will call her once results come back. Request a diflucan be called in.

## 2017-10-02 LAB — CERVICOVAGINAL ANCILLARY ONLY
Bacterial vaginitis: NEGATIVE
CHLAMYDIA, DNA PROBE: NEGATIVE
Candida vaginitis: POSITIVE — AB
Neisseria Gonorrhea: NEGATIVE
TRICH (WINDOWPATH): NEGATIVE

## 2017-10-03 ENCOUNTER — Telehealth: Payer: Self-pay

## 2017-10-03 NOTE — Telephone Encounter (Signed)
Call patient inform her of test result.Diflucin was sent in for patient.

## 2017-10-03 NOTE — Telephone Encounter (Signed)
-----   Message from Allie BossierMyra C Dove, MD sent at 10/03/2017  3:38 PM EST ----- Can you please give her some diflucan. Thanks

## 2018-01-18 ENCOUNTER — Ambulatory Visit: Payer: BLUE CROSS/BLUE SHIELD | Admitting: Obstetrics and Gynecology

## 2018-01-18 VITALS — BP 107/69 | HR 108 | Ht 62.0 in | Wt 135.0 lb

## 2018-01-18 DIAGNOSIS — Z3041 Encounter for surveillance of contraceptive pills: Secondary | ICD-10-CM

## 2018-01-18 DIAGNOSIS — R1031 Right lower quadrant pain: Secondary | ICD-10-CM

## 2018-01-18 DIAGNOSIS — Z3202 Encounter for pregnancy test, result negative: Secondary | ICD-10-CM

## 2018-01-18 DIAGNOSIS — N939 Abnormal uterine and vaginal bleeding, unspecified: Secondary | ICD-10-CM

## 2018-01-18 LAB — POCT URINALYSIS DIPSTICK
APPEARANCE: NORMAL
Bilirubin, UA: NEGATIVE
Glucose, UA: NEGATIVE
Ketones, UA: NEGATIVE
NITRITE UA: NEGATIVE
Odor: NORMAL
PH UA: 5 (ref 5.0–8.0)
PROTEIN UA: NEGATIVE
RBC UA: NEGATIVE
SPEC GRAV UA: 1.01 (ref 1.010–1.025)
UROBILINOGEN UA: NEGATIVE U/dL — AB

## 2018-01-18 LAB — POCT URINE PREGNANCY: PREG TEST UR: NEGATIVE

## 2018-01-18 MED ORDER — FLUCONAZOLE 150 MG PO TABS: 150.0000 mg | ORAL_TABLET | Freq: Once | ORAL | 1 refills | Status: AC

## 2018-01-18 NOTE — Progress Notes (Signed)
Obstetrics and Gynecology Established Patient Evaluation  Appointment Date: 01/19/2018  OBGYN Clinic: Center for [facility]  Primary Care Provider: Tamsen Roers  Referring Provider: Tamsen Roers, PA  Chief Complaint: AUB, abdominal discomfort  History of Present Illness: Caroline [name] is a 22 y.o. Caucasian G2P0111 (Patient's last menstrual period was 12/21/2017 (exact date).), seen for the above chief complaint.   S/p 9/15 c-section via pfannenstiel skin incision for bleeding and previa.    AUB: pt is still breastfeeding and using micronor for [facility]. She states she sometimes she will get a period and sometimes not. She hasn't had one this month and went two months before having one last month.   Abdominal discomfort: occasionally has b/l sharp pains on the lateral edges of her incision. No aggravating or alleviating s/s. Pains aren't persistent and don't last long.   No discharge, itching or pain with intercourse.  Review of Systems:  as noted in the History of Present Illness.   Past Medical History:  Past Medical History:  Diagnosis Date  . Medical history non-contributory     Past Surgical History:  Past Surgical History:  Procedure Laterality Date  . CESAREAN SECTION N/A 08/11/2017   Procedure: CESAREAN SECTION;  Surgeon: [place] Bing, MD;  Location: [facility] BIRTHING SUITES;  Service: Obstetrics;  Laterality: N/A;    Past Obstetrical History:  OB History  Gravida Para Term Preterm AB Living  2 1 0 1 1 1   SAB TAB Ectopic Multiple Live Births  1 0 0 0 1    # Outcome Date GA Lbr Len/2nd Weight Sex Delivery Anes PTL Lv  2 Preterm 08/11/17 [redacted]w[redacted]d  4 lb 1.1 oz (1.845 kg) F CS-LTranv Spinal  LIV  1 SAB 10/2016             Past Gynecological History: As per HPI. History of Pap Smear(s): Yes.   Last pap 2018, which was NILM  Social History:  Social History   Socioeconomic History  . Marital status: Single    Spouse name: Not on  file  . Number of children: Not on file  . Years of education: Not on file  . Highest education level: Not on file  Social Needs  . Financial resource strain: Not on file  . Food insecurity - worry: Not on file  . Food insecurity - inability: Not on file  . Transportation needs - medical: Not on file  . Transportation needs - non-medical: Not on file  Occupational History  . Not on file  Tobacco Use  . Smoking status: Never Smoker  . Smokeless tobacco: Never Used  Substance and Sexual Activity  . Alcohol use: No    Comment: social  . Drug use: No  . Sexual activity: Yes    Partners: Male    Birth control/protection: None  Other Topics Concern  . Not on file  Social History Narrative  . Not on file    Family History:  Family History  Problem Relation Age of Onset  . Kidney failure Mother   . Drug abuse Mother   . Drug abuse Maternal Grandmother   . Hypertension Paternal Grandfather   . Lung cancer Paternal Grandfather   . Diabetes Paternal Grandfather     Medications Caroline [name] had no medications administered during this visit. Current Outpatient Medications  Medication Sig Dispense Refill  . norethindrone (MICRONOR,CAMILA,ERRIN) 0.35 MG tablet Take 1 tablet (0.35 mg total) by mouth daily. 1 Package 11   No current  facility-administered medications for this visit.     Allergies Patient has no known allergies.   Physical Exam:  BP 107/69   Pulse (!) 108   Ht 5\' 2"  (1.575 m)   Wt 135 lb (61.2 kg)   LMP 12/21/2017 (Exact Date)   BMI 24.69 kg/m  Body mass index is 24.69 kg/m. General appearance: Well nourished, well developed female in no acute distress.   Cardiovascular: normal s1 and s2.  No murmurs, rubs or gallops. Respiratory:  Clear to auscultation bilateral. Normal respiratory effort Abdomen: positive bowel sounds and no masses, hernias; diffusely non tender to palpation, non distended. Well healed low transverse skin incision. She does  note some numbness at the lateral edges of the incision Neuro/Psych:  Normal mood and affect.  Skin:  Warm and dry.  Lymphatic:  No inguinal lymphadenopathy.   Pelvic exam: is not limited by body habitus EGBUS: within normal limits, Vagina: within normal limits and with no blood or discharge in the vault, Cervix: normal appearing cervix without tenderness, discharge or lesions. Uterus:  nonenlarged and non tender and Adnexa:  normal adnexa and no mass, fullness, tenderness Rectovaginal: deferred   Laboratory: UPT and u/a neg  Radiology: none  Assessment: pt stable  Plan:  1. lower abdominal pain Sounds neuropathic and c/w where she points. D/w her likely cutaneous branches and should resolve by about 9869m post op and as long as not painful or bothersome, NTD.  - POCT urine pregnancy - POCT Urinalysis Dipstick  2. AUB Likely due to OCP. Pt using it correctly. She desires to stay on it. Other options d/w her and told her that if she would like to do a regular OCP or another BC method to let us know.   Orders Placed This Encounter  Procedures  . POCT urine pregnancy  . POCT Urinalysis Dipstick    RTC 7259m for annual  Cornelia Copaharlie Anissia Wessells, Jr MD Attending Center [facility] Aub: two periods last month, no period  VVC x 1 wk. No otcs  Sharp pains x 1wks

## 2018-01-18 NOTE — Progress Notes (Signed)
Having abdominal pain, mainly right lower quadrant. Has some vaginal discharge.

## 2018-01-19 ENCOUNTER — Encounter: Payer: Self-pay | Admitting: Obstetrics and Gynecology

## 2018-01-27 ENCOUNTER — Encounter (INDEPENDENT_AMBULATORY_CARE_PROVIDER_SITE_OTHER): Payer: Self-pay

## 2018-01-29 ENCOUNTER — Other Ambulatory Visit: Payer: Self-pay | Admitting: *Deleted

## 2018-01-29 MED ORDER — FLUCONAZOLE 150 MG PO TABS: 150.0000 mg | ORAL_TABLET | ORAL | 3 refills | Status: DC

## 2018-02-21 ENCOUNTER — Encounter: Payer: Self-pay | Admitting: Obstetrics and Gynecology

## 2018-02-21 ENCOUNTER — Other Ambulatory Visit: Payer: Self-pay

## 2018-02-21 MED ORDER — FLUCONAZOLE 150 MG PO TABS: 150.0000 mg | ORAL_TABLET | ORAL | 3 refills | Status: DC

## 2018-02-21 NOTE — Telephone Encounter (Signed)
Received message from patient requesting another be called in. Diflucan has been called into the [facility] pharmacy.

## 2018-02-22 ENCOUNTER — Ambulatory Visit: Payer: BLUE CROSS/BLUE SHIELD | Admitting: Podiatry

## 2018-03-08 ENCOUNTER — Ambulatory Visit: Payer: BLUE CROSS/BLUE SHIELD | Admitting: Podiatry

## 2018-04-29 ENCOUNTER — Encounter: Payer: Self-pay | Admitting: Obstetrics and Gynecology

## 2018-04-29 ENCOUNTER — Other Ambulatory Visit: Payer: Self-pay

## 2018-04-29 MED ORDER — FLUCONAZOLE 150 MG PO TABS: 150.0000 mg | ORAL_TABLET | ORAL | 3 refills | Status: DC

## 2018-04-29 NOTE — Telephone Encounter (Signed)
Patient is requesting a refill on diflucan to help with yeast.

## 2018-06-11 ENCOUNTER — Encounter: Payer: Self-pay | Admitting: Obstetrics and Gynecology

## 2018-06-11 ENCOUNTER — Other Ambulatory Visit: Payer: Self-pay

## 2018-06-11 MED ORDER — FLUCONAZOLE 150 MG PO TABS: 150.0000 mg | ORAL_TABLET | ORAL | 3 refills | Status: DC

## 2018-07-10 ENCOUNTER — Other Ambulatory Visit: Payer: Self-pay

## 2018-07-10 MED ORDER — NORETHINDRONE 0.35 MG PO TABS: 1.0000 | ORAL_TABLET | Freq: Every day | ORAL | 11 refills | Status: DC

## 2018-07-10 NOTE — Telephone Encounter (Signed)
Patient will like to a refill on birth control pills.

## 2018-09-04 DIAGNOSIS — R519 Headache, unspecified: Secondary | ICD-10-CM

## 2018-09-04 HISTORY — DX: Headache, unspecified: R51.9

## 2018-11-11 ENCOUNTER — Encounter: Payer: Self-pay | Admitting: Radiology

## 2019-01-09 ENCOUNTER — Ambulatory Visit: Payer: BLUE CROSS/BLUE SHIELD | Admitting: Obstetrics and Gynecology

## 2019-01-16 ENCOUNTER — Ambulatory Visit (INDEPENDENT_AMBULATORY_CARE_PROVIDER_SITE_OTHER): Payer: BLUE CROSS/BLUE SHIELD | Admitting: Obstetrics and Gynecology

## 2019-01-16 ENCOUNTER — Encounter: Payer: Self-pay | Admitting: Obstetrics and Gynecology

## 2019-01-16 VITALS — BP 113/77 | HR 84 | Ht 62.0 in | Wt 138.0 lb

## 2019-01-16 DIAGNOSIS — B373 Candidiasis of vulva and vagina: Secondary | ICD-10-CM

## 2019-01-16 DIAGNOSIS — B3731 Acute candidiasis of vulva and vagina: Secondary | ICD-10-CM

## 2019-01-16 DIAGNOSIS — Z01419 Encounter for gynecological examination (general) (routine) without abnormal findings: Secondary | ICD-10-CM

## 2019-01-16 DIAGNOSIS — Q909 Down syndrome, unspecified: Secondary | ICD-10-CM | POA: Insufficient documentation

## 2019-01-16 MED ORDER — FLUCONAZOLE 150 MG PO TABS: ORAL_TABLET | ORAL | 1 refills | Status: DC

## 2019-01-16 NOTE — Progress Notes (Signed)
Obstetrics and Gynecology Annual Patient Evaluation  Appointment Date: 01/16/2019  OBGYN Clinic: Center for [facility]  Primary Care Provider: Tamsen Roers  Referring Provider: Tamsen Roers, PA  Chief Complaint:  Chief Complaint  Patient presents with  . Gynecologic Exam    History of Present Illness: Caroline [name] is a 23 y.o. Caucasian G2P0111 (Patient's last menstrual period was 01/04/2019 (exact date).), seen for the above chief complaint. Her past medical history is significant for migraines, h/o child affected with Down Syndrome, h/o c-section.    Vaginal irritation s/s that feels like a yeast infection.   No breast s/s,nausea, vomiting, abdominal pain, dysuria, vaginal itching, dyspareunia, diarrhea, constipation  Review of Systems: as noted in the History of Present Illness.   Past Medical History:  Past Medical History:  Diagnosis Date  . History of child affected by Down syndrome   . Parvovirus exposure 03/26/2017   Dog sick with Parvovirus around [redacted] weeks GA Negative initial and f/u testing.   . Placenta previa affecting delivery    c-section    Past Surgical History:  Past Surgical History:  Procedure Laterality Date  . CESAREAN SECTION N/A 08/11/2017   Procedure: CESAREAN SECTION;  Surgeon: [place] Bing, MD;  Location: [facility] BIRTHING SUITES;  Service: Obstetrics;  Laterality: N/A;    Past Obstetrical History:  OB History  Gravida Para Term Preterm AB Living  2 1 0 1 1 1   SAB TAB Ectopic Multiple Live Births  1 0 0 0 1    # Outcome Date GA Lbr Len/2nd Weight Sex Delivery Anes PTL Lv  2 Preterm 08/11/17 [redacted]w[redacted]d  4 lb 1.1 oz (1.845 kg) F CS-LTranv Spinal  LIV  1 SAB 10/2016            Past Gynecological History: As per HPI. Periods: qmonth, regular, 5-6d, not particularly heavy or painful History of Pap Smear(s): Yes.   Last pap 2018 which was negative She is currently using condoms for contraception.   Social  History:  Social History   Socioeconomic History  . Marital status: Married    Spouse name: Not on file  . Number of children: Not on file  . Years of education: Not on file  . Highest education level: Not on file  Occupational History  . Not on file  Social Needs  . Financial resource strain: Not on file  . Food insecurity:    Worry: Not on file    Inability: Not on file  . Transportation needs:    Medical: Not on file    Non-medical: Not on file  Tobacco Use  . Smoking status: Never Smoker  . Smokeless tobacco: Never Used  Substance and Sexual Activity  . Alcohol use: No    Comment: social  . Drug use: No  . Sexual activity: Yes    Partners: Male    Birth control/protection: Condom  Lifestyle  . Physical activity:    Days per week: Not on file    Minutes per session: Not on file  . Stress: Not on file  Relationships  . Social connections:    Talks on phone: Not on file    Gets together: Not on file    Attends religious service: Not on file    Active member of club or organization: Not on file    Attends meetings of clubs or organizations: Not on file    Relationship status: Not on file  . Intimate partner violence:    Fear  of current or ex partner: Not on file    Emotionally abused: Not on file    Physically abused: Not on file    Forced sexual activity: Not on file  Other Topics Concern  . Not on file  Social History Narrative  . Not on file    Family History:  Family History  Problem Relation Age of Onset  . Kidney failure Mother   . Drug abuse Mother   . Drug abuse Maternal Grandmother   . Hypertension Paternal Grandfather   . Lung cancer Paternal Grandfather   . Diabetes Paternal Grandfather     Medications [place] R. [place] had no medications administered during this visit. Current Outpatient Medications  Medication Sig Dispense Refill  . Riboflavin (VITAMIN B-2 PO) Take 400 mg by mouth.     No current facility-administered  medications for this visit.     Allergies Patient has no known allergies.   Physical Exam:  BP 113/77   Pulse 84   Ht 5\' 2"  (1.575 m)   Wt 138 lb (62.6 kg)   LMP 01/04/2019 (Exact Date)   Breastfeeding Unknown   BMI 25.24 kg/m  Body mass index is 25.24 kg/m. General appearance: Well nourished, well developed female in no acute distress.  Neck:  Supple, normal appearance, and no thyromegaly  Cardiovascular: normal s1 and s2.  No murmurs, rubs or gallops. Respiratory:  Clear to auscultation bilateral. Normal respiratory effort Abdomen: positive bowel sounds and no masses, hernias; diffusely non tender to palpation, non distended Breasts: breasts appear normal, no suspicious masses, no skin or nipple changes or axillary nodes, and normal palpation. Neuro/Psych:  Normal mood and affect.  Skin:  Warm and dry.  Lymphatic:  No inguinal lymphadenopathy.   Pelvic exam: is not limited by body habitus EGBUS: within normal limits, +mild irritation/erythema. Vagina: within normal limits and with no blood in the vault, +white cottage cheese like d/c, Cervix: normal appearing cervix without tenderness, discharge or lesions. Uterus:  nonenlarged and non tender and Adnexa:  normal adnexa and no mass, fullness, tenderness Rectovaginal: deferred  Laboratory: none  Radiology: none  Assessment: pt stable  Plan:  1. Well woman exam Rpt pap next year. Pt on condoms now as undergoing migraine evaluation so not wanting to take hormones and condoms working well.   Patient wondering about future pregnancy issues. I told her that she does have a higher chance of having another previa in another pregnancy due to the fact that she has had a previa in the past. I did tell her that her child having down syndrome likely contributed to the abnormal placentation, to some degree.  I also told her that I'd recommend that her and her husband see genetics to talk about future risk of having another child affected  by down syndrome given that they are so young (24 and 9) and one of them could have affected DNA that places them at higher risk for DS in future pregnancies and to see about any dna testing for either of them. Pt to talk with husband and let us know.   2. Vulvovaginal candidiasis Diflucan    RTC PRN  Cornelia Copa MD Attending Center for [facility])

## 2019-01-16 NOTE — Patient Instructions (Signed)
Let us know if you'd like to talk to a genetic counselor about your risk of having another child affected with Down Syndrome, specifically to talk about you and your husband's risk of [place] Translocation that may increase a risk for Down Syndrome.

## 2019-01-16 NOTE — Progress Notes (Signed)
Would like to discuss future pregancies

## 2019-01-31 ENCOUNTER — Other Ambulatory Visit: Payer: Self-pay | Admitting: *Deleted

## 2019-04-01 ENCOUNTER — Other Ambulatory Visit: Payer: Self-pay | Admitting: *Deleted

## 2019-04-01 MED ORDER — FLUCONAZOLE 150 MG PO TABS: ORAL_TABLET | ORAL | 1 refills | Status: DC

## 2019-09-11 ENCOUNTER — Ambulatory Visit (INDEPENDENT_AMBULATORY_CARE_PROVIDER_SITE_OTHER): Payer: BC Managed Care – PPO | Admitting: *Deleted

## 2019-09-11 ENCOUNTER — Other Ambulatory Visit: Payer: Self-pay

## 2019-09-11 VITALS — BP 114/68 | HR 78

## 2019-09-11 DIAGNOSIS — N898 Other specified noninflammatory disorders of vagina: Secondary | ICD-10-CM

## 2019-09-11 MED ORDER — FLUCONAZOLE 150 MG PO TABS: 150.0000 mg | ORAL_TABLET | Freq: Once | ORAL | 1 refills | Status: AC

## 2019-09-11 NOTE — Progress Notes (Signed)
SUBJECTIVE:  23 y.o. female complains of yeast infection and has used monistat OTC x 2 and it has not helped. Denies abnormal vaginal bleeding or significant pelvic pain or fever. No UTI symptoms. Denies history of known exposure to STD.  No LMP recorded.  OBJECTIVE:  She appears well, afebrile. Urine dipstick: not done.  ASSESSMENT:  Vaginal Discharge     PLAN:   BVAG, CVAG probe sent to lab. Treatment: To be determined once lab results are received.  ROV prn if symptoms persist or worsen.

## 2019-09-11 NOTE — Progress Notes (Signed)
Patient seen and assessed by nursing staff during this encounter. I have reviewed the chart and agree with the documentation and plan.  Verita Schneiders, MD 09/11/2019 4:17 PM

## 2019-09-15 LAB — CERVICOVAGINAL ANCILLARY ONLY
Bacterial Vaginitis (gardnerella): NEGATIVE
Candida Glabrata: NEGATIVE
Candida Vaginitis: NEGATIVE
Comment: NEGATIVE
Comment: NEGATIVE
Comment: NEGATIVE

## 2019-11-05 ENCOUNTER — Encounter: Payer: Self-pay | Admitting: Radiology

## 2020-03-26 ENCOUNTER — Other Ambulatory Visit: Payer: Self-pay | Admitting: Registered Nurse

## 2020-03-26 DIAGNOSIS — G43719 Chronic migraine without aura, intractable, without status migrainosus: Secondary | ICD-10-CM

## 2020-03-30 ENCOUNTER — Other Ambulatory Visit: Payer: Self-pay | Admitting: Family Medicine

## 2020-03-30 DIAGNOSIS — M25561 Pain in right knee: Secondary | ICD-10-CM

## 2020-04-09 ENCOUNTER — Ambulatory Visit: Payer: Medicaid Other

## 2020-04-23 ENCOUNTER — Ambulatory Visit
Admission: RE | Admit: 2020-04-23 | Discharge: 2020-04-23 | Disposition: A | Discharge status: Home / Self Care | Payer: Medicaid Other | Source: Ambulatory Visit | Attending: Registered Nurse | Admitting: Registered Nurse

## 2020-04-23 ENCOUNTER — Other Ambulatory Visit: Payer: Self-pay

## 2020-04-23 DIAGNOSIS — G43719 Chronic migraine without aura, intractable, without status migrainosus: Secondary | ICD-10-CM | POA: Diagnosis not present

## 2020-04-28 DIAGNOSIS — R52 Pain, unspecified: Secondary | ICD-10-CM | POA: Diagnosis not present

## 2020-04-28 DIAGNOSIS — E559 Vitamin D deficiency, unspecified: Secondary | ICD-10-CM | POA: Diagnosis not present

## 2020-04-28 DIAGNOSIS — G43719 Chronic migraine without aura, intractable, without status migrainosus: Secondary | ICD-10-CM | POA: Diagnosis not present

## 2020-04-28 DIAGNOSIS — G43829 Menstrual migraine, not intractable, without status migrainosus: Secondary | ICD-10-CM | POA: Diagnosis not present

## 2020-05-20 ENCOUNTER — Ambulatory Visit (INDEPENDENT_AMBULATORY_CARE_PROVIDER_SITE_OTHER): Payer: 59 | Admitting: Obstetrics and Gynecology

## 2020-05-20 ENCOUNTER — Other Ambulatory Visit: Payer: Self-pay

## 2020-05-20 ENCOUNTER — Encounter: Payer: Self-pay | Admitting: Obstetrics and Gynecology

## 2020-05-20 ENCOUNTER — Other Ambulatory Visit (HOSPITAL_COMMUNITY)
Admission: RE | Admit: 2020-05-20 | Discharge: 2020-05-20 | Disposition: A | Discharge status: Home / Self Care | Payer: 59 | Source: Ambulatory Visit | Attending: Obstetrics and Gynecology | Admitting: Obstetrics and Gynecology

## 2020-05-20 VITALS — BP 119/77 | HR 68 | Wt 131.4 lb

## 2020-05-20 DIAGNOSIS — Z01419 Encounter for gynecological examination (general) (routine) without abnormal findings: Secondary | ICD-10-CM

## 2020-05-20 DIAGNOSIS — Z3009 Encounter for other general counseling and advice on contraception: Secondary | ICD-10-CM

## 2020-05-20 NOTE — Progress Notes (Signed)
Pap today No sti testing No birth control

## 2020-05-20 NOTE — Patient Instructions (Signed)
Cycle day one is the first day of regular flow of your period Count to cycle day 10 and have sex on that day and then every other day for a week If you haven't gotten pregnant after six months, call us.

## 2020-05-20 NOTE — Progress Notes (Signed)
Last pap 03/26/2017- normal

## 2020-05-21 ENCOUNTER — Encounter: Payer: Self-pay | Admitting: Obstetrics and Gynecology

## 2020-05-21 DIAGNOSIS — M7121 Synovial cyst of popliteal space [Baker], right knee: Secondary | ICD-10-CM | POA: Diagnosis not present

## 2020-05-21 NOTE — Progress Notes (Signed)
Obstetrics and Gynecology Annual Patient Evaluation  Appointment Date: 05/20/2020  OBGYN Clinic: Center for [facility]  Primary Care Provider: Center, [facility]  Chief Complaint:  Chief Complaint  Patient presents with  . Gynecologic Exam  Future pregnancy questions  History of Present Illness: Caroline [name] is a 24 y.o. Caucasian G2P0111 (Patient's last menstrual period was 05/18/2020.), seen for the above chief complaint. Her past medical history is significant for h/o c-section.    Patient denies any GYN problems or issues  Patient and husband would like to try again to conceive and has questions about this re: risk of future pregnancy complications.   Review of Systems: Pertinent items noted in HPI and remainder of comprehensive ROS otherwise negative.    Past Medical History:  Past Medical History:  Diagnosis Date  . History of child affected by Down syndrome   . Parvovirus exposure 03/26/2017   Dog sick with Parvovirus around [redacted] weeks GA Negative initial and f/u testing.   . Placenta previa affecting delivery    c-section    Past Surgical History:  Past Surgical History:  Procedure Laterality Date  . CESAREAN SECTION N/A 08/11/2017   Procedure: CESAREAN SECTION;  Surgeon: Han[place] Bing, MD;  Location: [facility] BIRTHING SUITES;  Service: Obstetrics;  Laterality: N/A;    Past Obstetrical History:  OB History  Gravida Para Term Preterm AB Living  2 1 0 1 1 1   SAB TAB Ectopic Multiple Live Births  1 0 0 0 1    # Outcome Date GA Lbr Len/2nd Weight Sex Delivery Anes PTL Lv  2 Preterm 08/11/17 [redacted]w[redacted]d  4 lb 1.1 oz (1.845 kg) F CS-LTranv Spinal  LIV  1 SAB 10/2016            Past Gynecological History: As per HPI. Periods: qmonth; regular; not heavy or prolonged History of Pap Smear(s): Yes.   Last pap 2018, which was negative She is currently using no method for contraception.   Social History:  Social History   Socioeconomic  History  . Marital status: Married    Spouse name: Not on file  . Number of children: Not on file  . Years of education: Not on file  . Highest education level: Not on file  Occupational History  . Not on file  Tobacco Use  . Smoking status: Never Smoker  . Smokeless tobacco: Never Used  Vaping Use  . Vaping Use: Never used  Substance and Sexual Activity  . Alcohol use: No    Comment: social  . Drug use: No  . Sexual activity: Yes    Partners: Male    Birth control/protection: None  Other Topics Concern  . Not on file  Social History Narrative  . Not on file   Social Determinants of Health   Financial Resource Strain:   . Difficulty of Paying Living Expenses:   Food Insecurity:   . Worried About 2019 in the Last Year:   . Programme researcher, broadcasting/film/video in the Last Year:   Transportation Needs:   . Barista (Medical):   Freight forwarder Lack of Transportation (Non-Medical):   Physical Activity:   . Days of Exercise per Week:   . Minutes of Exercise per Session:   Stress:   . Feeling of Stress :   Social Connections:   . Frequency of Communication with Friends and Family:   . Frequency of Social Gatherings with Friends and Family:   . Attends Religious  Services:   . Active Member of Clubs or Organizations:   . Attends Archivist Meetings:   Marland Kitchen Marital Status:   Intimate Partner Violence:   . Fear of Current or Ex-Partner:   . Emotionally Abused:   Marland Kitchen Physically Abused:   . Sexually Abused:     Family History:  Family History  Problem Relation Age of Onset  . Kidney failure Mother   . Drug abuse Mother   . Drug abuse Maternal Grandmother   . Hypertension Paternal Grandfather   . Lung cancer Paternal Grandfather   . Diabetes Paternal Grandfather     Medications None  Allergies Patient has no known allergies.   Physical Exam:  BP 119/77   Pulse 68   Wt 131 lb 6.4 oz (59.6 kg)   LMP 05/18/2020   BMI 24.03 kg/m  Body mass index is 24.03  kg/m. General appearance: Well nourished, well developed female in no acute distress.  Neck:  Supple, normal appearance, and no thyromegaly  Cardiovascular: normal s1 and s2.  No murmurs, rubs or gallops. Respiratory:  Clear to auscultation bilateral. Normal respiratory effort Abdomen: positive bowel sounds and no masses, hernias; diffusely non tender to palpation, non distended Breasts: breasts appear normal, no suspicious masses, no skin or nipple changes or axillary nodes, and normal palpation. Neuro/Psych:  Normal mood and affect.  Skin:  Warm and dry.  Lymphatic:  No inguinal lymphadenopathy.   Pelvic exam: is not limited by body habitus EGBUS: within normal limits Vagina: within normal limits and with no blood or discharge in the vault Cervix: normal appearing cervix without tenderness, discharge or lesions. Uterus:  nonenlarged and non tender Adnexa:  normal adnexa and no mass, fullness, tenderness Rectovaginal: deferred  Laboratory: none  Radiology: none  Assessment: pt doing well  Plan:  1. Well woman exam Routine care - Cytology - PAP( [place])  2. Encounter for other general counseling or advice on contraception I d/w her re: increased risk of previa and DS affected child in future pregnancies. I told her that there is a good chance that the abnormal placentation was due to the child being affected by DS due to genetic issues with the pregnancy increase risk of abnormal placentation. I told her that if genetics of future children are normal that risk of repeat previa would be low. I told her that she is at higher risk of future DS affected children and this may be recurrent due to her and her husband's young age. I told her that if she's interested in genetic counseling prior to pregnancy for DNA analysis of her and her husband to let us know. She is okay with increased and just wanted to see if there was anything that could decrease her future risk/prevent any future  issues, which I told her there is not but can do cell free fetal dna testing as soon as 10 weeks and her 18 week anatomy u/s will let us know her risk of previa.   Pt to start folic acid supplementation and timed intercourse instructions given.   RTC PRN  Durene Romans MD Attending Center for [employer] farm [employer])

## 2020-05-24 LAB — CYTOLOGY - PAP
Diagnosis: NEGATIVE
Diagnosis: REACTIVE

## 2020-06-24 ENCOUNTER — Other Ambulatory Visit: Payer: Self-pay | Admitting: *Deleted

## 2020-06-24 MED ORDER — FLUCONAZOLE 150 MG PO TABS: ORAL_TABLET | ORAL | 1 refills | Status: DC

## 2021-02-22 ENCOUNTER — Other Ambulatory Visit (HOSPITAL_COMMUNITY)
Admission: RE | Admit: 2021-02-22 | Discharge: 2021-02-22 | Disposition: A | Discharge status: Home / Self Care | Payer: 59 | Source: Ambulatory Visit | Attending: Obstetrics and Gynecology | Admitting: Obstetrics and Gynecology

## 2021-02-22 ENCOUNTER — Other Ambulatory Visit: Payer: Self-pay

## 2021-02-22 ENCOUNTER — Ambulatory Visit (INDEPENDENT_AMBULATORY_CARE_PROVIDER_SITE_OTHER): Payer: 59

## 2021-02-22 VITALS — BP 113/75 | HR 78

## 2021-02-22 DIAGNOSIS — B373 Candidiasis of vulva and vagina: Secondary | ICD-10-CM | POA: Insufficient documentation

## 2021-02-22 DIAGNOSIS — Z113 Encounter for screening for infections with a predominantly sexual mode of transmission: Secondary | ICD-10-CM | POA: Insufficient documentation

## 2021-02-22 DIAGNOSIS — N898 Other specified noninflammatory disorders of vagina: Secondary | ICD-10-CM | POA: Diagnosis not present

## 2021-02-22 NOTE — Progress Notes (Signed)
SUBJECTIVE:  25 y.o. female complains of vaginal irritation for 3 days. Denies abnormal vaginal bleeding or significant pelvic pain or fever. No UTI symptoms. Denies history of known exposure to STD.  No LMP recorded.  OBJECTIVE:  She appears well, afebrile. Urine dipstick: not done.  ASSESSMENT:  Vaginal Itching     PLAN:  GC, chlamydia, trichomonas, BVAG, CVAG probe sent to lab. Treatment: To be determined once lab results are received ROV prn if symptoms persist or worsen.

## 2021-02-23 LAB — CERVICOVAGINAL ANCILLARY ONLY
Bacterial Vaginitis (gardnerella): NEGATIVE
Candida Glabrata: NEGATIVE
Candida Vaginitis: POSITIVE — AB
Chlamydia: NEGATIVE
Comment: NEGATIVE
Comment: NEGATIVE
Comment: NEGATIVE
Comment: NEGATIVE
Comment: NEGATIVE
Comment: NORMAL
Neisseria Gonorrhea: NEGATIVE
Trichomonas: NEGATIVE

## 2021-02-24 ENCOUNTER — Other Ambulatory Visit: Payer: Self-pay | Admitting: *Deleted

## 2021-02-24 MED ORDER — FLUCONAZOLE 150 MG PO TABS: ORAL_TABLET | ORAL | 1 refills | Status: DC

## 2021-03-03 NOTE — Progress Notes (Signed)
Patient was assessed and managed by nursing staff during this encounter. I have reviewed the chart and agree with the documentation and plan. I have also made any necessary editorial changes.  Makanda Bing, MD 03/03/2021 9:50 AM

## 2021-03-14 ENCOUNTER — Ambulatory Visit: Payer: 59 | Admitting: Obstetrics and Gynecology

## 2021-03-23 ENCOUNTER — Encounter: Payer: Self-pay | Admitting: Radiology

## 2021-04-28 DIAGNOSIS — H60331 Swimmer's ear, right ear: Secondary | ICD-10-CM | POA: Diagnosis not present

## 2021-05-16 ENCOUNTER — Other Ambulatory Visit: Payer: Self-pay | Admitting: *Deleted

## 2021-05-16 MED ORDER — FLUCONAZOLE 150 MG PO TABS: ORAL_TABLET | ORAL | 1 refills | Status: DC

## 2021-07-11 DIAGNOSIS — H9201 Otalgia, right ear: Secondary | ICD-10-CM | POA: Diagnosis not present

## 2021-07-12 ENCOUNTER — Ambulatory Visit (INDEPENDENT_AMBULATORY_CARE_PROVIDER_SITE_OTHER): Payer: 59 | Admitting: Obstetrics and Gynecology

## 2021-07-12 ENCOUNTER — Other Ambulatory Visit: Payer: Self-pay

## 2021-07-12 ENCOUNTER — Other Ambulatory Visit: Payer: Self-pay | Admitting: Obstetrics and Gynecology

## 2021-07-12 ENCOUNTER — Encounter: Payer: Self-pay | Admitting: Obstetrics and Gynecology

## 2021-07-12 VITALS — BP 119/70 | HR 85 | Ht 62.0 in | Wt 132.4 lb

## 2021-07-12 DIAGNOSIS — Z01419 Encounter for gynecological examination (general) (routine) without abnormal findings: Secondary | ICD-10-CM | POA: Diagnosis not present

## 2021-07-12 MED ORDER — FOLIC ACID 1 MG PO TABS
1.0000 mg | ORAL_TABLET | Freq: Every day | ORAL | 6 refills | Status: DC
Start: 2021-07-12 — End: 2022-03-06

## 2021-07-12 NOTE — Progress Notes (Signed)
Obstetrics and Gynecology Annual Patient Evaluation  Appointment Date: 07/12/2021  OBGYN Clinic: [facility] for [facility]   Primary Care Provider: Center, [facility]  Chief Complaint:  Chief Complaint  Patient presents with   Gynecologic Exam    History of Present Illness: Caroline [name] is a 25 y.o. [phone] seen for the above chief complaint.  No issues or complaints. Pt wanting to try and conceive again.  Review of Systems: A comprehensive review of systems was negative.    Patient Active Problem List   Diagnosis Date Noted   History of child affected by Down syndrome     Past Medical History:  Past Medical History:  Diagnosis Date   History of child affected by Down syndrome    Parvovirus exposure 03/26/2017   Dog sick with Parvovirus around [redacted] weeks GA Negative initial and f/u testing.    Placenta previa affecting delivery    c-section    Past Surgical History:  Past Surgical History:  Procedure Laterality Date   CESAREAN SECTION N/A 08/11/2017   Procedure: CESAREAN SECTION;  Surgeon: [place] Bing, MD;  Location: [facility] BIRTHING SUITES;  Service: Obstetrics;  Laterality: N/A;    Past Obstetrical History:  OB History  Gravida Para Term Preterm AB Living  2 1 0 1 1 1   SAB IAB Ectopic Multiple Live Births  1 0 0 0 1    # Outcome Date GA Lbr Len/2nd Weight Sex Delivery Anes PTL Lv  2 Preterm 08/11/17 107w2d  4 lb 1.1 oz (1.845 kg) F CS-LTranv Spinal  LIV  1 SAB 10/2016            Past Gynecological History: As per HPI. Periods: qmonth, regular; not heavy, painful or prolonged History of Pap Smear(s): Yes.   Last pap negative cytology 05/20/2020 She is currently using no method for contraception.   Social History:  Social History   Socioeconomic History   Marital status: Married    Spouse name: Not on file   Number of children: Not on file   Years of education: Not on file   Highest education level: Not on file   Occupational History   Not on file  Tobacco Use   Smoking status: Never   Smokeless tobacco: Never  Vaping Use   Vaping Use: Never used  Substance and Sexual Activity   Alcohol use: No    Comment: social   Drug use: No   Sexual activity: Yes    Partners: Male    Birth control/protection: None  Other Topics Concern   Not on file  Social History Narrative   Not on file   Social Determinants of Health   Financial Resource Strain: Not on file  Food Insecurity: Not on file  Transportation Needs: Not on file  Physical Activity: Not on file  Stress: Not on file  Social Connections: Not on file  Intimate Partner Violence: Not on file    Family History:  Family History  Problem Relation Age of Onset   Kidney failure Mother    Drug abuse Mother    Drug abuse Maternal Grandmother    Hypertension Paternal Grandfather    Lung cancer Paternal Grandfather    Diabetes Paternal Grandfather     Medications None   Allergies Patient has no known allergies.   Physical Exam:  BP 119/70   Pulse 85   Ht 5\' 2"  (1.575 m)   Wt 132 lb 6.4 oz (60.1 kg)   BMI 24.22 kg/m  Body mass index is 24.22 kg/m.  General appearance: Well nourished, well developed female in no acute distress.  Neck:  Supple, normal appearance, and no thyromegaly  Cardiovascular: normal s1 and s2.  No murmurs, rubs or gallops. Respiratory:  Clear to auscultation bilateral. Normal respiratory effort Abdomen: positive bowel sounds and no masses, hernias; diffusely non tender to palpation, non distended Breasts: breasts appear normal, no suspicious masses, no skin or nipple changes or axillary nodes, and normal palpation. Neuro/Psych:  Normal mood and affect.  Skin:  Warm and dry.  Lymphatic:  No inguinal lymphadenopathy.   Pelvic exam: declined  Laboratory: none  Radiology: none  Assessment: pt doing well  Plan:  1. Well woman exam Patient amneable to labs today, declines STI testing. Recommend  patient start taking folic acid supplementation. She has never had genetic testing to test for any translocations in her or her husband due to a child affected by Down's syndrome and she is okay with holding off on this - CBC - TSH - Hemoglobin A1c - Comprehensive metabolic panel - Lipid panel - Urinalysis, Routine w reflex microscopic  RTC PRN  Cornelia Copa MD Attending Center for [facility])

## 2021-07-13 LAB — TSH: TSH: 3.15 u[IU]/mL (ref 0.450–4.500)

## 2021-07-13 LAB — COMPREHENSIVE METABOLIC PANEL
ALT: 19 IU/L (ref 0–32)
AST: 18 IU/L (ref 0–40)
Albumin/Globulin Ratio: 1.8 (ref 1.2–2.2)
Albumin: 4.4 g/dL (ref 3.9–5.0)
Alkaline Phosphatase: 89 IU/L (ref 44–121)
BUN/Creatinine Ratio: 17 (ref 9–23)
BUN: 11 mg/dL (ref 6–20)
Bilirubin Total: 0.2 mg/dL (ref 0.0–1.2)
CO2: 24 mmol/L (ref 20–29)
Calcium: 9.5 mg/dL (ref 8.7–10.2)
Chloride: 102 mmol/L (ref 96–106)
Creatinine, Ser: 0.65 mg/dL (ref 0.57–1.00)
Globulin, Total: 2.4 g/dL (ref 1.5–4.5)
Glucose: 93 mg/dL (ref 65–99)
Potassium: 4.3 mmol/L (ref 3.5–5.2)
Sodium: 138 mmol/L (ref 134–144)
Total Protein: 6.8 g/dL (ref 6.0–8.5)
eGFR: 124 mL/min/{1.73_m2} (ref >59)

## 2021-07-13 LAB — CBC
Hematocrit: 42.9 % (ref 34.0–46.6)
Hemoglobin: 14.7 g/dL (ref 11.1–15.9)
MCH: 28.9 pg (ref 26.6–33.0)
MCHC: 34.3 g/dL (ref 31.5–35.7)
MCV: 84 fL (ref 79–97)
Platelets: 229 10*3/uL (ref 150–450)
RBC: 5.09 x10E6/uL (ref 3.77–5.28)
RDW: 12.7 % (ref 11.7–15.4)
WBC: 6.7 10*3/uL (ref 3.4–10.8)

## 2021-07-13 LAB — URINALYSIS, ROUTINE W REFLEX MICROSCOPIC
Bilirubin, UA: NEGATIVE
Glucose, UA: NEGATIVE
Ketones, UA: NEGATIVE
Leukocytes,UA: NEGATIVE
Nitrite, UA: NEGATIVE
Protein,UA: NEGATIVE
RBC, UA: NEGATIVE
Specific Gravity, UA: 1.009 (ref 1.005–1.030)
Urobilinogen, Ur: 0.2 mg/dL (ref 0.2–1.0)
pH, UA: 7.5 (ref 5.0–7.5)

## 2021-07-13 LAB — LIPID PANEL
Chol/HDL Ratio: 3.1 ratio (ref 0.0–4.4)
Cholesterol, Total: 147 mg/dL (ref 100–199)
HDL: 47 mg/dL (ref >39)
LDL Chol Calc (NIH): 74 mg/dL (ref 0–99)
Triglycerides: 154 mg/dL — ABNORMAL HIGH (ref 0–149)
VLDL Cholesterol Cal: 26 mg/dL (ref 5–40)

## 2021-07-13 LAB — HEMOGLOBIN A1C
Est. average glucose Bld gHb Est-mCnc: 103 mg/dL
Hgb A1c MFr Bld: 5.2 % (ref 4.8–5.6)

## 2021-07-20 ENCOUNTER — Encounter: Payer: Self-pay | Admitting: Obstetrics and Gynecology

## 2021-07-20 DIAGNOSIS — E781 Pure hyperglyceridemia: Secondary | ICD-10-CM | POA: Insufficient documentation

## 2021-07-20 HISTORY — DX: Pure hyperglyceridemia: E78.1

## 2021-09-29 ENCOUNTER — Other Ambulatory Visit: Payer: Self-pay | Admitting: *Deleted

## 2021-09-29 MED ORDER — FLUCONAZOLE 150 MG PO TABS: ORAL_TABLET | ORAL | 1 refills | Status: DC

## 2021-11-27 NOTE — L&D Delivery Note (Signed)
Delivery Note Caroline [name] is a 26 y.o. [id number] at [redacted]w[redacted]d admitted for IOL due to [place] and suspected LGA.   GBS Status: Negative/-- (10/26 6122)  Labor course:  Augmentation with: AROM. She then progressed to complete.  ROM: 8h 81m   Birth: Right mediolateral episitomy performed.  At 1835 a viable female was delivered via spontaneous vaginal delivery (Presentation: LOA;  ). Nuchal cord present: Yes.  Shoulders and body delivered in usual fashion. Infant placed directly on mom's abdomen for bonding/skin-to-skin, baby dried and stimulated. Cord clamped x 2 after 1 minute and cut by dad.  Cord blood collected.  The placenta separated spontaneously and delivered via gentle cord traction.  Pitocin infused rapidly IV per protocol.  Fundus firm with massage.  Placenta inspected and appears to be intact with a 3 VC.  Placenta/Cord with the following complications: none Sponge and instrument count were correct x2.  Intrapartum complications:  None Anesthesia:  epidural Episiotomy: mediolateral without extension Lacerations:  2nd degree Suture Repair: 2.0 3.0 vicryl EBL (mL): 350cc   Infant: APGAR (1 MIN): 9   APGAR (5 MINS): 10   APGAR (10 MINS):    Infant weight: pending  Mom to postpartum.  Baby to Couplet care / Skin to Skin. Placenta to L&D   Plans to Breastfeed Contraception:  unsure Circumcision: N/A  Note sent to [facility]: [place] for pp visit.  Myna Hidalgo, DO Attending Obstetrician & Gynecologist, [facility] for [employer], [facility]

## 2022-01-22 IMAGING — MR MR HEAD W/O CM
12 series · 47 of 48 positions shown · non-contrast
Comparison: None.

CLINICAL DATA: New onset severe daily headache, intractable chronic
migraine

EXAM:
MRI HEAD WITHOUT CONTRAST
TECHNIQUE: Multiplanar, multiecho pulse sequences of the brain and surrounding
structures were obtained without intravenous contrast.

[Series 5: ax dwi_tracew · axial · 3.0mm · 0.60mm/px · z∈[-143,-10]mm · 3 of 44 slices shown]
[im 1/44]
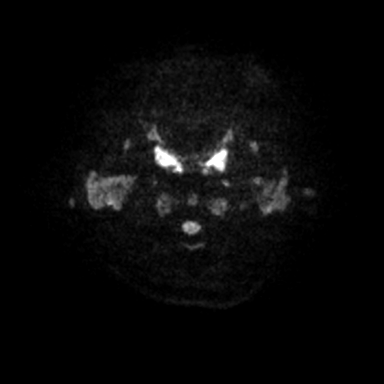
[im 22/44]
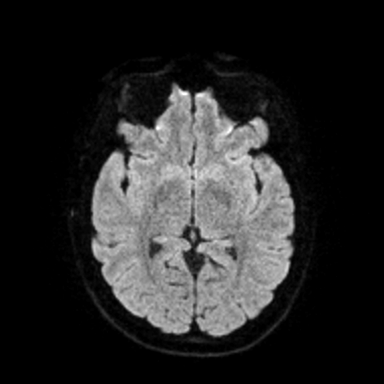
[im 44/44]
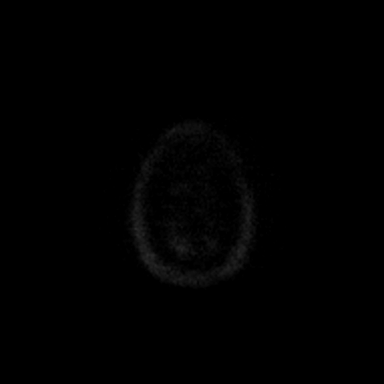

[Series 6: ax dwi_adc · axial · 3.0mm · 0.60mm/px · z∈[-143,-10]mm · 3 of 44 slices shown]
[im 1/44]
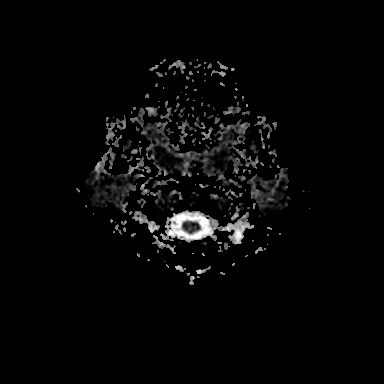
[im 22/44]
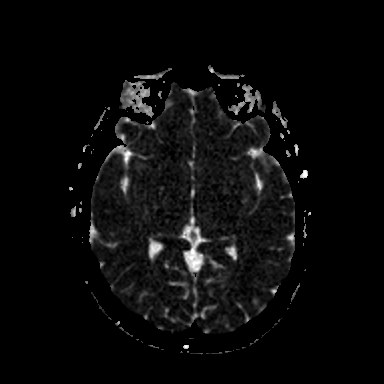
[im 44/44]
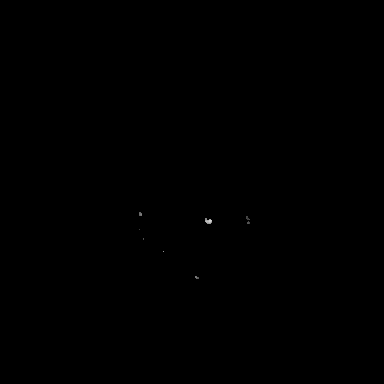

[Series 7: cor dwi_tracew · coronal · 5.0mm · 0.60mm/px · 3 of 34 slices shown]
[im 1/34]
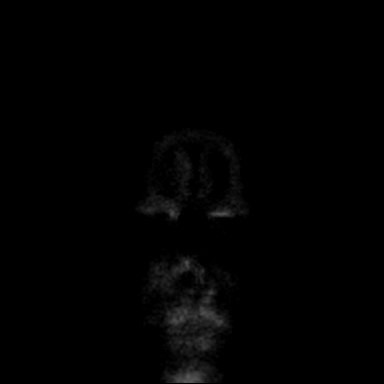
[im 17/34]
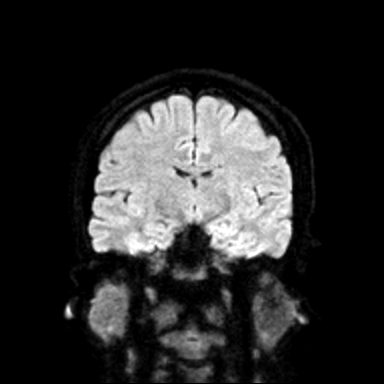
[im 34/34]
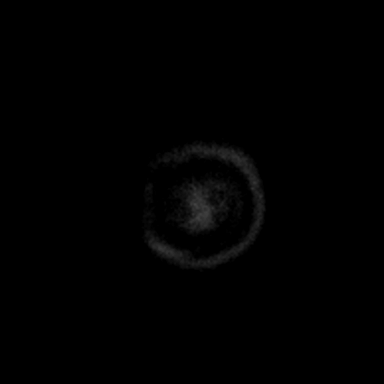

[Series 8: cor dwi_adc · coronal · 5.0mm · 0.60mm/px · 3 of 33 slices shown]
[im 1/33]
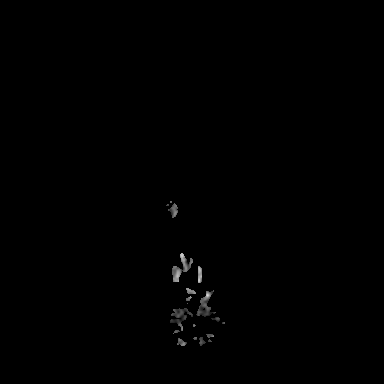
[im 17/33]
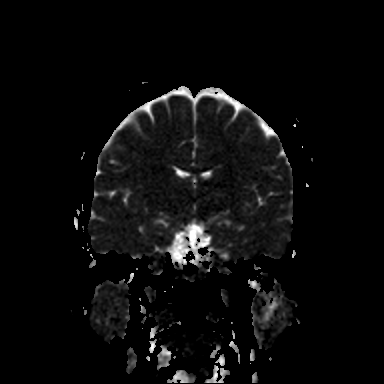
[im 33/33]
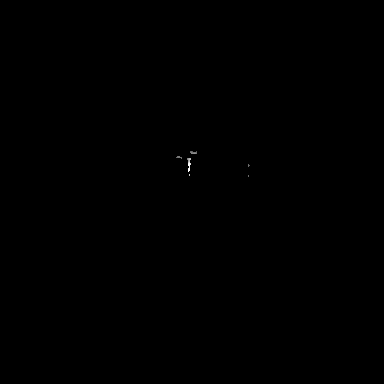

[Series 9: T1 · sagittal · 5.0mm · 0.62mm/px · 2 of 21 slices shown (1 of 2)]
[im 1/21]
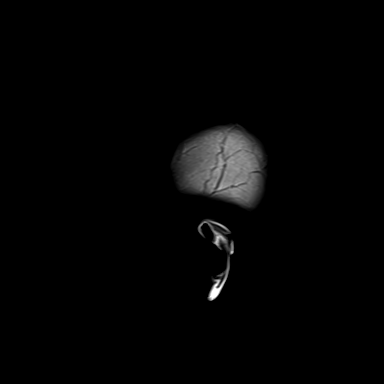
[im 21/21]
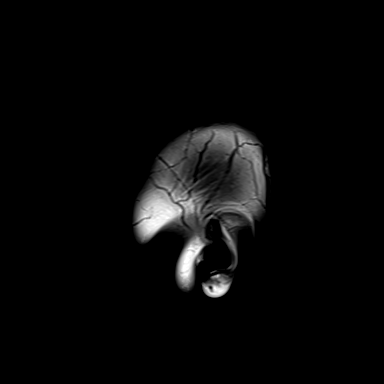

[Series 10: T2 · axial · 5.0mm · 0.53mm/px · z∈[-142,-7]mm · 2 of 25 slices shown (1 of 2)]
[im 1/25]
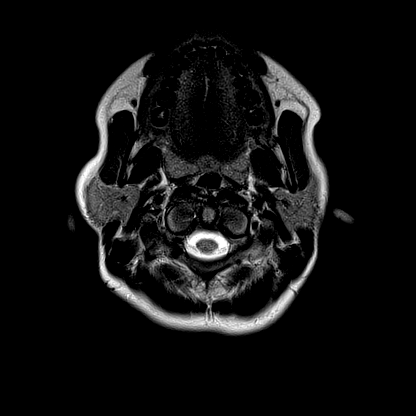
[im 25/25]
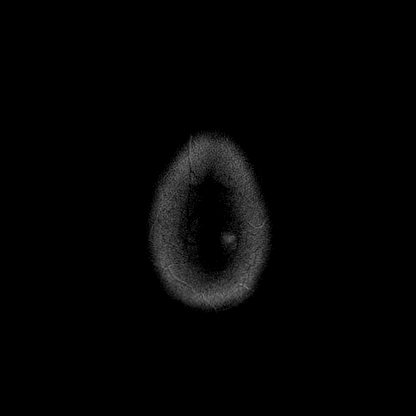

[Series 11: mag_images · axial · 3.0mm · 0.90mm/px · z∈[-159,+6]mm · 5 of 60 slices shown]
[im 1/60]
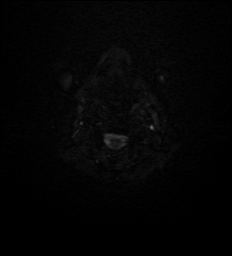
[im 15/60]
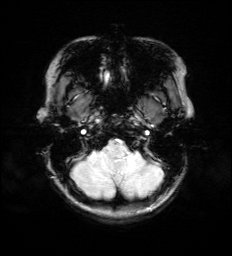
[im 30/60]
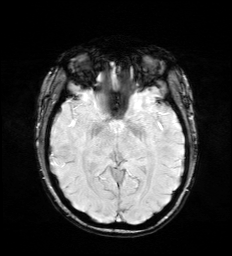
[im 45/60]
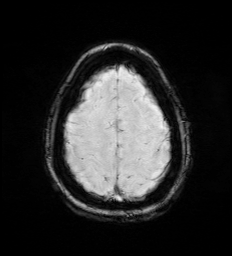
[im 60/60]
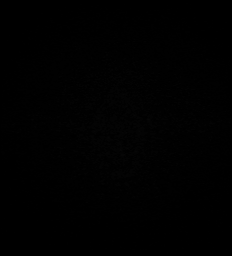

[Series 12: pha_images · axial · 3.0mm · 0.90mm/px · z∈[-159,+6]mm · 5 of 58 slices shown]
[im 1/58]
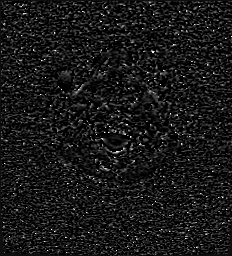
[im 15/58]
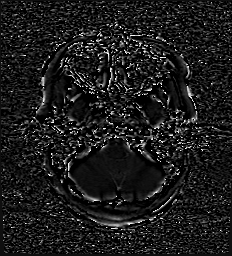
[im 29/58]
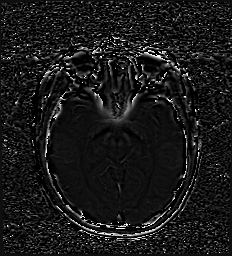
[im 43/58]
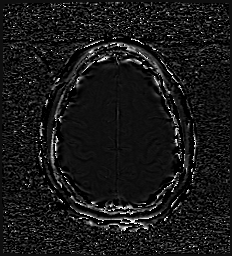
[im 58/58]
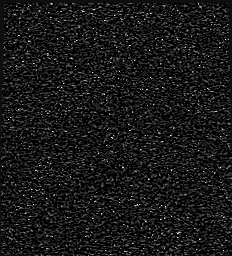

[Series 13: swi_images · axial · 3.0mm · 0.90mm/px · z∈[-159,+6]mm · 5 of 60 slices shown]
[im 1/60]
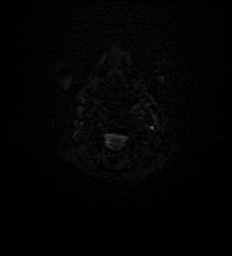
[im 15/60]
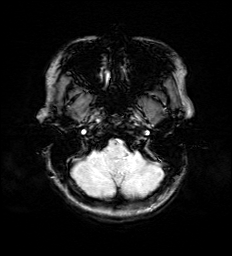
[im 30/60]
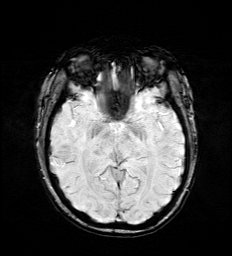
[im 45/60]
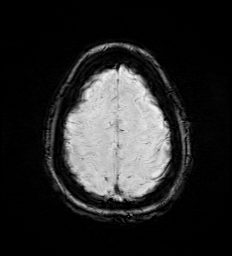
[im 60/60]
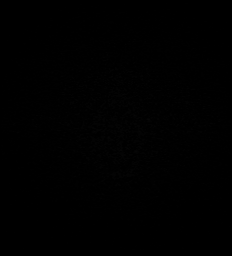

[Series 15: FLAIR · axial · 3.0mm · 0.53mm/px · z∈[-151,+1]mm · 4 of 55 slices shown]
[im 1/55]
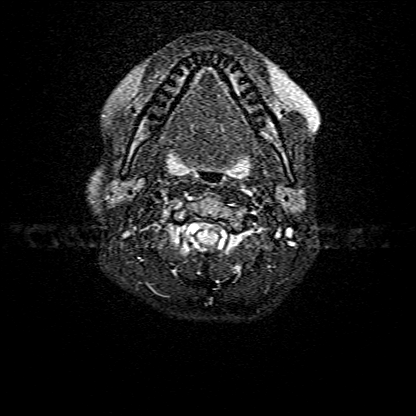
[im 19/55]
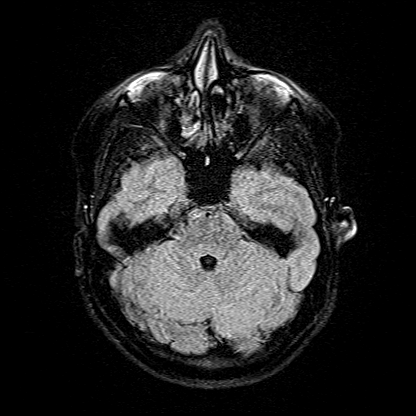
[im 37/55]
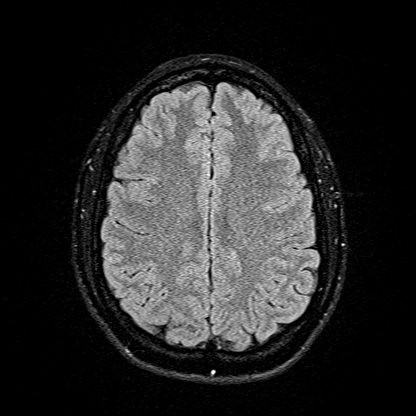
[im 55/55]
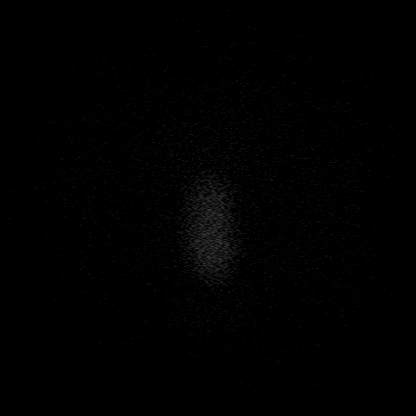

[Series 16: T1 · axial · 1.0mm · 0.98mm/px · z∈[-147,-13]mm · 10 of 144 slices shown (2 of 2)]
[im 1/144]
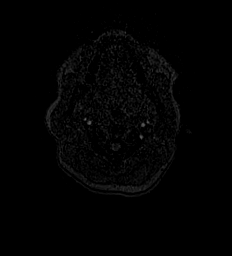
[im 15/144]
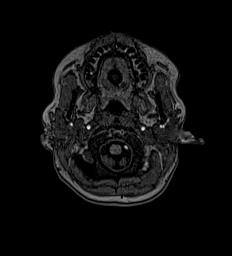
[im 29/144]
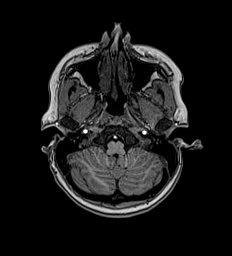
[im 43/144]
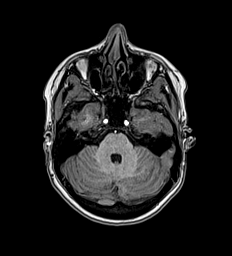
[im 58/144]
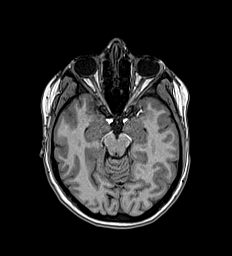
[im 72/144]
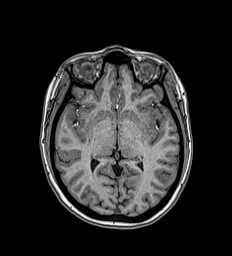
[im 86/144]
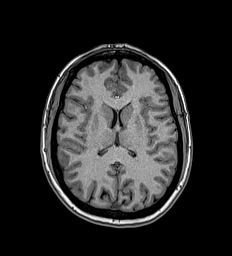
[im 101/144]
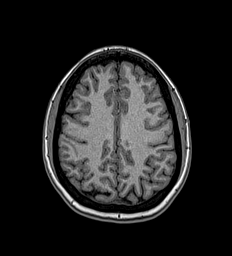
[im 115/144]
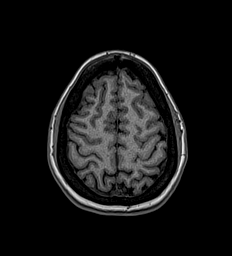
[im 144/144]
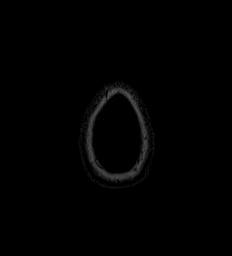

[Series 17: T2 · coronal · 5.0mm · 0.57mm/px · 2 of 27 slices shown (2 of 2)]
[im 1/27]
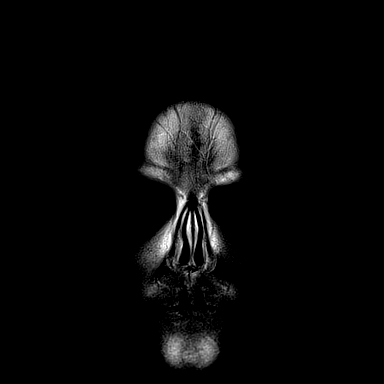
[im 27/27]
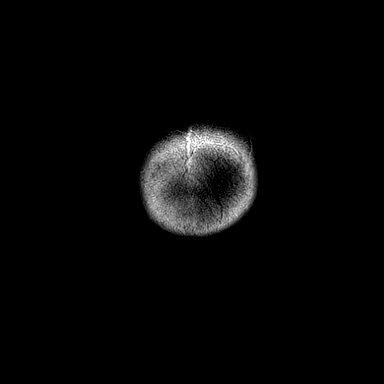

[47 of 48 positions shown; findings below may reference images not displayed]

FINDINGS: Brain: There is no acute infarction or intracranial hemorrhage.
There is no intracranial mass, mass effect, or edema. There is no
hydrocephalus or extra-axial fluid collection. Ventricles and sulci
are normal in size and configuration.

Vascular: Major vessel flow voids at the skull base are preserved.

Skull and upper cervical spine: Normal marrow signal is preserved.

Sinuses/Orbits: Paranasal sinuses are aerated. Orbits are
unremarkable.

Other: Sella is unremarkable.  Mastoid air cells are clear.
IMPRESSION: Normal MRI of the brain.

## 2022-01-31 ENCOUNTER — Other Ambulatory Visit: Payer: Self-pay | Admitting: Obstetrics & Gynecology

## 2022-03-06 ENCOUNTER — Ambulatory Visit (INDEPENDENT_AMBULATORY_CARE_PROVIDER_SITE_OTHER): Payer: 59 | Admitting: *Deleted

## 2022-03-06 ENCOUNTER — Encounter: Payer: Self-pay | Admitting: Obstetrics and Gynecology

## 2022-03-06 ENCOUNTER — Ambulatory Visit (INDEPENDENT_AMBULATORY_CARE_PROVIDER_SITE_OTHER): Payer: 59

## 2022-03-06 VITALS — BP 120/72 | HR 88 | Wt 142.0 lb

## 2022-03-06 DIAGNOSIS — O3680X0 Pregnancy with inconclusive fetal viability, not applicable or unspecified: Secondary | ICD-10-CM

## 2022-03-06 DIAGNOSIS — Z3A01 Less than 8 weeks gestation of pregnancy: Secondary | ICD-10-CM

## 2022-03-06 DIAGNOSIS — Z3481 Encounter for supervision of other normal pregnancy, first trimester: Secondary | ICD-10-CM

## 2022-03-06 DIAGNOSIS — O0990 Supervision of high risk pregnancy, unspecified, unspecified trimester: Secondary | ICD-10-CM | POA: Insufficient documentation

## 2022-03-06 NOTE — Progress Notes (Cosign Needed)
New OB Intake ? ?I explained I am completing New OB Intake today. We discussed her EDD of 10/17/22 that is based on LMP of 01/10/22. Pt is G3/P1. I reviewed her allergies, medications, Medical/Surgical/OB history, and appropriate screenings.  .  ? ?Patient Active Problem List  ? Diagnosis Date Noted  ? Hypertriglyceridemia 07/20/2021  ? History of child affected by Down syndrome   ? ? ?Concerns addressed today ? ?Delivery Plans:  ?Plans to deliver at [facility].  ? ? ?Blood Pressure Cuff  ?Pt has BP cuff  ? ? ?Anatomy US ?Explained first scheduled [place] will be around 19 weeks.  ? ?Labs ?Discussed Avelina Laine genetic screening with patient. Would like both Panorama and Horizon drawn at new OB visit. Routine prenatal labs needed. ? ? ?Placed OB Box on problem list and updated ? ? ?Patient informed that the ultrasound is considered a limited obstetric ultrasound and is not intended to be a complete ultrasound exam.  Patient also informed that the ultrasound is not being completed with the intent of assessing for fetal or placental anomalies or any pelvic abnormalities. Explained that the purpose of today's ultrasound is to assess for dating and fetal heart rate.  Patient acknowledges the purpose of the exam and the limitations of the study.    ? ? First visit review ?I reviewed new OB appt with pt. I explained she will have ob bloodwork with genetic screening. Explained pt will be seen by Dr Shawnie Pons at first visit; encounter routed to appropriate provider.  ? ? ?Scheryl Marten, RN ?03/06/2022  4:20 PM  ?

## 2022-03-07 NOTE — Progress Notes (Signed)
Patient seen and assessed by nursing staff.  Agree with documentation and plan.  

## 2022-03-29 ENCOUNTER — Other Ambulatory Visit (HOSPITAL_COMMUNITY)
Admission: RE | Admit: 2022-03-29 | Discharge: 2022-03-29 | Disposition: A | Discharge status: Home / Self Care | Payer: 59 | Source: Ambulatory Visit | Attending: Family Medicine | Admitting: Family Medicine

## 2022-03-29 ENCOUNTER — Encounter: Payer: Self-pay | Admitting: Family Medicine

## 2022-03-29 ENCOUNTER — Ambulatory Visit (INDEPENDENT_AMBULATORY_CARE_PROVIDER_SITE_OTHER): Payer: 59 | Admitting: Family Medicine

## 2022-03-29 VITALS — BP 126/78 | HR 93 | Wt 149.0 lb

## 2022-03-29 DIAGNOSIS — Z3481 Encounter for supervision of other normal pregnancy, first trimester: Secondary | ICD-10-CM | POA: Diagnosis not present

## 2022-03-29 DIAGNOSIS — O0990 Supervision of high risk pregnancy, unspecified, unspecified trimester: Secondary | ICD-10-CM | POA: Insufficient documentation

## 2022-03-29 DIAGNOSIS — O34219 Maternal care for unspecified type scar from previous cesarean delivery: Secondary | ICD-10-CM | POA: Insufficient documentation

## 2022-03-29 DIAGNOSIS — Q909 Down syndrome, unspecified: Secondary | ICD-10-CM

## 2022-03-29 NOTE — Progress Notes (Signed)
New OB ?OB panel, OB urine, GC/CC today ?Genetic screening today ?

## 2022-03-29 NOTE — Patient Instructions (Signed)
First Trimester of Pregnancy  The first trimester of pregnancy starts on the first day of your last menstrual period until the end of week 12. This is months 1 through 3 of pregnancy. A week after a sperm fertilizes an egg, the egg will implant into the wall of the uterus and begin to develop into a baby. By the end of 12 weeks, all the baby's organs will be formed and the baby will be 2-3 inches in size. Body changes during your first trimester Your body goes through many changes during pregnancy. The changes vary and generally return to normal after your baby is born. Physical changes You may gain or lose weight. Your breasts may begin to grow larger and become tender. The tissue that surrounds your nipples (areola) may become darker. Dark spots or blotches (chloasma or mask of pregnancy) may develop on your face. You may have changes in your hair. These can include thickening or thinning of your hair or changes in texture. Health changes You may feel nauseous, and you may vomit. You may have heartburn. You may develop headaches. You may develop constipation. Your gums may bleed and may be sensitive to brushing and flossing. Other changes You may tire easily. You may urinate more often. Your menstrual periods will stop. You may have a loss of appetite. You may develop cravings for certain kinds of food. You may have changes in your emotions from day to day. You may have more vivid and strange dreams. Follow these instructions at home: Medicines Follow your health care provider's instructions regarding medicine use. Specific medicines may be either safe or unsafe to take during pregnancy. Do not take any medicines unless told to by your health care provider. Take a prenatal vitamin that contains at least 600 micrograms (mcg) of folic acid. Eating and drinking Eat a healthy diet that includes fresh fruits and vegetables, whole grains, good sources of protein such as meat, eggs, or tofu,  and low-fat dairy products. Avoid raw meat and unpasteurized juice, milk, and cheese. These carry germs that can harm you and your baby. If you feel nauseous or you vomit: Eat 4 or 5 small meals a day instead of 3 large meals. Try eating a few soda crackers. Drink liquids between meals instead of during meals. You may need to take these actions to prevent or treat constipation: Drink enough fluid to keep your urine pale yellow. Eat foods that are high in fiber, such as beans, whole grains, and fresh fruits and vegetables. Limit foods that are high in fat and processed sugars, such as fried or sweet foods. Activity Exercise only as directed by your health care provider. Most people can continue their usual exercise routine during pregnancy. Try to exercise for 30 minutes at least 5 days a week. Stop exercising if you develop pain or cramping in the lower abdomen or lower back. Avoid exercising if it is very hot or humid or if you are at high altitude. Avoid heavy lifting. If you choose to, you may have sex unless your health care provider tells you not to. Relieving pain and discomfort Wear a good support bra to relieve breast tenderness. Rest with your legs elevated if you have leg cramps or low back pain. If you develop bulging veins (varicose veins) in your legs: Wear support hose as told by your health care provider. Elevate your feet for 15 minutes, 3-4 times a day. Limit salt in your diet. Safety Wear your seat belt at all times when   driving or riding in a car. Talk with your health care provider if someone is verbally or physically abusive to you. Talk with your health care provider if you are feeling sad or have thoughts of hurting yourself. Lifestyle Do not use hot tubs, steam rooms, or saunas. Do not douche. Do not use tampons or scented sanitary pads. Do not use herbal remedies, alcohol, illegal drugs, or medicines that are not approved by your health care provider. Chemicals  in these products can harm your baby. Do not use any products that contain nicotine or tobacco, such as cigarettes, e-cigarettes, and chewing tobacco. If you need help quitting, ask your health care provider. Avoid cat litter boxes and soil used by cats. These carry germs that can cause birth defects in the baby and possibly loss of the unborn baby (fetus) by miscarriage or stillbirth. General instructions During routine prenatal visits in the first trimester, your health care provider will do a physical exam, perform necessary tests, and ask you how things are going. Keep all follow-up visits. This is important. Ask for help if you have counseling or nutritional needs during pregnancy. Your health care provider can offer advice or refer you to specialists for help with various needs. Schedule a dentist appointment. At home, brush your teeth with a soft toothbrush. Floss gently. Write down your questions. Take them to your prenatal visits. Where to find more information American Pregnancy Association: americanpregnancy.org American College of Obstetricians and Gynecologists: acog.org/en/Womens%20Health/Pregnancy Office on Women's Health: womenshealth.gov/pregnancy Contact a health care provider if you have: Dizziness. A fever. Mild pelvic cramps, pelvic pressure, or nagging pain in the abdominal area. Nausea, vomiting, or diarrhea that lasts for 24 hours or longer. A bad-smelling vaginal discharge. Pain when you urinate. Known exposure to a contagious illness, such as chickenpox, measles, Zika virus, HIV, or hepatitis. Get help right away if you have: Spotting or bleeding from your vagina. Severe abdominal cramping or pain. Shortness of breath or chest pain. Any kind of trauma, such as from a fall or a car crash. New or increased pain, swelling, or redness in an arm or leg. Summary The first trimester of pregnancy starts on the first day of your last menstrual period until the end of week  12 (months 1 through 3). Eating 4 or 5 small meals a day rather than 3 large meals may help to relieve nausea and vomiting. Do not use any products that contain nicotine or tobacco, such as cigarettes, e-cigarettes, and chewing tobacco. If you need help quitting, ask your health care provider. Keep all follow-up visits. This is important. This information is not intended to replace advice given to you by your health care provider. Make sure you discuss any questions you have with your health care provider. Document Revised: 04/21/2020 Document Reviewed: 02/26/2020 Elsevier Patient Education  2023 [employer]  

## 2022-03-29 NOTE — Progress Notes (Signed)
? ?  ? ?Subjective:  ? ?Caroline [name] is a 26 y.o. [phone] at [redacted]w[redacted]d by LMP, early ultrasound being seen today for her first obstetrical visit.  Her obstetrical history is significant for  previous c-section fro bleeding previa at 32 weeks, baby with Down's syndrome . Patient does intend to breast feed. Pregnancy history fully reviewed. ? ?Patient reports fatigue, nausea, and vomiting. ? ?HISTORY: ?OB History  ?Gravida Para Term Preterm AB Living  ?3 1 0 1 1 1   ?SAB IAB Ectopic Multiple Live Births  ?1 0 0 0 1  ?  ?# Outcome Date GA Lbr Len/2nd Weight Sex Delivery Anes PTL Lv  ?3 Current           ?2 Preterm 08/11/17 [redacted]w[redacted]d  4 lb 1.1 oz (1.845 kg) F CS-LTranv Spinal  LIV  ?   Name: SATTERFIELD,GIRL Celest  ?   Apgar1: 8  Apgar5: 9  ?1 SAB 10/2016          ? Last pap smear was  04/2020 and was normal ?Past Medical History:  ?Diagnosis Date  ? History of child affected by Down syndrome   ? Parvovirus exposure 03/26/2017  ? Dog sick with Parvovirus around [redacted] weeks GA Negative initial and f/u testing.   ? Placenta previa affecting delivery   ? c-section  ? ?Past Surgical History:  ?Procedure Laterality Date  ? CESAREAN SECTION N/A 08/11/2017  ? Procedure: CESAREAN SECTION;  Surgeon: Aletha Halim, MD;  Location: [place];  Service: Obstetrics;  Laterality: N/A;  ? WISDOM TOOTH EXTRACTION Bilateral   ? ?Family History  ?Problem Relation Age of Onset  ? Kidney failure Mother   ? Drug abuse Mother   ? Drug abuse Maternal Grandmother   ? Hypertension Paternal Grandfather   ? Lung cancer Paternal Grandfather   ? Diabetes Paternal Grandfather   ? ?Social History  ? ?Tobacco Use  ? Smoking status: Never  ? Smokeless tobacco: Never  ?Vaping Use  ? Vaping Use: Never used  ?Substance Use Topics  ? Alcohol use: No  ?  Comment: social  ? Drug use: No  ? ?Allergies  ?Allergen Reactions  ? Vicks Nyquil Cold & Flu Night [Dm-Apap-Cpm]   ? ?Current Outpatient Medications on File Prior to Visit  ?Medication Sig Dispense  Refill  ? Prenatal Vit-Fe Fumarate-FA (MULTIVITAMIN-PRENATAL) 27-0.8 MG TABS tablet Take 1 tablet by mouth daily at 12 noon.    ? ?No current facility-administered medications on file prior to visit.  ? ? ? ?Exam  ? ?Vitals:  ? 03/29/22 1000  ?BP: 126/78  ?Pulse: 93  ?Weight: 149 lb (67.6 kg)  ? ?  ? ?Uterus:     ?Pelvic Exam: Perineum: no hemorrhoids, normal perineum  ? Vulva: normal external genitalia, no lesions  ? Vagina:  normal mucosa, normal discharge  ? Cervix: no lesions and normal, pap smear done.   ? Adnexa: normal adnexa and no mass, fullness, tenderness  ? Bony Pelvis: average  ?System: General: well-developed, well-nourished female in no acute distress  ? Breast:  normal appearance, no masses or tenderness  ? Skin: normal coloration and turgor, no rashes  ? Neurologic: oriented, normal, negative, normal mood  ? Extremities: normal strength, tone, and muscle mass, ROM of all joints is normal  ? HEENT PERRLA, extraocular movement intact and sclera clear, anicteric  ? Mouth/Teeth mucous membranes moist, pharynx normal without lesions and dental hygiene good  ? Neck supple and no masses  ? Cardiovascular: regular  rate and rhythm  ? Respiratory:  no respiratory distress, normal breath sounds  ? Abdomen: soft, non-tender; bowel sounds normal; no masses,  no organomegaly  ? ?  ?Assessment:  ? ?Pregnancy: [id number] ?Patient Active Problem List  ? Diagnosis Date Noted  ? Supervision of high risk pregnancy, antepartum 03/06/2022  ? Hypertriglyceridemia 07/20/2021  ? History of child affected by Down syndrome   ? ?  ?Plan:  ?1. Supervision of high risk pregnancy, antepartum ?New OB labs ?- CBC/D/Plt+RPR+Rh+ABO+RubIgG... ?- Culture, OB Urine ?- GC/Chlamydia probe amp ([place])not at [facility] ?- Genetic Screening ?- [place] MFM OB COMP + 14 WK; Future ? ?2. History of child affected by Down syndrome ?Desires genetics this time ? ? ?3. Previous cesarean delivery affecting pregnancy, antepartum ?For bleeding previa--desires  TOLAC ? ? ?Initial labs drawn. ?Continue prenatal vitamins. ?Genetic Screening discussed, NIPS: ordered. ?Ultrasound discussed; fetal anatomic survey: ordered. ?Problem list reviewed and updated. ?The nature of [place] with multiple MDs and other Advanced Practice Providers was explained to patient; also emphasized that residents, students are part of our team. ?Routine obstetric precautions reviewed. ?Return in 4 weeks (on 04/26/2022). ? ?  ? ?

## 2022-03-30 LAB — CBC/D/PLT+RPR+RH+ABO+RUBIGG...
Antibody Screen: NEGATIVE
Basophils Absolute: 0 10*3/uL (ref 0.0–0.2)
Basos: 0 %
EOS (ABSOLUTE): 0.1 10*3/uL (ref 0.0–0.4)
Eos: 1 %
HCV Ab: NONREACTIVE
HIV Screen 4th Generation wRfx: NONREACTIVE
Hematocrit: 41.3 % (ref 34.0–46.6)
Hemoglobin: 13.9 g/dL (ref 11.1–15.9)
Hepatitis B Surface Ag: NEGATIVE
Immature Grans (Abs): 0 10*3/uL (ref 0.0–0.1)
Immature Granulocytes: 1 %
Lymphocytes Absolute: 2 10*3/uL (ref 0.7–3.1)
Lymphs: 26 %
MCH: 29.4 pg (ref 26.6–33.0)
MCHC: 33.7 g/dL (ref 31.5–35.7)
MCV: 87 fL (ref 79–97)
Monocytes Absolute: 0.4 10*3/uL (ref 0.1–0.9)
Monocytes: 5 %
Neutrophils Absolute: 5.2 10*3/uL (ref 1.4–7.0)
Neutrophils: 67 %
Platelets: 237 10*3/uL (ref 150–450)
RBC: 4.73 x10E6/uL (ref 3.77–5.28)
RDW: 12.7 % (ref 11.7–15.4)
RPR Ser Ql: NONREACTIVE
Rh Factor: POSITIVE
Rubella Antibodies, IGG: 3.03 index (ref >0.99)
WBC: 7.7 10*3/uL (ref 3.4–10.8)

## 2022-03-30 LAB — GC/CHLAMYDIA PROBE AMP (~~LOC~~) NOT AT ARMC
Chlamydia: NEGATIVE
Comment: NEGATIVE
Comment: NORMAL
Neisseria Gonorrhea: NEGATIVE

## 2022-03-30 LAB — HCV INTERPRETATION

## 2022-03-31 LAB — URINE CULTURE, OB REFLEX

## 2022-03-31 LAB — CULTURE, OB URINE

## 2022-04-04 ENCOUNTER — Encounter: Payer: Self-pay | Admitting: *Deleted

## 2022-04-04 ENCOUNTER — Telehealth: Payer: Self-pay | Admitting: *Deleted

## 2022-04-04 NOTE — Telephone Encounter (Signed)
Pt informed of panorama results  ?

## 2022-04-06 ENCOUNTER — Encounter: Payer: Self-pay | Admitting: *Deleted

## 2022-04-06 ENCOUNTER — Encounter: Payer: Self-pay | Admitting: Obstetrics and Gynecology

## 2022-04-14 ENCOUNTER — Encounter: Payer: Self-pay | Admitting: Obstetrics and Gynecology

## 2022-04-14 ENCOUNTER — Other Ambulatory Visit: Payer: Self-pay | Admitting: *Deleted

## 2022-04-14 MED ORDER — TERCONAZOLE 0.8 % VA CREA: 1.0000 | TOPICAL_CREAM | Freq: Every day | VAGINAL | 0 refills | Status: DC

## 2022-04-19 ENCOUNTER — Other Ambulatory Visit: Payer: Self-pay | Admitting: *Deleted

## 2022-04-19 MED ORDER — FAMOTIDINE 20 MG PO TABS: 20.0000 mg | ORAL_TABLET | Freq: Two times a day (BID) | ORAL | 3 refills | Status: DC

## 2022-04-19 MED ORDER — DOXYLAMINE-PYRIDOXINE 10-10 MG PO TBEC: 2.0000 | DELAYED_RELEASE_TABLET | Freq: Every day | ORAL | 5 refills | Status: DC

## 2022-04-20 ENCOUNTER — Other Ambulatory Visit: Payer: Self-pay | Admitting: *Deleted

## 2022-04-20 MED ORDER — ONDANSETRON 4 MG PO TBDP: 4.0000 mg | ORAL_TABLET | Freq: Four times a day (QID) | ORAL | 1 refills | Status: DC | PRN

## 2022-04-27 ENCOUNTER — Ambulatory Visit (INDEPENDENT_AMBULATORY_CARE_PROVIDER_SITE_OTHER): Payer: 59 | Admitting: Advanced Practice Midwife

## 2022-04-27 ENCOUNTER — Encounter: Payer: Self-pay | Admitting: Advanced Practice Midwife

## 2022-04-27 VITALS — BP 103/67 | HR 76 | Wt 151.0 lb

## 2022-04-27 DIAGNOSIS — O0990 Supervision of high risk pregnancy, unspecified, unspecified trimester: Secondary | ICD-10-CM

## 2022-04-27 DIAGNOSIS — Z3A15 15 weeks gestation of pregnancy: Secondary | ICD-10-CM | POA: Diagnosis not present

## 2022-04-27 DIAGNOSIS — Z3492 Encounter for supervision of normal pregnancy, unspecified, second trimester: Secondary | ICD-10-CM | POA: Diagnosis not present

## 2022-04-27 DIAGNOSIS — O0992 Supervision of high risk pregnancy, unspecified, second trimester: Secondary | ICD-10-CM | POA: Diagnosis not present

## 2022-04-27 DIAGNOSIS — M7121 Synovial cyst of popliteal space [Baker], right knee: Secondary | ICD-10-CM

## 2022-04-27 DIAGNOSIS — G43009 Migraine without aura, not intractable, without status migrainosus: Secondary | ICD-10-CM

## 2022-04-27 DIAGNOSIS — R42 Dizziness and giddiness: Secondary | ICD-10-CM | POA: Diagnosis not present

## 2022-04-27 DIAGNOSIS — O219 Vomiting of pregnancy, unspecified: Secondary | ICD-10-CM

## 2022-04-27 NOTE — Progress Notes (Signed)
ROB [redacted]w[redacted]d.  AFP due today.    CC: Migraines that have got worse in the last week, notes dizziness and visual changes notes spots. Pt states she has a  cyst on back of knee has swelling every now and then. Lump on back of thigh after  using compression stocking Kristine Garbe. Nausea wants to discuss labs.

## 2022-04-27 NOTE — Patient Instructions (Signed)
Vomiting in First Trimester Follow these instructions at home: To help relieve your symptoms, listen to your body. Everyone is different and has different preferences. Find what works best for you. Here are some things you can try to help relieve your symptoms: Meals and snacks Eat 5-6 small meals daily instead of 3 large meals. Eating small meals and snacks can help you avoid an empty stomach. Before getting out of bed, eat a couple of crackers to avoid moving around on an empty stomach. Eat a protein-rich snack before bed. Examples include cheese and crackers, or a peanut butter sandwich made with 1 slice of whole-wheat bread and 1 tsp (5 g) of peanut butter. Eat and drink slowly. Try eating starchy foods as these are usually tolerated well. Examples include cereal, toast, bread, potatoes, pasta, rice, and pretzels. Eat at least one serving of protein with your meals and snacks. Protein options include lean meats, poultry, seafood, beans, nuts, nut butters, eggs, cheese, and yogurt. Eat or suck on things that have ginger in them. It may help to relieve nausea. Add  tsp (0.44 g) ground ginger to hot tea, or choose ginger tea.   Fluids It is important to stay hydrated. Try to: Drink small amounts of fluids often. Drink fluids 30 minutes before or after a meal to help lessen the feeling of a full stomach. Drink 100% fruit juice or an electrolyte drink. An electrolyte drink contains sodium, potassium, and chloride. Drink fluids that are cold, clear, and carbonated or sour. These include lemonade, ginger ale, lemon-lime soda, ice water, and sparkling water. Things to avoid Avoid the following: Eating foods that trigger your symptoms. These may include spicy foods, coffee, high-fat foods, very sweet foods, and acidic foods. Drinking more than 1 cup of fluid at a time. Skipping meals. Nausea can be more intense on an empty stomach. If you cannot tolerate food, do not force it. Try sucking on ice  chips or other frozen items and make up for missed calories later. Lying down within 2 hours after eating. Being exposed to environmental triggers. These may include food smells, smoky rooms, closed spaces, rooms with strong smells, warm or humid places, overly loud and noisy rooms, and rooms with motion or flickering lights. Try eating meals in a well-ventilated area that is free of strong smells. Making quick and sudden changes in your movement. Taking iron pills and multivitamins that contain iron. If you take prescription iron pills, do not stop taking them unless your health care provider approves. Preparing food. The smell of food can spoil your appetite or trigger nausea. General instructions Brush your teeth or use a mouth rinse after meals. Take over-the-counter and prescription medicines only as told by your health care provider. Follow instructions from your health care provider about eating or drinking restrictions. Talk with your health care provider about starting a supplement of vitamin B6. Continue to take your prenatal vitamins as told by your health care provider. If you are having trouble taking your prenatal vitamins, talk with your health care provider about other options. Keep all follow-up visits. This is important. Follow-up visits include prenatal visits. Contact a health care provider if: You have pain in your abdomen. You have a severe headache. You have vision problems. You are losing weight. You feel weak or dizzy. You cannot eat or drink without vomiting, especially if this goes on for a full day. Get help right away if: You cannot drink fluids without vomiting. You vomit blood. You have constant   nausea and vomiting. You are very weak. You faint. You have a fever and your symptoms suddenly get worse. Summary Making some changes to your eating habits may help relieve nausea and vomiting. This condition may be managed with lifestyle changes and medicines as  prescribed by your health care provider. If medicines do not help relieve nausea and vomiting, you may need to receive fluids through an IV at the hospital. This information is not intended to replace advice given to you by your health care provider. Make sure you discuss any questions you have with your health care provider. Document Revised: 06/07/2020 Document Reviewed: 06/07/2020 Elsevier Patient Education  2021 Elsevier Inc.  

## 2022-04-27 NOTE — Progress Notes (Signed)
   PRENATAL VISIT NOTE  Subjective:  Caroline [name] is a 26 y.o. [phone] at [redacted]w[redacted]d being seen today for ongoing prenatal care.  She is currently monitored for the following issues for this low-risk pregnancy and has History of child affected by Down syndrome; Hypertriglyceridemia; Supervision of high risk pregnancy, antepartum; Headache disorder; and Previous cesarean delivery affecting pregnancy, antepartum on their problem list.  Patient reports  ongoing vomiting, typically after dinner .  Patient is taking Zofran ODT but not taking it every day. She drinks approximately 3 bottles of water per day, states she used [facility] was her primary source of hydration in her previous pregnancy.   She endorses worsening migraines, now accompanied by dizziness, fatigue. She denies chest pain, weakness, syncope. Contractions: Not present. Vag. Bleeding: None.  Movement: Present. Denies leaking of fluid.   The following portions of the patient's history were reviewed and updated as appropriate: allergies, current medications, past family history, past medical history, past social history, past surgical history and problem list. Problem list updated.  Objective:   Vitals:   04/27/22 0834  BP: 103/67  Pulse: 76  Weight: 151 lb (68.5 kg)    Fetal Status: Fetal Heart Rate (bpm): 168   Movement: Present     General:  Alert, oriented and cooperative. Patient is in no acute distress.  Skin: Skin is warm and dry. No rash noted.   Cardiovascular: Normal heart rate noted  Respiratory: Normal respiratory effort, no problems with respiration noted  Abdomen: Soft, gravid, appropriate for gestational age.  Pain/Pressure: Present     Pelvic: Cervical exam deferred        Extremities: Normal range of motion.  Edema: None  Mental Status: Normal mood and affect. Normal behavior. Normal judgment and thought content.   Assessment and Plan:  Pregnancy: [id number] at [redacted]w[redacted]d  1. Supervision of high risk  pregnancy, antepartum - AFP declined today - Need to schedule anatomy scan. Preemptive discussion regarding possible need to schedule additional ultrasound for completion of fetal anatomy  2. [redacted] weeks gestation of pregnancy   3. Dizziness - Likely related to low hydration with water, recurrent vomiting  - Basic metabolic panel  4. Nausea and vomiting in pregnancy prior to [redacted] weeks gestation - No ketones on urine dip, weight stable - Reviewed pathophysiology of pregnancy, baby gets best nutrients even if pregnant  - Given recurrent vomiting after dinner, recommended taking Zofran 30 min before dinner every day. Start Pepcid BID - Advised snacking/grazing throughout day  5. Migraine without aura and without status migrainosus, not intractable - Expect improvement with changes to medication management and nutrition regimen - Can refer to [facility] as needed  6. Synovial cyst of right popliteal space - Present x 2 years, not currently a problem  Preterm labor symptoms and general obstetric precautions including but not limited to vaginal bleeding, contractions, leaking of fluid and fetal movement were reviewed in detail with the patient. Please refer to After Visit Summary for other counseling recommendations.  Return in about 4 weeks (around 05/25/2022) for Please schedule anatomy scan .  Future Appointments  Date Time Provider Department Center  05/25/2022  8:35 AM Calvert Cantor, [place] [facility]  05/26/2022  1:00 PM [facility] US1 [facility]  06/22/2022  8:35 AM Calvert Cantor, CNM [facility]  07/20/2022  9:35 AM Calvert Cantor, CNM [facility]    Calvert Cantor, [place]

## 2022-04-28 LAB — AFP, SERUM, OPEN SPINA BIFIDA
AFP MoM: 0.63
AFP Value: 19.4 ng/mL
Gest. Age on Collection Date: 15.2 weeks
Maternal Age At EDD: 28.7 yr
OSBR Risk 1 IN: 10000
Test Results:: NEGATIVE
Weight: 151 [lb_av]

## 2022-04-28 LAB — BASIC METABOLIC PANEL
BUN/Creatinine Ratio: 12 (ref 9–23)
BUN: 6 mg/dL (ref 6–20)
CO2: 19 mmol/L — ABNORMAL LOW (ref 20–29)
Calcium: 9.2 mg/dL (ref 8.7–10.2)
Chloride: 101 mmol/L (ref 96–106)
Creatinine, Ser: 0.51 mg/dL — ABNORMAL LOW (ref 0.57–1.00)
Glucose: 88 mg/dL (ref 70–99)
Potassium: 4 mmol/L (ref 3.5–5.2)
Sodium: 137 mmol/L (ref 134–144)
eGFR: 130 mL/min/{1.73_m2} (ref >59)

## 2022-05-01 ENCOUNTER — Encounter: Payer: Self-pay | Admitting: Advanced Practice Midwife

## 2022-05-22 ENCOUNTER — Telehealth: Payer: Self-pay

## 2022-05-25 ENCOUNTER — Ambulatory Visit (INDEPENDENT_AMBULATORY_CARE_PROVIDER_SITE_OTHER): Payer: 59 | Admitting: Advanced Practice Midwife

## 2022-05-25 VITALS — BP 99/65 | HR 69 | Wt 153.0 lb

## 2022-05-25 DIAGNOSIS — O219 Vomiting of pregnancy, unspecified: Secondary | ICD-10-CM

## 2022-05-25 DIAGNOSIS — O0990 Supervision of high risk pregnancy, unspecified, unspecified trimester: Secondary | ICD-10-CM

## 2022-05-25 DIAGNOSIS — Z3A19 19 weeks gestation of pregnancy: Secondary | ICD-10-CM

## 2022-05-25 NOTE — Progress Notes (Signed)
   PRENATAL VISIT NOTE  Subjective:  Caroline [name] is a 26 y.o. [phone] at [redacted]w[redacted]d being seen today for ongoing prenatal care.  She is currently monitored for the following issues for this high-risk pregnancy and has History of child affected by Down syndrome; Hypertriglyceridemia; Supervision of high risk pregnancy, antepartum; Headache disorder; and Previous cesarean delivery affecting pregnancy, antepartum on their problem list.  Patient reports no complaints.  Contractions: Not present. Vag. Bleeding: None.  Movement: Present. Denies leaking of fluid.   The following portions of the patient's history were reviewed and updated as appropriate: allergies, current medications, past family history, past medical history, past social history, past surgical history and problem list. Problem list updated.  Objective:   Vitals:   05/25/22 0832  BP: 99/65  Pulse: 69  Weight: 153 lb (69.4 kg)    Fetal Status: Fetal Heart Rate (bpm): 165   Movement: Present     General:  Alert, oriented and cooperative. Patient is in no acute distress.  Skin: Skin is warm and dry. No rash noted.   Cardiovascular: Normal heart rate noted  Respiratory: Normal respiratory effort, no problems with respiration noted  Abdomen: Soft, gravid, appropriate for gestational age.  Pain/Pressure: Present     Pelvic: Cervical exam deferred        Extremities: Normal range of motion.  Edema: None  Mental Status: Normal mood and affect. Normal behavior. Normal judgment and thought content.   Assessment and Plan:  Pregnancy: [id number] at [redacted]w[redacted]d  1. Supervision of high risk pregnancy, antepartum - Routine care, LR NIPS, AFP Neg - Anatomy scan tomorrow!  2. Nausea and vomiting in pregnancy prior to [redacted] weeks gestation - Resolved !!!  3. [redacted] weeks gestation of pregnancy   Preterm labor symptoms and general obstetric precautions including but not limited to vaginal bleeding, contractions, leaking of fluid and fetal  movement were reviewed in detail with the patient. Please refer to After Visit Summary for other counseling recommendations.  Return in about 4 weeks (around 06/22/2022).  Future Appointments  Date Time Provider Department Center  05/26/2022 12:45 PM [facility]  05/26/2022  1:00 PM [facility] US1 [facility]  06/22/2022  8:35 AM Calvert Cantor, CNM [facility]  07/20/2022  9:35 AM Calvert Cantor, CNM [facility]  08/03/2022  8:35 AM Calvert Cantor, CNM [facility]  08/17/2022  8:35 AM Calvert Cantor, CNM [facility]    Calvert Cantor, CNM

## 2022-05-26 ENCOUNTER — Other Ambulatory Visit: Payer: Self-pay | Admitting: *Deleted

## 2022-05-26 ENCOUNTER — Ambulatory Visit: Payer: 59 | Admitting: *Deleted

## 2022-05-26 ENCOUNTER — Ambulatory Visit: Payer: 59 | Attending: Family Medicine

## 2022-05-26 VITALS — BP 119/66 | HR 80

## 2022-05-26 DIAGNOSIS — O0990 Supervision of high risk pregnancy, unspecified, unspecified trimester: Secondary | ICD-10-CM | POA: Insufficient documentation

## 2022-05-26 DIAGNOSIS — Z362 Encounter for other antenatal screening follow-up: Secondary | ICD-10-CM

## 2022-05-26 DIAGNOSIS — Z3A19 19 weeks gestation of pregnancy: Secondary | ICD-10-CM | POA: Insufficient documentation

## 2022-05-26 DIAGNOSIS — Z363 Encounter for antenatal screening for malformations: Secondary | ICD-10-CM | POA: Diagnosis not present

## 2022-05-26 DIAGNOSIS — O34219 Maternal care for unspecified type scar from previous cesarean delivery: Secondary | ICD-10-CM | POA: Diagnosis present

## 2022-05-26 DIAGNOSIS — O09212 Supervision of pregnancy with history of pre-term labor, second trimester: Secondary | ICD-10-CM | POA: Insufficient documentation

## 2022-06-20 ENCOUNTER — Encounter: Payer: Self-pay | Admitting: Advanced Practice Midwife

## 2022-06-22 ENCOUNTER — Telehealth: Payer: Self-pay

## 2022-06-22 ENCOUNTER — Ambulatory Visit (INDEPENDENT_AMBULATORY_CARE_PROVIDER_SITE_OTHER): Payer: 59 | Admitting: Advanced Practice Midwife

## 2022-06-22 VITALS — BP 106/67 | HR 96 | Wt 156.0 lb

## 2022-06-22 DIAGNOSIS — O26899 Other specified pregnancy related conditions, unspecified trimester: Secondary | ICD-10-CM

## 2022-06-22 DIAGNOSIS — Z3A23 23 weeks gestation of pregnancy: Secondary | ICD-10-CM

## 2022-06-22 DIAGNOSIS — Z98891 History of uterine scar from previous surgery: Secondary | ICD-10-CM

## 2022-06-22 DIAGNOSIS — O0990 Supervision of high risk pregnancy, unspecified, unspecified trimester: Secondary | ICD-10-CM

## 2022-06-22 DIAGNOSIS — R109 Unspecified abdominal pain: Secondary | ICD-10-CM

## 2022-06-22 NOTE — Progress Notes (Signed)
   PRENATAL VISIT NOTE  Subjective:  Caroline [name] is a 26 y.o. [phone] at [redacted]w[redacted]d being seen today for ongoing prenatal care.  She is currently monitored for the following issues for this high-risk pregnancy and has History of child affected by Down syndrome; Hypertriglyceridemia; Supervision of high risk pregnancy, antepartum; Headache disorder; and Previous cesarean delivery affecting pregnancy, antepartum on their problem list.  Patient reports  one episode of lower abdominal cramping. Pain consistently resolves with rest and hydration. Patient states she spent half of Monday outside attending a funeral and she thinks this was the cause of her cramping. She is working on changing her daily routines to reduce physical demand e.g. planning restaurant food to avoid prolonged standing at hot stove .  Contractions: Irritability. Vag. Bleeding: None.  Movement: Present. Denies leaking of fluid.   The following portions of the patient's history were reviewed and updated as appropriate: allergies, current medications, past family history, past medical history, past social history, past surgical history and problem list. Problem list updated.  Objective:   Vitals:   06/22/22 0823  BP: 106/67  Pulse: 96  Weight: 156 lb (70.8 kg)    Fetal Status: Fetal Heart Rate (bpm): 145   Movement: Present     General:  Alert, oriented and cooperative. Patient is in no acute distress.  Skin: Skin is warm and dry. No rash noted.   Cardiovascular: Normal heart rate noted  Respiratory: Normal respiratory effort, no problems with respiration noted  Abdomen: Soft, gravid, appropriate for gestational age.  Pain/Pressure: Absent     Pelvic: Cervical exam deferred        Extremities: Normal range of motion.  Edema: None  Mental Status: Normal mood and affect. Normal behavior. Normal judgment and thought content.   Assessment and Plan:  Pregnancy: G3P0111 at [redacted]w[redacted]d  1. Supervision of high risk pregnancy,  antepartum - Routine care  2. Abdominal cramping affecting pregnancy - Increase PO hydration with water to minimum 8 bottles per day, consider flavor additives to help - Reviewed cramping 2/2/ dehydration vs preterm labor, indications for evaluation in MAU   3. [redacted] weeks gestation of pregnancy   4. Hx of cesarean section - Desires TOLAC  Preterm labor symptoms and general obstetric precautions including but not limited to vaginal bleeding, contractions, leaking of fluid and fetal movement were reviewed in detail with the patient. Please refer to After Visit Summary for other counseling recommendations.   RTC 4 weeks for fasting GTT, for MD to sign TOLAC consent  Future Appointments  Date Time Provider Department Center  06/30/2022 12:45 PM [facility] NURSE [facility]  06/30/2022  1:00 PM [facility] US1 [facility]  07/20/2022  9:35 AM Calvert Cantor, CNM [facility]  08/03/2022  8:35 AM Calvert Cantor, CNM [facility]  08/17/2022  8:35 AM Calvert Cantor, CNM [facility]  08/31/2022  8:35 AM Calvert Cantor, CNM [facility]  09/14/2022  8:35 AM Calvert Cantor, CNM [facility]  09/21/2022  8:35 AM [place] Bing, MD [facility]    Calvert Cantor, CNM

## 2022-06-22 NOTE — Telephone Encounter (Signed)
Left message for pt to call the office back regarding her next appointment moved to the provider schedule.

## 2022-06-30 ENCOUNTER — Ambulatory Visit: Payer: 59 | Attending: Obstetrics and Gynecology

## 2022-06-30 ENCOUNTER — Ambulatory Visit: Payer: 59 | Admitting: *Deleted

## 2022-06-30 VITALS — BP 108/56 | HR 80

## 2022-06-30 DIAGNOSIS — O34219 Maternal care for unspecified type scar from previous cesarean delivery: Secondary | ICD-10-CM | POA: Diagnosis not present

## 2022-06-30 DIAGNOSIS — Z3A24 24 weeks gestation of pregnancy: Secondary | ICD-10-CM

## 2022-06-30 DIAGNOSIS — O09212 Supervision of pregnancy with history of pre-term labor, second trimester: Secondary | ICD-10-CM

## 2022-06-30 DIAGNOSIS — Z362 Encounter for other antenatal screening follow-up: Secondary | ICD-10-CM | POA: Diagnosis not present

## 2022-06-30 DIAGNOSIS — O0990 Supervision of high risk pregnancy, unspecified, unspecified trimester: Secondary | ICD-10-CM | POA: Insufficient documentation

## 2022-06-30 DIAGNOSIS — O09292 Supervision of pregnancy with other poor reproductive or obstetric history, second trimester: Secondary | ICD-10-CM

## 2022-07-06 ENCOUNTER — Other Ambulatory Visit: Payer: Self-pay | Admitting: Obstetrics & Gynecology

## 2022-07-13 ENCOUNTER — Encounter: Payer: Self-pay | Admitting: Obstetrics and Gynecology

## 2022-07-18 ENCOUNTER — Encounter: Payer: Self-pay | Admitting: Obstetrics and Gynecology

## 2022-07-18 ENCOUNTER — Ambulatory Visit (INDEPENDENT_AMBULATORY_CARE_PROVIDER_SITE_OTHER): Payer: 59 | Admitting: Obstetrics and Gynecology

## 2022-07-18 VITALS — BP 104/66 | HR 84 | Wt 158.0 lb

## 2022-07-18 DIAGNOSIS — O3660X0 Maternal care for excessive fetal growth, unspecified trimester, not applicable or unspecified: Secondary | ICD-10-CM | POA: Insufficient documentation

## 2022-07-18 DIAGNOSIS — O34219 Maternal care for unspecified type scar from previous cesarean delivery: Secondary | ICD-10-CM

## 2022-07-18 DIAGNOSIS — O0990 Supervision of high risk pregnancy, unspecified, unspecified trimester: Secondary | ICD-10-CM | POA: Diagnosis not present

## 2022-07-18 DIAGNOSIS — Z3A27 27 weeks gestation of pregnancy: Secondary | ICD-10-CM

## 2022-07-18 DIAGNOSIS — Q909 Down syndrome, unspecified: Secondary | ICD-10-CM

## 2022-07-18 NOTE — Progress Notes (Signed)
   PRENATAL VISIT NOTE  Subjective:  Caroline [name] is a 26 y.o. [phone] at [redacted]w[redacted]d being seen today for ongoing prenatal care.  She is currently monitored for the following issues for this low-risk pregnancy and has History of child affected by Down syndrome; Hypertriglyceridemia; Supervision of high risk pregnancy, antepartum; Headache disorder; Previous cesarean delivery affecting pregnancy, antepartum; and Excessive fetal growth affecting management of mother, antepartum on their problem list.  Patient reports left umbilical soreness with exertion.  Contractions: Not present. Vag. Bleeding: None.  Movement: Present. Denies leaking of fluid.   The following portions of the patient's history were reviewed and updated as appropriate: allergies, current medications, past family history, past medical history, past social history, past surgical history and problem list.   Objective:   Vitals:   07/18/22 0939  BP: 104/66  Pulse: 84  Weight: 158 lb (71.7 kg)    Fetal Status: Fetal Heart Rate (bpm): 160 Fundal Height: 28 cm Movement: Present     General:  Alert, oriented and cooperative. Patient is in no acute distress.  Skin: Skin is warm and dry. No rash noted.   Cardiovascular: Normal heart rate noted  Respiratory: Normal respiratory effort, no problems with respiration noted  Abdomen: Nttp. No hernia. Soft, gravid, appropriate for gestational age.  Pain/Pressure: Present     Pelvic: Cervical exam deferred        Extremities: Normal range of motion.  Edema: None  Mental Status: Normal mood and affect. Normal behavior. Normal judgment and thought content.   Assessment and Plan:  Pregnancy: [id number] at [redacted]w[redacted]d 1. Supervision of high risk pregnancy, antepartum Routine care. Recommend abdominal exercises (pregnancy yoga, pregnancy abdominal exercises) for any rectus laxity - Glucose Tolerance, 2 Hours w/1 Hour - CBC - RPR - HIV Antibody (routine testing w rflx)  2. [redacted] weeks gestation  of pregnancy - [place] MFM OB FOLLOW UP; Future  3. Previous cesarean delivery affecting pregnancy, antepartum Tolac form signed 8/22  4. History of child affected by Down syndrome Low panorama this pregnancy  5. Excessive fetal growth affecting management of pregnancy, antepartum, single or unspecified fetus F/u gtt. 34wk surveillance scan ordered - US MFM OB FOLLOW UP; Future  Preterm labor symptoms and general obstetric precautions including but not limited to vaginal bleeding, contractions, leaking of fluid and fetal movement were reviewed in detail with the patient. Please refer to After Visit Summary for other counseling recommendations.   No follow-ups on file.  Future Appointments  Date Time Provider Department Center  08/03/2022  8:35 AM Calvert Cantor, CNM [facility]  08/17/2022  8:35 AM Calvert Cantor, CNM [facility]  08/31/2022  8:35 AM Calvert Cantor, CNM [facility]  09/14/2022  8:35 AM Calvert Cantor, CNM [facility]  09/21/2022  8:35 AM [place] Bing, MD [facility]    [place] Bing, MD

## 2022-07-18 NOTE — Progress Notes (Signed)
ROB 27w  CC: area on belly that is bruised and causing discomfort.   T-Dap offered: Declines

## 2022-07-19 LAB — CBC
Hematocrit: 32.1 % — ABNORMAL LOW (ref 34.0–46.6)
Hemoglobin: 10.5 g/dL — ABNORMAL LOW (ref 11.1–15.9)
MCH: 27.6 pg (ref 26.6–33.0)
MCHC: 32.7 g/dL (ref 31.5–35.7)
MCV: 84 fL (ref 79–97)
Platelets: 214 10*3/uL (ref 150–450)
RBC: 3.81 x10E6/uL (ref 3.77–5.28)
RDW: 13.1 % (ref 11.7–15.4)
WBC: 7.9 10*3/uL (ref 3.4–10.8)

## 2022-07-19 LAB — GLUCOSE TOLERANCE, 2 HOURS W/ 1HR
Glucose, 1 hour: 161 mg/dL (ref 70–179)
Glucose, 2 hour: 144 mg/dL (ref 70–152)
Glucose, Fasting: 82 mg/dL (ref 70–91)

## 2022-07-19 LAB — RPR: RPR Ser Ql: NONREACTIVE

## 2022-07-19 LAB — HIV ANTIBODY (ROUTINE TESTING W REFLEX): HIV Screen 4th Generation wRfx: NONREACTIVE

## 2022-07-20 ENCOUNTER — Encounter: Payer: 59 | Admitting: Advanced Practice Midwife

## 2022-07-20 MED ORDER — FERROUS SULFATE 325 (65 FE) MG PO TABS: 325.0000 mg | ORAL_TABLET | ORAL | 0 refills | Status: DC

## 2022-07-20 NOTE — Addendum Note (Signed)
Addended by: [place] Bing on: 07/20/2022 01:54 PM   Modules accepted: Orders

## 2022-08-03 ENCOUNTER — Ambulatory Visit (INDEPENDENT_AMBULATORY_CARE_PROVIDER_SITE_OTHER): Payer: 59 | Admitting: Advanced Practice Midwife

## 2022-08-03 VITALS — BP 99/62 | HR 93 | Wt 161.0 lb

## 2022-08-03 DIAGNOSIS — O34219 Maternal care for unspecified type scar from previous cesarean delivery: Secondary | ICD-10-CM

## 2022-08-03 DIAGNOSIS — O0990 Supervision of high risk pregnancy, unspecified, unspecified trimester: Secondary | ICD-10-CM

## 2022-08-03 DIAGNOSIS — O219 Vomiting of pregnancy, unspecified: Secondary | ICD-10-CM

## 2022-08-03 DIAGNOSIS — Z3A29 29 weeks gestation of pregnancy: Secondary | ICD-10-CM

## 2022-08-03 DIAGNOSIS — O0993 Supervision of high risk pregnancy, unspecified, third trimester: Secondary | ICD-10-CM

## 2022-08-03 DIAGNOSIS — O99013 Anemia complicating pregnancy, third trimester: Secondary | ICD-10-CM

## 2022-08-03 MED ORDER — PROMETHAZINE HCL 12.5 MG PO TABS: 12.5000 mg | ORAL_TABLET | Freq: Four times a day (QID) | ORAL | 0 refills | Status: DC | PRN

## 2022-08-03 NOTE — Progress Notes (Signed)
   PRENATAL VISIT NOTE  Subjective:  Caroline [name] is a 26 y.o. [phone] at [redacted]w[redacted]d being seen today for ongoing prenatal care.  She is currently monitored for the following issues for this low-risk pregnancy and has History of child affected by Down syndrome; Hypertriglyceridemia; Supervision of high risk pregnancy, antepartum; Headache disorder; Previous cesarean delivery affecting pregnancy, antepartum; and Excessive fetal growth affecting management of mother, antepartum on their problem list.  Patient reports  irregular vomiting triggered by her iron pill. She requests antiemetic to facilitate adherence .  Contractions: Irritability. Vag. Bleeding: None.  Movement: Present. Denies leaking of fluid.   The following portions of the patient's history were reviewed and updated as appropriate: allergies, current medications, past family history, past medical history, past social history, past surgical history and problem list. Problem list updated.  Objective:   Vitals:   08/03/22 0825  BP: 99/62  Pulse: 93  Weight: 161 lb (73 kg)    Fetal Status: Fetal Heart Rate (bpm): 154 Fundal Height: 30 cm Movement: Present     General:  Alert, oriented and cooperative. Patient is in no acute distress.  Skin: Skin is warm and dry. No rash noted.   Cardiovascular: Normal heart rate noted  Respiratory: Normal respiratory effort, no problems with respiration noted  Abdomen: Soft, gravid, appropriate for gestational age.  Pain/Pressure: Absent     Pelvic: Cervical exam deferred        Extremities: Normal range of motion.  Edema: Trace  Mental Status: Normal mood and affect. Normal behavior. Normal judgment and thought content.   Assessment and Plan:  Pregnancy: [id number] at [redacted]w[redacted]d  1. Supervision of high risk pregnancy, antepartum - Doing so well! No acute complaints  2. Previous cesarean delivery affecting pregnancy, antepartum - TOLAC signed 07/18/22  3. Anemia during pregnancy in third  trimester - PO Fe rx by Dr. Vergie Living, taking with orange juice in the evening or at bedtime - New rx Phenergan, take 30 min before Iron, anticipatory guidance mild sleepiness  4. Vomiting during pregnancy in third trimester - Only with PO FE. New regimen as above  5. [redacted] weeks gestation of pregnancy - Daily kick counts  Preterm labor symptoms and general obstetric precautions including but not limited to vaginal bleeding, contractions, leaking of fluid and fetal movement were reviewed in detail with the patient. Please refer to After Visit Summary for other counseling recommendations.    Future Appointments  Date Time Provider Department Center  08/11/2022 12:30 PM [facility] NURSE [facility]  08/11/2022 12:45 PM [facility] US5 [facility]  08/17/2022  8:35 AM Calvert Cantor, CNM [facility]  08/31/2022  8:35 AM Calvert Cantor, CNM [facility]  09/14/2022  8:35 AM Calvert Cantor, CNM [facility]  09/21/2022  8:35 AM Brewster Bing, MD [facility]  09/28/2022 10:35 AM Calvert Cantor, CNM [facility]  10/05/2022  9:35 AM Ragland Bing, MD [facility]  10/12/2022  9:35 AM Calvert Cantor, CNM [facility]    Calvert Cantor, CNM

## 2022-08-09 ENCOUNTER — Ambulatory Visit: Payer: 59

## 2022-08-11 ENCOUNTER — Other Ambulatory Visit: Payer: Self-pay | Admitting: *Deleted

## 2022-08-11 ENCOUNTER — Ambulatory Visit: Payer: 59 | Admitting: *Deleted

## 2022-08-11 ENCOUNTER — Ambulatory Visit: Payer: 59 | Attending: Obstetrics and Gynecology

## 2022-08-11 VITALS — BP 112/54 | HR 92

## 2022-08-11 DIAGNOSIS — O09893 Supervision of other high risk pregnancies, third trimester: Secondary | ICD-10-CM

## 2022-08-11 DIAGNOSIS — Z3A30 30 weeks gestation of pregnancy: Secondary | ICD-10-CM | POA: Insufficient documentation

## 2022-08-11 DIAGNOSIS — O3660X1 Maternal care for excessive fetal growth, unspecified trimester, fetus 1: Secondary | ICD-10-CM

## 2022-08-11 DIAGNOSIS — Z362 Encounter for other antenatal screening follow-up: Secondary | ICD-10-CM | POA: Diagnosis not present

## 2022-08-11 DIAGNOSIS — Z3A27 27 weeks gestation of pregnancy: Secondary | ICD-10-CM | POA: Diagnosis not present

## 2022-08-11 DIAGNOSIS — O3660X0 Maternal care for excessive fetal growth, unspecified trimester, not applicable or unspecified: Secondary | ICD-10-CM

## 2022-08-11 DIAGNOSIS — O34219 Maternal care for unspecified type scar from previous cesarean delivery: Secondary | ICD-10-CM | POA: Diagnosis not present

## 2022-08-11 DIAGNOSIS — O09213 Supervision of pregnancy with history of pre-term labor, third trimester: Secondary | ICD-10-CM | POA: Diagnosis not present

## 2022-08-11 DIAGNOSIS — O09293 Supervision of pregnancy with other poor reproductive or obstetric history, third trimester: Secondary | ICD-10-CM | POA: Insufficient documentation

## 2022-08-11 DIAGNOSIS — O0990 Supervision of high risk pregnancy, unspecified, unspecified trimester: Secondary | ICD-10-CM

## 2022-08-17 ENCOUNTER — Ambulatory Visit (INDEPENDENT_AMBULATORY_CARE_PROVIDER_SITE_OTHER): Payer: 59 | Admitting: Advanced Practice Midwife

## 2022-08-17 ENCOUNTER — Encounter: Payer: Self-pay | Admitting: Advanced Practice Midwife

## 2022-08-17 VITALS — BP 106/64 | HR 92 | Wt 163.0 lb

## 2022-08-17 DIAGNOSIS — O0993 Supervision of high risk pregnancy, unspecified, third trimester: Secondary | ICD-10-CM

## 2022-08-17 DIAGNOSIS — O34219 Maternal care for unspecified type scar from previous cesarean delivery: Secondary | ICD-10-CM

## 2022-08-17 DIAGNOSIS — O0990 Supervision of high risk pregnancy, unspecified, unspecified trimester: Secondary | ICD-10-CM

## 2022-08-17 DIAGNOSIS — R4589 Other symptoms and signs involving emotional state: Secondary | ICD-10-CM

## 2022-08-17 DIAGNOSIS — Z3A31 31 weeks gestation of pregnancy: Secondary | ICD-10-CM

## 2022-08-17 NOTE — Progress Notes (Signed)
ROB [redacted]w[redacted]d.  Pt wants to discuss induction.   Notes having crying spells and being emotional pt states she is not depressed.

## 2022-08-17 NOTE — Progress Notes (Signed)
   PRENATAL VISIT NOTE  Subjective:  Caroline [name] is a 26 y.o. [phone] at [redacted]w[redacted]d being seen today for ongoing prenatal care.  She is currently monitored for the following issues for this low-risk pregnancy and has History of child affected by Down syndrome; Hypertriglyceridemia; Supervision of high risk pregnancy, antepartum; Headache disorder; Previous cesarean delivery affecting pregnancy, antepartum; and Excessive fetal growth affecting management of mother, antepartum on their problem list.  Patient reports  ongoing tearfulness and "feeling hormonal". Feelings and tears are not accompanied by depressed mood, unhappiness, SI or IPV .  Contractions: Irritability. Vag. Bleeding: None.  Movement: Present. Denies leaking of fluid.   Patient previously desired TOLAC and verbalizes concerns about the unpredictability of labor, being away from her older child, and the attendant unknowns associated with TOLAC vs repeat cesarean. She states she is "ready to be done".  The following portions of the patient's history were reviewed and updated as appropriate: allergies, current medications, past family history, past medical history, past social history, past surgical history and problem list. Problem list updated.  Objective:   Vitals:   08/17/22 0827  BP: 106/64  Pulse: 92  Weight: 163 lb (73.9 kg)    Fetal Status: Fetal Heart Rate (bpm): 154 Fundal Height: 32 cm Movement: Present     General:  Alert, oriented and cooperative. Patient is in no acute distress.  Skin: Skin is warm and dry. No rash noted.   Cardiovascular: Normal heart rate noted  Respiratory: Normal respiratory effort, no problems with respiration noted  Abdomen: Soft, gravid, appropriate for gestational age.  Pain/Pressure: Present     Pelvic: Cervical exam deferred        Extremities: Normal range of motion.  Edema: Trace  Mental Status: Normal mood and affect. Normal behavior. Normal judgment and thought content.    Assessment and Plan:  Pregnancy: [id number] at [redacted]w[redacted]d  1. Supervision of high risk pregnancy, antepartum - routine care, doing so well  2. Previous cesarean delivery affecting pregnancy, antepartum - Desires TOLAC, consent signed 07/18/2022 - Discussed with patient she can change her mind and schedule elective repeat if that will help manage stress, no rush, can be ongoing discussion as subsequent prenatal care unfolds.  3. Tearfulness - Not associated with depression or anxiety - Appreciate patient's trust in sharing her feeling with our team. We are here to support her!  4. [redacted] weeks gestation of pregnancy   Preterm labor symptoms and general obstetric precautions including but not limited to vaginal bleeding, contractions, leaking of fluid and fetal movement were reviewed in detail with the patient. Please refer to After Visit Summary for other counseling recommendations.  Return in about 2 weeks (around 08/31/2022) for Next appointment already scheduled :).  Future Appointments  Date Time Provider [place]  08/31/2022  8:35 AM Darlina Rumpf, CNM [facility]  09/14/2022  8:35 AM Darlina Rumpf, CNM [facility]  09/21/2022  8:35 AM Aletha Halim, MD [facility]  09/22/2022  8:30 AM [facility] NURSE [facility]  09/22/2022  8:45 AM [facility] US5 [facility]  09/28/2022 10:35 AM Darlina Rumpf, CNM [facility]  10/05/2022  9:35 AM Aletha Halim, MD [facility]  10/12/2022  9:35 AM Darlina Rumpf, CNM [facility]    Darlina Rumpf, CNM

## 2022-08-31 ENCOUNTER — Ambulatory Visit (INDEPENDENT_AMBULATORY_CARE_PROVIDER_SITE_OTHER): Payer: 59 | Admitting: Advanced Practice Midwife

## 2022-08-31 VITALS — BP 107/69 | HR 108 | Wt 166.0 lb

## 2022-08-31 DIAGNOSIS — O34219 Maternal care for unspecified type scar from previous cesarean delivery: Secondary | ICD-10-CM

## 2022-08-31 DIAGNOSIS — O0990 Supervision of high risk pregnancy, unspecified, unspecified trimester: Secondary | ICD-10-CM

## 2022-08-31 DIAGNOSIS — Z3A33 33 weeks gestation of pregnancy: Secondary | ICD-10-CM

## 2022-08-31 DIAGNOSIS — O3660X0 Maternal care for excessive fetal growth, unspecified trimester, not applicable or unspecified: Secondary | ICD-10-CM

## 2022-08-31 NOTE — Patient Instructions (Signed)

## 2022-08-31 NOTE — Progress Notes (Addendum)
   PRENATAL VISIT NOTE  Subjective:  Caroline [name] is a 26 y.o. [phone] at [redacted]w[redacted]d being seen today for ongoing prenatal care.  She is currently monitored for the following issues for this high-risk pregnancy and has History of child affected by Down syndrome; Hypertriglyceridemia; Supervision of high risk pregnancy, antepartum; Headache disorder; Previous cesarean delivery affecting pregnancy, antepartum; and Excessive fetal growth affecting management of mother, antepartum on their problem list.  Patient reports  "lightning crotch" . Patient reports increasing discomfort with walking, states she is "ready to be done".  Contractions: Irritability. Vag. Bleeding: None.  Movement: Present. Denies leaking of fluid.   The following portions of the patient's history were reviewed and updated as appropriate: allergies, current medications, past family history, past medical history, past social history, past surgical history and problem list. Problem list updated.  Objective:   Vitals:   08/31/22 0824  BP: 107/69  Pulse: (!) 108  Weight: 166 lb (75.3 kg)    Fetal Status: Fetal Heart Rate (bpm): 153 Fundal Height: 34 cm Movement: Present     General:  Alert, oriented and cooperative. Patient is in no acute distress.  Skin: Skin is warm and dry. No rash noted.   Cardiovascular: Normal heart rate noted  Respiratory: Normal respiratory effort, no problems with respiration noted  Abdomen: Soft, gravid, appropriate for gestational age.  Pain/Pressure: Present     Pelvic: Cervical exam deferred        Extremities: Normal range of motion.  Edema: Trace  Mental Status: Normal mood and affect. Normal behavior. Normal judgment and thought content.   Assessment and Plan:  Pregnancy: [id number] at [redacted]w[redacted]d  1. Supervision of high risk pregnancy, antepartum - Routine care - Preemptive teaching GBS swab at 36 weeks - Declined Flu shot today  2. Previous cesarean delivery affecting pregnancy,  antepartum - For TOLAC, consent signed 07/18/2022  3. Excessive fetal growth affecting management of pregnancy, antepartum, single or unspecified fetus - Serial growth scans with MFM - EFW 1866g/86% on 08/11/2022 at 30w 3d  4. [redacted] weeks gestation of pregnancy   Preterm labor symptoms and general obstetric precautions including but not limited to vaginal bleeding, contractions, leaking of fluid and fetal movement were reviewed in detail with the patient. Please refer to After Visit Summary for other counseling recommendations.  Return in about 2 weeks (around 09/14/2022) for MD or APP.  Future Appointments  Date Time Provider [place]  09/14/2022  8:35 AM Darlina Rumpf, CNM [facility]  09/21/2022  8:35 AM Aletha Halim, MD [facility]  09/22/2022  8:30 AM [facility] NURSE [facility]  09/22/2022  8:45 AM [facility] US5 [facility]  09/28/2022 10:35 AM Darlina Rumpf, CNM [facility]  10/05/2022  9:35 AM Aletha Halim, MD [facility]  10/12/2022  9:35 AM Darlina Rumpf, CNM [facility]    Darlina Rumpf, CNM

## 2022-09-05 ENCOUNTER — Other Ambulatory Visit: Payer: Self-pay | Admitting: Family Medicine

## 2022-09-14 ENCOUNTER — Encounter: Payer: Self-pay | Admitting: Advanced Practice Midwife

## 2022-09-14 ENCOUNTER — Ambulatory Visit (INDEPENDENT_AMBULATORY_CARE_PROVIDER_SITE_OTHER): Payer: 59 | Admitting: Advanced Practice Midwife

## 2022-09-14 VITALS — BP 104/69 | HR 116 | Wt 168.0 lb

## 2022-09-14 DIAGNOSIS — O0993 Supervision of high risk pregnancy, unspecified, third trimester: Secondary | ICD-10-CM

## 2022-09-14 DIAGNOSIS — Z3A35 35 weeks gestation of pregnancy: Secondary | ICD-10-CM

## 2022-09-14 DIAGNOSIS — O34219 Maternal care for unspecified type scar from previous cesarean delivery: Secondary | ICD-10-CM

## 2022-09-14 DIAGNOSIS — O0990 Supervision of high risk pregnancy, unspecified, unspecified trimester: Secondary | ICD-10-CM

## 2022-09-14 DIAGNOSIS — O3660X0 Maternal care for excessive fetal growth, unspecified trimester, not applicable or unspecified: Secondary | ICD-10-CM

## 2022-09-14 NOTE — Progress Notes (Signed)
   PRENATAL VISIT NOTE  Subjective:  Caroline [name] is a 26 y.o. [phone] at [redacted]w[redacted]d being seen today for ongoing prenatal care.  She is currently monitored for the following issues for this high-risk pregnancy and has History of child affected by Down syndrome; Hypertriglyceridemia; Supervision of high risk pregnancy, antepartum; Headache disorder; Previous cesarean delivery affecting pregnancy, antepartum; and Excessive fetal growth affecting management of mother, antepartum on their problem list.  Patient reports  recurrent painful contractions, up to 3 per hour. They resolved quickly especially if she rests .  Contractions: Irritability. Vag. Bleeding: None.  Movement: Present. Denies leaking of fluid.   The following portions of the patient's history were reviewed and updated as appropriate: allergies, current medications, past family history, past medical history, past social history, past surgical history and problem list. Problem list updated.  Objective:   Vitals:   09/14/22 0836  BP: 104/69  Pulse: (!) 116  Weight: 168 lb (76.2 kg)    Fetal Status: Fetal Heart Rate (bpm): 156  Fundal Height: 36 cm Movement: Present  Presentation: Vertex  General:  Alert, oriented and cooperative. Patient is in no acute distress.  Skin: Skin is warm and dry. No rash noted.   Cardiovascular: Normal heart rate noted  Respiratory: Normal respiratory effort, no problems with respiration noted  Abdomen: Soft, gravid, appropriate for gestational age.  Pain/Pressure: Present     Pelvic: Cervical exam deferred        Extremities: Normal range of motion.  Edema: Trace  Mental Status: Normal mood and affect. Normal behavior. Normal judgment and thought content.   Assessment and Plan:  Pregnancy: [id number] at [redacted]w[redacted]d  1. Supervision of high risk pregnancy, antepartum - Routine care - Reviewed preterm labor precautions. Currently 3 painful contractions per hour, come to MAU right away please for preterm  labor evaluation if 6 per hour - Preemptive teaching, GBS next visit  2. Excessive fetal growth affecting management of pregnancy, antepartum, single or unspecified fetus - 86% (1866g) on 08/11/2022 at 30+3 - Repeat imaging scheduled for 10/27  3. Previous cesarean delivery affecting pregnancy, antepartum - For TOLAC, consent signed 07/18/2022  4. [redacted] weeks gestation of pregnancy   Preterm labor symptoms and general obstetric precautions including but not limited to vaginal bleeding, contractions, leaking of fluid and fetal movement were reviewed in detail with the patient. Please refer to After Visit Summary for other counseling recommendations.  Return in about 1 week (around 09/21/2022) for MD or APP, GBS next visit.  Future Appointments  Date Time Provider [place]  09/21/2022  8:35 AM Aletha Halim, MD [facility]  09/22/2022  8:30 AM [facility] NURSE [facility]  09/22/2022  8:45 AM [facility] US5 [facility]  09/28/2022 10:35 AM Darlina Rumpf, CNM [facility]  10/05/2022  9:35 AM Aletha Halim, MD [facility]  10/12/2022  9:35 AM Darlina Rumpf, CNM [facility]    Darlina Rumpf, CNM

## 2022-09-21 ENCOUNTER — Other Ambulatory Visit (HOSPITAL_COMMUNITY)
Admission: RE | Admit: 2022-09-21 | Discharge: 2022-09-21 | Disposition: A | Discharge status: Home / Self Care | Payer: 59 | Source: Ambulatory Visit | Attending: Obstetrics and Gynecology | Admitting: Obstetrics and Gynecology

## 2022-09-21 ENCOUNTER — Ambulatory Visit (INDEPENDENT_AMBULATORY_CARE_PROVIDER_SITE_OTHER): Payer: 59 | Admitting: Obstetrics and Gynecology

## 2022-09-21 VITALS — BP 99/64 | HR 82 | Wt 167.0 lb

## 2022-09-21 DIAGNOSIS — O3663X0 Maternal care for excessive fetal growth, third trimester, not applicable or unspecified: Secondary | ICD-10-CM

## 2022-09-21 DIAGNOSIS — Z3493 Encounter for supervision of normal pregnancy, unspecified, third trimester: Secondary | ICD-10-CM | POA: Diagnosis not present

## 2022-09-21 DIAGNOSIS — Z3A36 36 weeks gestation of pregnancy: Secondary | ICD-10-CM | POA: Diagnosis not present

## 2022-09-21 DIAGNOSIS — O0993 Supervision of high risk pregnancy, unspecified, third trimester: Secondary | ICD-10-CM

## 2022-09-21 DIAGNOSIS — O34219 Maternal care for unspecified type scar from previous cesarean delivery: Secondary | ICD-10-CM

## 2022-09-21 DIAGNOSIS — O0990 Supervision of high risk pregnancy, unspecified, unspecified trimester: Secondary | ICD-10-CM

## 2022-09-21 DIAGNOSIS — O3660X0 Maternal care for excessive fetal growth, unspecified trimester, not applicable or unspecified: Secondary | ICD-10-CM

## 2022-09-21 NOTE — Progress Notes (Signed)
ROB [redacted]w[redacted]d  CC: None   Pt would like cervix check.

## 2022-09-21 NOTE — Progress Notes (Signed)
   PRENATAL VISIT NOTE  Subjective:  Caroline [name] is a 26 y.o. [phone] at [redacted]w[redacted]d being seen today for ongoing prenatal care.  She is currently monitored for the following issues for this low-risk pregnancy and has History of child affected by Down syndrome; Hypertriglyceridemia; Supervision of high risk pregnancy, antepartum; Headache disorder; Previous cesarean delivery affecting pregnancy, antepartum; and Excessive fetal growth affecting management of mother, antepartum on their problem list.  Patient reports occasional contractions.  Contractions: Irritability. Vag. Bleeding: None.  Movement: Present. Denies leaking of fluid.   The following portions of the patient's history were reviewed and updated as appropriate: allergies, current medications, past family history, past medical history, past social history, past surgical history and problem list.   Objective:   Vitals:   09/21/22 0823  BP: 99/64  Pulse: 82  Weight: 167 lb (75.8 kg)    Fetal Status: Fetal Heart Rate (bpm): 164 Fundal Height: 36 cm Movement: Present  Presentation: Vertex  General:  Alert, oriented and cooperative. Patient is in no acute distress.  Skin: Skin is warm and dry. No rash noted.   Cardiovascular: Normal heart rate noted  Respiratory: Normal respiratory effort, no problems with respiration noted  Abdomen: Soft, gravid, appropriate for gestational age.  Pain/Pressure: Present     Pelvic: Cervical exam performed in the presence of a chaperone Dilation: Fingertip Effacement (%): 50 Station: -3  Extremities: Normal range of motion.  Edema: Trace  Mental Status: Normal mood and affect. Normal behavior. Normal judgment and thought content.   Assessment and Plan:  Pregnancy: [id number] at [redacted]w[redacted]d 1. [redacted] weeks gestation of pregnancy Routine care. D/w pt more re: any birth control next visit - Strep Gp B NAA - Cervicovaginal ancillary only  2. Previous cesarean delivery affecting pregnancy, antepartum PLTCS  for previa, VB. Desires tolac; form signed 8/22  3. Supervision of high risk pregnancy, antepartum  4. Excessive fetal growth affecting management of pregnancy, antepartum, single or unspecified fetus Borderline LGA: 9/15, 86%, 1866gm, ac 94%, afi 19; GTT wnl F/u rpt scan tomorrow  Preterm labor symptoms and general obstetric precautions including but not limited to vaginal bleeding, contractions, leaking of fluid and fetal movement were reviewed in detail with the patient. Please refer to After Visit Summary for other counseling recommendations.   Return in about 1 week (around 09/28/2022) for in person, low risk ob, md or app.  Future Appointments  Date Time Provider [place]  09/22/2022  8:30 AM [facility] NURSE [facility]  09/22/2022  8:45 AM [facility] US5 [facility]  09/28/2022 10:35 AM Darlina Rumpf, CNM [facility]  10/05/2022  9:35 AM Aletha Halim, MD [facility]  10/12/2022  9:35 AM Darlina Rumpf, CNM [facility]    Aletha Halim, MD

## 2022-09-22 ENCOUNTER — Ambulatory Visit: Payer: 59 | Admitting: *Deleted

## 2022-09-22 ENCOUNTER — Ambulatory Visit: Payer: 59 | Attending: Obstetrics and Gynecology

## 2022-09-22 VITALS — BP 106/62 | HR 94

## 2022-09-22 DIAGNOSIS — O34219 Maternal care for unspecified type scar from previous cesarean delivery: Secondary | ICD-10-CM | POA: Diagnosis not present

## 2022-09-22 DIAGNOSIS — O09893 Supervision of other high risk pregnancies, third trimester: Secondary | ICD-10-CM | POA: Diagnosis not present

## 2022-09-22 DIAGNOSIS — O0990 Supervision of high risk pregnancy, unspecified, unspecified trimester: Secondary | ICD-10-CM

## 2022-09-22 DIAGNOSIS — Z3A36 36 weeks gestation of pregnancy: Secondary | ICD-10-CM | POA: Diagnosis not present

## 2022-09-22 DIAGNOSIS — O09213 Supervision of pregnancy with history of pre-term labor, third trimester: Secondary | ICD-10-CM | POA: Diagnosis not present

## 2022-09-22 DIAGNOSIS — O09293 Supervision of pregnancy with other poor reproductive or obstetric history, third trimester: Secondary | ICD-10-CM | POA: Diagnosis not present

## 2022-09-22 DIAGNOSIS — O3660X0 Maternal care for excessive fetal growth, unspecified trimester, not applicable or unspecified: Secondary | ICD-10-CM | POA: Insufficient documentation

## 2022-09-22 LAB — CERVICOVAGINAL ANCILLARY ONLY
Chlamydia: NEGATIVE
Comment: NEGATIVE
Comment: NORMAL
Neisseria Gonorrhea: NEGATIVE

## 2022-09-23 LAB — STREP GP B NAA: Strep Gp B NAA: NEGATIVE

## 2022-09-28 ENCOUNTER — Ambulatory Visit (INDEPENDENT_AMBULATORY_CARE_PROVIDER_SITE_OTHER): Payer: 59 | Admitting: Advanced Practice Midwife

## 2022-09-28 VITALS — BP 113/74 | HR 86 | Wt 170.0 lb

## 2022-09-28 DIAGNOSIS — O3660X0 Maternal care for excessive fetal growth, unspecified trimester, not applicable or unspecified: Secondary | ICD-10-CM

## 2022-09-28 DIAGNOSIS — Z3A37 37 weeks gestation of pregnancy: Secondary | ICD-10-CM

## 2022-09-28 DIAGNOSIS — R0981 Nasal congestion: Secondary | ICD-10-CM

## 2022-09-28 DIAGNOSIS — O3663X0 Maternal care for excessive fetal growth, third trimester, not applicable or unspecified: Secondary | ICD-10-CM

## 2022-09-28 DIAGNOSIS — O0990 Supervision of high risk pregnancy, unspecified, unspecified trimester: Secondary | ICD-10-CM

## 2022-09-28 DIAGNOSIS — O0993 Supervision of high risk pregnancy, unspecified, third trimester: Secondary | ICD-10-CM

## 2022-09-28 NOTE — Progress Notes (Signed)
ROB [redacted]w[redacted]d  CC: Pain pressure. Pt has cold taking mucinex.    Pt would like cervix check

## 2022-09-28 NOTE — Progress Notes (Signed)
   PRENATAL VISIT NOTE  Subjective:  Caroline [name] is a 26 y.o. [phone] at [redacted]w[redacted]d being seen today for ongoing prenatal care.  She is currently monitored for the following issues for this high-risk pregnancy and has History of child affected by Down syndrome; Hypertriglyceridemia; Supervision of high risk pregnancy, antepartum; Headache disorder; Previous cesarean delivery affecting pregnancy, antepartum; and Excessive fetal growth affecting management of mother, antepartum on their problem list.  Patient reports  head cold with sinus congestion, Braxton Hicks contractions, generally feeling uncomfortable and "ready to be done" .  Contractions: Irritability. Vag. Bleeding: None.  Movement: Present. Denies leaking of fluid.   The following portions of the patient's history were reviewed and updated as appropriate: allergies, current medications, past family history, past medical history, past social history, past surgical history and problem list. Problem list updated.  Objective:   Vitals:   09/28/22 1047  BP: 113/74  Pulse: 86  Weight: 170 lb (77.1 kg)    Fetal Status: Fetal Heart Rate (bpm): 152   Movement: Present  Presentation: Vertex  General:  Alert, oriented and cooperative. Patient is in no acute distress.  Skin: Skin is warm and dry. No rash noted.   Cardiovascular: Normal heart rate noted  Respiratory: Normal respiratory effort, no problems with respiration noted  Abdomen: Soft, gravid, appropriate for gestational age.  Pain/Pressure: Present     Pelvic: Cervical exam performed Dilation: 1 Effacement (%): 60 Station: 0  Extremities: Normal range of motion.  Edema: Trace  Mental Status: Normal mood and affect. Normal behavior. Normal judgment and thought content.   Assessment and Plan:  Pregnancy: [id number] at [redacted]w[redacted]d  1. Supervision of high risk pregnancy, antepartum - routine care - CBC; Standing - Type and screen; Standing - RPR; Standing  2. Excessive fetal growth  affecting management of pregnancy, antepartum, single or unspecified fetus - > 99% on 09/22/2022 at 36+3 GA - IOL scheduled at midnight on 11/15 at 39+1 - For TOLAC, 1cm today, plan for foley bulb + low dose Pit while foley is in place  3. Sinus congestion - continue OTC Mucinex  4. [redacted] weeks gestation of pregnancy   Term labor symptoms and general obstetric precautions including but not limited to vaginal bleeding, contractions, leaking of fluid and fetal movement were reviewed in detail with the patient. Please refer to After Visit Summary for other counseling recommendations.  Return in about 1 week (around 10/05/2022) for MD or APP.  Future Appointments  Date Time Provider [place]  10/05/2022  9:35 AM Aletha Halim, MD [facility]  10/11/2022 12:00 AM [facility] SCHED ROOM [facility] None  10/12/2022  9:35 AM Darlina Rumpf, CNM [facility] [place], CNM

## 2022-10-04 ENCOUNTER — Other Ambulatory Visit: Payer: Self-pay | Admitting: Advanced Practice Midwife

## 2022-10-05 ENCOUNTER — Telehealth: Payer: Self-pay | Admitting: Lactation Services

## 2022-10-05 ENCOUNTER — Encounter (HOSPITAL_COMMUNITY): Payer: Self-pay | Admitting: *Deleted

## 2022-10-05 ENCOUNTER — Ambulatory Visit (INDEPENDENT_AMBULATORY_CARE_PROVIDER_SITE_OTHER): Payer: 59 | Admitting: Obstetrics and Gynecology

## 2022-10-05 ENCOUNTER — Encounter: Payer: Self-pay | Admitting: Obstetrics and Gynecology

## 2022-10-05 ENCOUNTER — Telehealth (HOSPITAL_COMMUNITY): Payer: Self-pay | Admitting: *Deleted

## 2022-10-05 VITALS — BP 117/76 | HR 76 | Wt 173.0 lb

## 2022-10-05 DIAGNOSIS — O3660X0 Maternal care for excessive fetal growth, unspecified trimester, not applicable or unspecified: Secondary | ICD-10-CM

## 2022-10-05 DIAGNOSIS — Z3A38 38 weeks gestation of pregnancy: Secondary | ICD-10-CM

## 2022-10-05 DIAGNOSIS — O3663X0 Maternal care for excessive fetal growth, third trimester, not applicable or unspecified: Secondary | ICD-10-CM

## 2022-10-05 DIAGNOSIS — O34219 Maternal care for unspecified type scar from previous cesarean delivery: Secondary | ICD-10-CM

## 2022-10-05 NOTE — Progress Notes (Signed)
ROB [redacted]w[redacted]d  Pt wants cervix check.

## 2022-10-05 NOTE — Progress Notes (Addendum)
   PRENATAL VISIT NOTE  Subjective:  Caroline [name] is a 26 y.o. [phone] at [redacted]w[redacted]d being seen today for ongoing prenatal care.  She is currently monitored for the following issues for this low-risk pregnancy and has History of child affected by Down syndrome; Hypertriglyceridemia; Supervision of high risk pregnancy, antepartum; Headache disorder; Previous cesarean delivery affecting pregnancy, antepartum; and Excessive fetal growth affecting management of mother, antepartum on their problem list.  Patient reports occasional contractions.  Contractions: Irritability. Vag. Bleeding: None.  Movement: Present. Denies leaking of fluid.   The following portions of the patient's history were reviewed and updated as appropriate: allergies, current medications, past family history, past medical history, past social history, past surgical history and problem list.   Objective:   Vitals:   10/05/22 1000  BP: 117/76  Pulse: 76  Weight: 173 lb (78.5 kg)    Fetal Status: Fetal Heart Rate (bpm): 152   Movement: Present  Presentation: Vertex  General:  Alert, oriented and cooperative. Patient is in no acute distress.  Skin: Skin is warm and dry. No rash noted.   Cardiovascular: Normal heart rate noted  Respiratory: Normal respiratory effort, no problems with respiration noted  Abdomen: Soft, gravid, appropriate for gestational age.  Pain/Pressure: Present     Pelvic: Cervical exam performed in the presence of a chaperone Dilation: 1 Effacement (%): 50 Station: -1  Extremities: Normal range of motion.  Edema: Mild pitting, slight indentation  Mental Status: Normal mood and affect. Normal behavior. Normal judgment and thought content.   Assessment and Plan:  Pregnancy: G3P0111 at [redacted]w[redacted]d 1. [redacted] weeks gestation of pregnancy GBS neg  2. Excessive fetal growth affecting management of pregnancy, antepartum, single or unspecified fetus For IOL on 11/14 at 2330 GTT wnl  3. Previous cesarean delivery  affecting pregnancy, antepartum Tolac consent already signed  Term labor symptoms and general obstetric precautions including but not limited to vaginal bleeding, contractions, leaking of fluid and fetal movement were reviewed in detail with the patient. Please refer to After Visit Summary for other counseling recommendations.   No follow-ups on file.  Future Appointments  Date Time Provider Department Center  10/11/2022 12:00 AM [facility] SCHED ROOM [facility] None    [place] Bing, MD

## 2022-10-05 NOTE — Telephone Encounter (Signed)
Patient called and LM for Lactation. She reports she is trying to collect colostrum prior to her induction on Tuiesday 11/14 and is having difficulty.   Returned patients call. This is mom's 2nd baby, her ist baby was 2 months early and she reports she did not produce milk with her. She reports that she was not taught hand expression.   Reviewed it is OK to hand express at this stage of pregnancy and can do so 3-4 times a day if she wants.   She reports she was rolling toward the nipple and causing a lot of pain. Reviewed how to hand express over the phone and directed to look up Hand Expression video from [facility].   Patient will call back with any questions or concerns as needed.

## 2022-10-05 NOTE — Telephone Encounter (Signed)
Preadmission screen  

## 2022-10-11 ENCOUNTER — Inpatient Hospital Stay (HOSPITAL_COMMUNITY): Payer: 59 | Admitting: Anesthesiology

## 2022-10-11 ENCOUNTER — Inpatient Hospital Stay (HOSPITAL_COMMUNITY)
Admission: RE | Admit: 2022-10-11 | Discharge: 2022-10-12 | DRG: 807 | Disposition: A | Discharge status: Home / Self Care | Payer: 59 | Attending: Family Medicine | Admitting: Family Medicine

## 2022-10-11 ENCOUNTER — Other Ambulatory Visit: Payer: Self-pay | Admitting: Obstetrics and Gynecology

## 2022-10-11 ENCOUNTER — Encounter (HOSPITAL_COMMUNITY): Payer: Self-pay | Admitting: Family Medicine

## 2022-10-11 ENCOUNTER — Other Ambulatory Visit: Payer: Self-pay

## 2022-10-11 ENCOUNTER — Inpatient Hospital Stay (HOSPITAL_COMMUNITY): Payer: 59

## 2022-10-11 DIAGNOSIS — O9902 Anemia complicating childbirth: Secondary | ICD-10-CM | POA: Diagnosis not present

## 2022-10-11 DIAGNOSIS — O34219 Maternal care for unspecified type scar from previous cesarean delivery: Secondary | ICD-10-CM | POA: Diagnosis present

## 2022-10-11 DIAGNOSIS — R519 Headache, unspecified: Secondary | ICD-10-CM | POA: Diagnosis present

## 2022-10-11 DIAGNOSIS — O24429 Gestational diabetes mellitus in childbirth, unspecified control: Secondary | ICD-10-CM | POA: Diagnosis not present

## 2022-10-11 DIAGNOSIS — Z3A39 39 weeks gestation of pregnancy: Secondary | ICD-10-CM

## 2022-10-11 DIAGNOSIS — O0990 Supervision of high risk pregnancy, unspecified, unspecified trimester: Secondary | ICD-10-CM

## 2022-10-11 DIAGNOSIS — Q909 Down syndrome, unspecified: Secondary | ICD-10-CM

## 2022-10-11 DIAGNOSIS — O3663X1 Maternal care for excessive fetal growth, third trimester, fetus 1: Secondary | ICD-10-CM | POA: Diagnosis not present

## 2022-10-11 DIAGNOSIS — O3663X0 Maternal care for excessive fetal growth, third trimester, not applicable or unspecified: Secondary | ICD-10-CM | POA: Diagnosis not present

## 2022-10-11 DIAGNOSIS — O34211 Maternal care for low transverse scar from previous cesarean delivery: Secondary | ICD-10-CM | POA: Diagnosis not present

## 2022-10-11 DIAGNOSIS — O3660X0 Maternal care for excessive fetal growth, unspecified trimester, not applicable or unspecified: Secondary | ICD-10-CM | POA: Diagnosis present

## 2022-10-11 DIAGNOSIS — O3663X9 Maternal care for excessive fetal growth, third trimester, other fetus: Secondary | ICD-10-CM | POA: Diagnosis present

## 2022-10-11 LAB — CBC
HCT: 33.2 % — ABNORMAL LOW (ref 36.0–46.0)
Hemoglobin: 10.8 g/dL — ABNORMAL LOW (ref 12.0–15.0)
MCH: 26.5 pg (ref 26.0–34.0)
MCHC: 32.5 g/dL (ref 30.0–36.0)
MCV: 81.4 fL (ref 80.0–100.0)
Platelets: 233 10*3/uL (ref 150–400)
RBC: 4.08 MIL/uL (ref 3.87–5.11)
RDW: 14.5 % (ref 11.5–15.5)
WBC: 8.4 10*3/uL (ref 4.0–10.5)
nRBC: 0 % (ref 0.0–0.2)

## 2022-10-11 LAB — RPR: RPR Ser Ql: NONREACTIVE

## 2022-10-11 LAB — TYPE AND SCREEN
ABO/RH(D): A POS
Antibody Screen: NEGATIVE

## 2022-10-11 MED ORDER — DIPHENHYDRAMINE HCL 25 MG PO CAPS: 25.0000 mg | ORAL_CAPSULE | Freq: Four times a day (QID) | ORAL | Status: DC | PRN

## 2022-10-11 MED ORDER — EPHEDRINE 5 MG/ML INJ: 10.0000 mg | INTRAVENOUS | Status: DC | PRN

## 2022-10-11 MED ORDER — LACTATED RINGERS IV SOLN: 500.0000 mL | INTRAVENOUS | Status: DC | PRN

## 2022-10-11 MED ORDER — SOD CITRATE-CITRIC ACID 500-334 MG/5ML PO SOLN: 30.0000 mL | ORAL | Status: DC | PRN

## 2022-10-11 MED ORDER — BENZOCAINE-MENTHOL 20-0.5 % EX AERO: 1.0000 | INHALATION_SPRAY | CUTANEOUS | Status: DC | PRN

## 2022-10-11 MED ORDER — ACETAMINOPHEN 325 MG PO TABS
650.0000 mg | ORAL_TABLET | ORAL | Status: DC | PRN
  Administered 2022-10-12: 650 mg via ORAL
  Filled 2022-10-11: qty 2

## 2022-10-11 MED ORDER — LIDOCAINE HCL (PF) 1 % IJ SOLN
INTRAMUSCULAR | Status: DC | PRN
  Administered 2022-10-11: 10 mL via EPIDURAL

## 2022-10-11 MED ORDER — PHENYLEPHRINE 80 MCG/ML (10ML) SYRINGE FOR IV PUSH (FOR BLOOD PRESSURE SUPPORT): 80.0000 ug | PREFILLED_SYRINGE | INTRAVENOUS | Status: DC | PRN

## 2022-10-11 MED ORDER — PRENATAL MULTIVITAMIN CH
1.0000 | ORAL_TABLET | Freq: Every day | ORAL | Status: DC
  Administered 2022-10-12: 1 via ORAL
  Filled 2022-10-11: qty 1

## 2022-10-11 MED ORDER — OXYTOCIN-SODIUM CHLORIDE 30-0.9 UT/500ML-% IV SOLN
1.0000 m[IU]/min | INTRAVENOUS | Status: DC
  Administered 2022-10-11: 2 m[IU]/min via INTRAVENOUS
  Filled 2022-10-11: qty 500

## 2022-10-11 MED ORDER — IBUPROFEN 600 MG PO TABS
600.0000 mg | ORAL_TABLET | Freq: Four times a day (QID) | ORAL | Status: DC
  Administered 2022-10-12 (×3): 600 mg via ORAL
  Filled 2022-10-11 (×3): qty 1

## 2022-10-11 MED ORDER — SIMETHICONE 80 MG PO CHEW: 80.0000 mg | CHEWABLE_TABLET | ORAL | Status: DC | PRN

## 2022-10-11 MED ORDER — COCONUT OIL OIL: 1.0000 | TOPICAL_OIL | Status: DC | PRN

## 2022-10-11 MED ORDER — SODIUM CHLORIDE 0.9% FLUSH: 3.0000 mL | INTRAVENOUS | Status: DC | PRN

## 2022-10-11 MED ORDER — FENTANYL CITRATE (PF) 100 MCG/2ML IJ SOLN
INTRAMUSCULAR | Status: AC
  Administered 2022-10-11: 100 ug
  Filled 2022-10-11: qty 2

## 2022-10-11 MED ORDER — ZOLPIDEM TARTRATE 5 MG PO TABS: 5.0000 mg | ORAL_TABLET | Freq: Every evening | ORAL | Status: DC | PRN

## 2022-10-11 MED ORDER — FENTANYL-BUPIVACAINE-NACL 0.5-0.125-0.9 MG/250ML-% EP SOLN
12.0000 mL/h | EPIDURAL | Status: DC | PRN
  Administered 2022-10-11: 12 mL/h via EPIDURAL
  Filled 2022-10-11: qty 250

## 2022-10-11 MED ORDER — FENTANYL CITRATE (PF) 100 MCG/2ML IJ SOLN
100.0000 ug | INTRAMUSCULAR | Status: DC | PRN
  Administered 2022-10-11: 100 ug via INTRAVENOUS
  Filled 2022-10-11: qty 2

## 2022-10-11 MED ORDER — LIDOCAINE HCL (PF) 1 % IJ SOLN: 30.0000 mL | INTRAMUSCULAR | Status: DC | PRN

## 2022-10-11 MED ORDER — TERBUTALINE SULFATE 1 MG/ML IJ SOLN: 0.2500 mg | Freq: Once | INTRAMUSCULAR | Status: DC | PRN

## 2022-10-11 MED ORDER — SODIUM CHLORIDE 0.9 % IV SOLN: 250.0000 mL | INTRAVENOUS | Status: DC | PRN

## 2022-10-11 MED ORDER — OXYTOCIN BOLUS FROM INFUSION
333.0000 mL | Freq: Once | INTRAVENOUS | Status: AC
  Administered 2022-10-11: 333 mL via INTRAVENOUS

## 2022-10-11 MED ORDER — ONDANSETRON HCL 4 MG/2ML IJ SOLN: 4.0000 mg | INTRAMUSCULAR | Status: DC | PRN

## 2022-10-11 MED ORDER — OXYCODONE HCL 5 MG PO TABS: 5.0000 mg | ORAL_TABLET | ORAL | Status: DC | PRN

## 2022-10-11 MED ORDER — TETANUS-DIPHTH-ACELL PERTUSSIS 5-2.5-18.5 LF-MCG/0.5 IM SUSY: 0.5000 mL | PREFILLED_SYRINGE | Freq: Once | INTRAMUSCULAR | Status: DC

## 2022-10-11 MED ORDER — LACTATED RINGERS IV SOLN: INTRAVENOUS | Status: DC

## 2022-10-11 MED ORDER — WITCH HAZEL-GLYCERIN EX PADS: 1.0000 | MEDICATED_PAD | CUTANEOUS | Status: DC | PRN

## 2022-10-11 MED ORDER — ONDANSETRON HCL 4 MG PO TABS: 4.0000 mg | ORAL_TABLET | ORAL | Status: DC | PRN

## 2022-10-11 MED ORDER — OXYTOCIN-SODIUM CHLORIDE 30-0.9 UT/500ML-% IV SOLN
2.5000 [IU]/h | INTRAVENOUS | Status: DC
  Administered 2022-10-11: 2.5 [IU]/h via INTRAVENOUS

## 2022-10-11 MED ORDER — SODIUM CHLORIDE 0.9% FLUSH: 3.0000 mL | Freq: Two times a day (BID) | INTRAVENOUS | Status: DC

## 2022-10-11 MED ORDER — DIPHENHYDRAMINE HCL 50 MG/ML IJ SOLN: 12.5000 mg | INTRAMUSCULAR | Status: DC | PRN

## 2022-10-11 MED ORDER — DIBUCAINE (PERIANAL) 1 % EX OINT: 1.0000 | TOPICAL_OINTMENT | CUTANEOUS | Status: DC | PRN

## 2022-10-11 MED ORDER — ONDANSETRON HCL 4 MG/2ML IJ SOLN
4.0000 mg | Freq: Four times a day (QID) | INTRAMUSCULAR | Status: DC | PRN
  Administered 2022-10-11: 4 mg via INTRAVENOUS
  Filled 2022-10-11: qty 2

## 2022-10-11 MED ORDER — DOCUSATE SODIUM 100 MG PO CAPS
100.0000 mg | ORAL_CAPSULE | Freq: Two times a day (BID) | ORAL | Status: DC
  Administered 2022-10-12: 100 mg via ORAL
  Filled 2022-10-11: qty 1

## 2022-10-11 MED ORDER — LACTATED RINGERS IV SOLN: 500.0000 mL | Freq: Once | INTRAVENOUS | Status: DC

## 2022-10-11 MED ORDER — SENNOSIDES-DOCUSATE SODIUM 8.6-50 MG PO TABS: 2.0000 | ORAL_TABLET | ORAL | Status: DC

## 2022-10-11 NOTE — H&P (Signed)
OBSTETRIC ADMISSION HISTORY AND PHYSICAL  Caroline [name] is a 26 y.o. female [phone] with IUP at [redacted]w[redacted]d by LMP presenting for scheduled IOL due to [place] baby. She has had a previous C-section for placenta previa and is planning for a TOLAC. She reports +FMs, No LOF, no VB, no blurry vision, headaches or peripheral edema, and RUQ pain.  She plans on breast feeding. She is undecided about birth control. She received her prenatal care at [facility]  Dating: By LMP and 7 wk [place] --->  Estimated Date of Delivery: 10/17/22  Sono:    @[redacted]w[redacted]d , CWD, normal anatomy, cephalic presentation, [id number], 4100% EFW   Prenatal History/Complications:  History of previous C-section Large for gestational age baby   Nursing Staff Provider  Office Location  [place] Dating  LMP + 7 wk [place]  [facility] Model [ X] Traditional [ ]  Centering [ ]  Mom-Baby Dyad    Language  english Anatomy US  WNL  Flu Vaccine   Genetic/Carrier Screen  NIPS:LR female    AFP:  negative  Horizon: Neg x 4  TDaP Vaccine   Declined 07/18/22 Hgb A1C or  GTT Early 5.2 Third trimester   COVID Vaccine No   LAB RESULTS   Rhogam   Blood Type A/Positive/-- (05/03 1046)   Baby Feeding Plan Breast Antibody Negative (05/03 1046)  Contraception Undecided Rubella 3.03 (05/03 1046)  Circumcision GIRL  RPR Non Reactive (08/22 0950)   Pediatrician   HBsAg Negative (05/03 1046)   Support Person FOB HCVAb Negative  Prenatal Classes  HIV Non Reactive (08/22 0950)     BTL Consent  GBS Negative/-- (10/26 0849)neg  VBAC Consent  Pap WNL 2021       DME Rx [ ]  BP cuff [ ]  Weight Scale Waterbirth  [ ]  Class [ ]  Consent [ ]  CNM visit  PHQ9 & GAD7 [  ] new OB Valu.Nieves  ] 28 weeks  [  ] 36 weeks Induction  [ ]  Orders Entered [ ] Foley Y/N    Past Medical History: Past Medical History:  Diagnosis Date   Gestational diabetes    Headache    History of child affected by Down syndrome    Parvovirus exposure 03/26/2017   Dog sick with Parvovirus around [redacted] weeks GA Negative  initial and f/u testing.    Placenta previa affecting delivery    c-section    Past Surgical History: Past Surgical History:  Procedure Laterality Date   CESAREAN SECTION N/A 08/11/2017   Procedure: CESAREAN SECTION;  Surgeon: Aletha Halim, MD;  Location: [facility];  Service: Obstetrics;  Laterality: N/A;   WISDOM TOOTH EXTRACTION Bilateral     Obstetrical History: OB History     Gravida  3   Para  1   Term  0   Preterm  1   AB  1   Living  1      SAB  1   IAB  0   Ectopic  0   Multiple  0   Live Births  1           Social History Social History   Socioeconomic History   Marital status: Married    Spouse name: Not on file   Number of children: Not on file   Years of education: Not on file   Highest education level: Not on file  Occupational History   Not on file  Tobacco Use   Smoking status: Never    Passive  exposure: Past   Smokeless tobacco: Never  Vaping Use   Vaping Use: Never used  Substance and Sexual Activity   Alcohol use: Not Currently    Comment: not while preg   Drug use: No   Sexual activity: Yes    Partners: Male    Birth control/protection: None  Other Topics Concern   Not on file  Social History Narrative   Not on file   Social Determinants of Health   Financial Resource Strain: Not on file  Food Insecurity: No Food Insecurity (10/11/2022)   Hunger Vital Sign    Worried About Running Out of Food in the Last Year: Never true    Ran Out of Food in the Last Year: Never true  Transportation Needs: No Transportation Needs (10/11/2022)   PRAPARE - Hydrologist (Medical): No    Lack of Transportation (Non-Medical): No  Physical Activity: Not on file  Stress: Not on file  Social Connections: Not on file    Family History: Family History  Problem Relation Age of Onset   Kidney failure Mother    Drug abuse Mother    Heart disease Father    Drug abuse Maternal Grandmother     Heart disease Paternal Grandfather    Cancer Paternal Grandfather    Hypertension Paternal Grandfather    Lung cancer Paternal Grandfather    Diabetes Paternal Grandfather    Heart disease Other    Asthma Neg Hx    Birth defects Neg Hx     Allergies: Allergies  Allergen Reactions   Vicks Nyquil Cold & Flu Night [Dm-Apap-Cpm]     Red in face and hot    Medications Prior to Admission  Medication Sig Dispense Refill Last Dose   famotidine (PEPCID) 20 MG tablet TAKE 1 TABLET BY MOUTH TWICE A DAY 180 tablet 1 10/10/2022   ferrous sulfate (FERROUSUL) 325 (65 FE) MG tablet Take 1 tablet (325 mg total) by mouth every other day for 45 doses. 45 tablet 0 Past Week   Prenatal Vit-Fe Fumarate-FA (MULTIVITAMIN-PRENATAL) 27-0.8 MG TABS tablet Take 1 tablet by mouth daily at 12 noon.   Past Month   Doxylamine-Pyridoxine (DICLEGIS) 10-10 MG TBEC Take 2 tablets by mouth at bedtime. If symptoms persist, add one tablet in the morning and one in the afternoon (Patient not taking: Reported on 05/26/2022) 100 tablet 5    ondansetron (ZOFRAN-ODT) 4 MG disintegrating tablet TAKE 1 TABLET BY MOUTH EVERY 6 HOURS AS NEEDED FOR NAUSEA 18 tablet 2 Unknown   promethazine (PHENERGAN) 12.5 MG tablet Take 1 tablet (12.5 mg total) by mouth every 6 (six) hours as needed for nausea or vomiting. Take 30 minutes before Iron tablet to reduce episodes of vomiting (Patient not taking: Reported on 09/14/2022) 30 tablet 0      Review of Systems   All systems reviewed and negative except as stated in HPI  Blood pressure 115/60, pulse 80, temperature 98.6 F (37 C), temperature source Oral, resp. rate 17, height 5\' 2"  (1.575 m), weight 79.6 kg, last menstrual period 01/10/2022, unknown if currently breastfeeding. General appearance: alert, cooperative, and appears stated age Lungs: clear to auscultation bilaterally Heart: regular rate and rhythm Abdomen: soft, non-tender; bowel sounds normal Pelvic: see below Extremities:  Homans sign is negative, no sign of DVT  Presentation: cephalic Fetal monitoring: 140 bpm, moderate variability, +accels, no decels. Uterine activity: q 3-4 mins per toco  Cervical exam: 1.5 / 70% /station -2  Prenatal labs:  ABO, Rh: --/--/A POS (11/15 6294) Antibody: NEG (11/15 0039) Rubella: 3.03 (05/03 1046) RPR: Non Reactive (08/22 0950)  HBsAg: Negative (05/03 1046)  HIV: Non Reactive (08/22 0950)  GBS: Negative/-- (10/26 0849)  2 hr Glucola normal Genetic screening  low risk Anatomy US normal, except LGA  Prenatal Transfer Tool  Maternal Diabetes: No Genetic Screening: Normal Maternal Ultrasounds/Referrals: Normal, LGA Fetal Ultrasounds or other Referrals:  None Maternal Substance Abuse:  No Significant Maternal Medications:  None Significant Maternal Lab Results:  Group B Strep negative Number of Prenatal Visits:greater than 3 verified prenatal visits Other Comments:  None  Results for orders placed or performed during the hospital encounter of 10/11/22 (from the past 24 hour(s))  CBC   Collection Time: 10/11/22 12:39 AM  Result Value Ref Range   WBC 8.4 4.0 - 10.5 K/uL   RBC 4.08 3.87 - 5.11 MIL/uL   Hemoglobin 10.8 (L) 12.0 - 15.0 g/dL   HCT 76.5 (L) 46.5 - 03.5 %   MCV 81.4 80.0 - 100.0 fL   MCH 26.5 26.0 - 34.0 pg   MCHC 32.5 30.0 - 36.0 g/dL   RDW 46.5 68.1 - 27.5 %   Platelets 233 150 - 400 K/uL   nRBC 0.0 0.0 - 0.2 %  Type and screen   Collection Time: 10/11/22 12:39 AM  Result Value Ref Range   ABO/RH(D) A POS    Antibody Screen NEG    Sample Expiration      10/14/2022,2359 Performed at [facility], [address], [place], [place] [address]     Patient Active Problem List   Diagnosis Date Noted   LGA (large for gestational age) fetus affecting management of mother, third trimester, other fetus 10/11/2022   Excessive fetal growth affecting management of mother, antepartum 07/18/2022   Previous cesarean delivery affecting pregnancy,  antepartum 03/29/2022   Supervision of high risk pregnancy, antepartum 03/06/2022   Hypertriglyceridemia 07/20/2021   History of child affected by Down syndrome    Headache disorder 09/04/2018    Assessment/Plan:  Caroline [name] is a 26 y.o. [id number] at [redacted]w[redacted]d here for scheduled IOL TOLAC due to LGA baby. She is doing well otherwise. No history of GDM.  #Labor:admit to L&D, started on pitocin, low dose. FB placed #Pain: Desire epidural when active. Fentanyl IV prn. #FWB: Cat 1 #ID:  GBS negative  #MOF: breast #MOC:undecided #Circ:  N/a  Sheppard Evens MD MPH OB Fellow, Faculty Practice [facility] 10/11/2022

## 2022-10-11 NOTE — Progress Notes (Signed)
LABOR PROGRESS NOTE  Caroline [name] is a 26 y.o. [phone] at [redacted]w[redacted]d  admitted for IOL/TOLAC for LGA baby.  Subjective: Patient reports increase in pressure  Objective: BP (!) 127/100   Pulse (!) 112   Temp 99.5 F (37.5 C) (Oral)   Resp 18   Ht 5\' 2"  (1.575 m)   Wt 79.6 kg   LMP 01/10/2022   SpO2 100%   BMI 32.10 kg/m  or    Dilation: 10 Dilation Complete Date: 10/11/22 Dilation Complete Time: 1446 Effacement (%): 100 Station: Plus 1 Presentation: Vertex Exam by:: [email or web address] CNM  FHT: 145 bpm, mdoerate variability, +15x15 accels, +early/variable decels Toco: q 1.5-3 mins  Labs: Lab Results  Component Value Date   WBC 8.4 10/11/2022   HGB 10.8 (L) 10/11/2022   HCT 33.2 (L) 10/11/2022   MCV 81.4 10/11/2022   PLT 233 10/11/2022    Patient Active Problem List   Diagnosis Date Noted   LGA (large for gestational age) fetus affecting management of mother, third trimester, other fetus 10/11/2022   Excessive fetal growth affecting management of mother, antepartum 07/18/2022   Previous cesarean delivery affecting pregnancy, antepartum 03/29/2022   Supervision of high risk pregnancy, antepartum 03/06/2022   Hypertriglyceridemia 07/20/2021   History of child affected by Down syndrome    Headache disorder 09/04/2018    Assessment / Plan: 26 y.o. 26 at [redacted]w[redacted]d here for IOL/TOLAC for LGA  Labor: Labor down and reassess in 1 hour Fetal Wellbeing: Cat 2, overall reassuring Pain Control:  Epidural in place Anticipated MOD:  VBAC   [redacted]w[redacted]d, CNM 10/11/2022, 3:37 PM

## 2022-10-11 NOTE — Progress Notes (Signed)
LABOR PROGRESS NOTE  Caroline [name] is a 26 y.o. [phone] at [redacted]w[redacted]d  admitted for IOL/TOLAC for LGA baby.  Subjective: Doing much better after epidural.  Objective: BP 113/75   Pulse 87   Temp 98.8 F (37.1 C) (Oral)   Resp 16   Ht 5\' 2"  (1.575 m)   Wt 79.6 kg   LMP 01/10/2022   SpO2 100%   BMI 32.10 kg/m  or  Vitals:   10/11/22 1000 10/11/22 1001 10/11/22 1005 10/11/22 1031  BP:  115/68  113/75  Pulse:  76  87  Resp:  16  16  Temp:      TempSrc:      SpO2: 100%  100%   Weight:      Height:         Dilation: 7 Effacement (%): 70 Station: -1 Presentation: Vertex Exam by:: [email or web address] CNM FHT: baseline rate 150, moderate variability, accels, no decels Toco: q 2-3 min  Labs: Lab Results  Component Value Date   WBC 8.4 10/11/2022   HGB 10.8 (L) 10/11/2022   HCT 33.2 (L) 10/11/2022   MCV 81.4 10/11/2022   PLT 233 10/11/2022    Patient Active Problem List   Diagnosis Date Noted   LGA (large for gestational age) fetus affecting management of mother, third trimester, other fetus 10/11/2022   Excessive fetal growth affecting management of mother, antepartum 07/18/2022   Previous cesarean delivery affecting pregnancy, antepartum 03/29/2022   Supervision of high risk pregnancy, antepartum 03/06/2022   Hypertriglyceridemia 07/20/2021   History of child affected by Down syndrome    Headache disorder 09/04/2018    Assessment / Plan: 26 y.o. 26 at 110w1d here for IOL/TOLAC for LGA  Labor: Mostly unchanged from previous check before epidural. AROM's w/scant clear fluid and bloody show Fetal Wellbeing:  Cat 1 Pain Control:  Epidural in place Anticipated MOD:  VBAC  [redacted]w[redacted]d, MD PGY-1 Family Medicine Resident [facility] 10/11/2022, 10:38 AM

## 2022-10-11 NOTE — Anesthesia Procedure Notes (Signed)
Epidural Patient location during procedure: OB Start time: 10/11/2022 9:25 AM End time: 10/11/2022 9:29 AM  Staffing Anesthesiologist: Leilani Able, MD Performed: anesthesiologist   Preanesthetic Checklist Completed: patient identified, IV checked, site marked, risks and benefits discussed, surgical consent, monitors and equipment checked, pre-op evaluation and timeout performed  Epidural Patient position: sitting Prep: DuraPrep and site prepped and draped Patient monitoring: continuous pulse ox and blood pressure Approach: midline Location: L3-L4 Injection technique: LOR air  Needle:  Needle type: Tuohy  Needle gauge: 17 G Needle length: 9 cm and 9 Needle insertion depth: 5 cm cm Catheter type: closed end flexible Catheter size: 19 Gauge Catheter at skin depth: 10 cm Test dose: negative and Other  Assessment Events: blood not aspirated, injection not painful, no injection resistance, no paresthesia and negative IV test  Additional Notes Reason for block:procedure for pain

## 2022-10-11 NOTE — Discharge Summary (Signed)
Postpartum Discharge Summary  Date of Service updated***     Patient Name: Caroline [name] DOB: 08-02-1996 MRN: [id number]  Date of admission: 10/11/2022 Delivery date:10/11/2022  Delivering provider: Janyth Pupa  Date of discharge: 10/11/2022  Admitting diagnosis: LGA (large for gestational age) fetus affecting management of mother, third trimester, other fetus [O20.63X9] Intrauterine pregnancy: [redacted]w[redacted]d    Secondary diagnosis:  Principal Problem:   LGA (large for gestational age) fetus affecting management of mother, third trimester, other fetus Active Problems:   History of child affected by Down syndrome   Supervision of high risk pregnancy, antepartum   Headache disorder   Previous cesarean delivery affecting pregnancy, antepartum   Excessive fetal growth affecting management of mother, antepartum   SVD (spontaneous vaginal delivery)  Additional problems: ***    Discharge diagnosis: Term Pregnancy Delivered                                              Post partum procedures:{Postpartum procedures:23558} Augmentation: AROM, Pitocin, and IP Foley Complications: None  Hospital course: Induction of Labor With Vaginal Delivery   26y.o. yo [id number] [id number] admitted to the hospital 10/11/2022 for induction of labor.  Indication for induction: Elective and LGA .  Patient had an labor course complicated by none Membrane Rupture Time/Date: 10:15 AM ,10/11/2022   Delivery Method:VBAC, Spontaneous  Episiotomy: Right Mediolateral  Lacerations:  2nd degree;Perineal  Details of delivery can be found in separate delivery note.  Patient had a postpartum course complicated by***. Patient is discharged home 10/11/22.  Newborn Data: Birth date:10/11/2022  Birth time:6:35 PM  Gender:Female  Living status:Living  Apgars:9 ,10  Weight:   Magnesium Sulfate received: {Mag received:30440022} BMZ received: No Rhophylac:N/A MMR:N/A T-DaP:{Tdap:23962} Flu:  {F{KKX:38182}ransfusion:{Transfusion received:30440034}  Physical exam  Vitals:   10/11/22 1631 10/11/22 1802 10/11/22 1856 10/11/22 1901  BP: (!) 103/58  (!) 110/50 (!) 107/55  Pulse: (!) 110  79 79  Resp: 16     Temp:  99.4 F (37.4 C)    TempSrc:  Axillary    SpO2:      Weight:      Height:       General: {Exam; general:21111117} Lochia: {Desc; appropriate/inappropriate:30686::"appropriate"} Uterine Fundus: {Desc; firm/soft:30687} Incision: {Exam; incision:21111123} DVT Evaluation: {Exam; dvt:2111122} Labs: Lab Results  Component Value Date   WBC 8.4 10/11/2022   HGB 10.8 (L) 10/11/2022   HCT 33.2 (L) 10/11/2022   MCV 81.4 10/11/2022   PLT 233 10/11/2022      Latest Ref Rng & Units 04/27/2022    9:01 AM  CMP  Glucose 70 - 99 mg/dL 88   BUN 6 - 20 mg/dL 6   Creatinine 0.57 - 1.00 mg/dL 0.51   Sodium 134 - 144 mmol/L 137   Potassium 3.5 - 5.2 mmol/L 4.0   Chloride 96 - 106 mmol/L 101   CO2 20 - 29 mmol/L 19   Calcium 8.7 - 10.2 mg/dL 9.2    Edinburgh Score:     No data to display           After visit meds:  Allergies as of 10/11/2022       Reactions   Vicks Nyquil Cold & Flu Night [dm-apap-cpm]    Red in face and hot     Med Rec must be completed prior to using this  [facility] [place]***        Discharge home in stable condition Infant Feeding: {Baby feeding:23562} Infant Disposition:{CHL IP OB HOME WITH UYWSBB:79536} Discharge instruction: per After Visit Summary and Postpartum booklet. Activity: Advance as tolerated. Pelvic rest for 6 weeks.  Diet: {OB VQOH:00979499} Future Appointments:No future appointments. Follow up Visit:   Please schedule this patient for a In person postpartum visit in 6 weeks with the following provider: Any provider. Additional Postpartum F/U:{PP Procedure:23957}  High risk pregnancy complicated by:  LGA Delivery mode:  VBAC, Spontaneous  Anticipated Birth Control:  Unsure   10/11/2022 Renee Harder,  CNM

## 2022-10-11 NOTE — Anesthesia Preprocedure Evaluation (Signed)
Anesthesia Evaluation  Patient identified by MRN, date of birth, ID band Patient awake    Reviewed: Allergy & Precautions, H&P , NPO status , Patient's Chart, lab work & pertinent test results  Airway Mallampati: I       Dental no notable dental hx.    Pulmonary neg pulmonary ROS   Pulmonary exam normal        Cardiovascular negative cardio ROS Normal cardiovascular exam     Neuro/Psych negative neurological ROS  negative psych ROS   GI/Hepatic negative GI ROS, Neg liver ROS,,,  Endo/Other  negative endocrine ROSdiabetes, Gestational    Renal/GU negative Renal ROS  negative genitourinary   Musculoskeletal negative musculoskeletal ROS (+)    Abdominal  (+) + obese  Peds  Hematology negative hematology ROS (+)   Anesthesia Other Findings   Reproductive/Obstetrics (+) Pregnancy Placenta previa                             Anesthesia Physical Anesthesia Plan  ASA: II  Anesthesia Plan: Epidural   Post-op Pain Management:    Induction:   PONV Risk Score and Plan:   Airway Management Planned:   Additional Equipment:   Intra-op Plan:   Post-operative Plan:   Informed Consent: I have reviewed the patients History and Physical, chart, labs and discussed the procedure including the risks, benefits and alternatives for the proposed anesthesia with the patient or authorized representative who has indicated his/her understanding and acceptance.       Plan Discussed with:   Anesthesia Plan Comments:         Anesthesia Quick Evaluation

## 2022-10-11 NOTE — Plan of Care (Signed)

## 2022-10-11 NOTE — Progress Notes (Signed)
LABOR PROGRESS NOTE  Caroline [name] is a 26 y.o. [phone] at [redacted]w[redacted]d  admitted for IOL/TOLAC for LGA baby.  Subjective: Patient reports feeling sudden onset of pelvic/rectal pressure.  Objective: BP 107/62   Pulse 77   Temp 98.8 F (37.1 C) (Oral)   Resp 16   Ht 5\' 2"  (1.575 m)   Wt 79.6 kg   LMP 01/10/2022   SpO2 100%   BMI 32.10 kg/m  or    Dilation: 8 Effacement (%): 90 Station: 0 Presentation: Vertex Exam by:: Aundra Espin CNM  FHT: 145 bpm, mdoerate variability, +15x15 accels, +early/variable decels Toco: q 1.5-3 mins  Labs: Lab Results  Component Value Date   WBC 8.4 10/11/2022   HGB 10.8 (L) 10/11/2022   HCT 33.2 (L) 10/11/2022   MCV 81.4 10/11/2022   PLT 233 10/11/2022    Patient Active Problem List   Diagnosis Date Noted   LGA (large for gestational age) fetus affecting management of mother, third trimester, other fetus 10/11/2022   Excessive fetal growth affecting management of mother, antepartum 07/18/2022   Previous cesarean delivery affecting pregnancy, antepartum 03/29/2022   Supervision of high risk pregnancy, antepartum 03/06/2022   Hypertriglyceridemia 07/20/2021   History of child affected by Down syndrome    Headache disorder 09/04/2018    Assessment / Plan: 26 y.o. 26 at [redacted]w[redacted]d here for IOL/TOLAC for LGA  Labor: Progressing. Pitocin currently on 70mu/min. Continue titration prn per unit policy Fetal Wellbeing: Cat 2, overall reassuring Pain Control:  Epidural in place Anticipated MOD:  VBAC   9m, CNM 10/11/2022, 12:23 PM

## 2022-10-11 NOTE — Lactation Note (Signed)
This note was copied from a baby's chart. Lactation Consultation Note  Patient Name: Caroline [name] Date: 10/11/2022   Age:28 hours Mom has a migraine at this time. Room is dark and quiet. LC hopes to see mom on MBU. Maternal Data    Feeding    LATCH Score                    Lactation Tools Discussed/Used    Interventions    Discharge    Consult Status      Charyl Dancer 10/11/2022, 7:55 PM

## 2022-10-12 ENCOUNTER — Encounter: Payer: 59 | Admitting: Advanced Practice Midwife

## 2022-10-12 DIAGNOSIS — O34219 Maternal care for unspecified type scar from previous cesarean delivery: Secondary | ICD-10-CM

## 2022-10-12 LAB — CBC
HCT: 27.8 % — ABNORMAL LOW (ref 36.0–46.0)
Hemoglobin: 9.2 g/dL — ABNORMAL LOW (ref 12.0–15.0)
MCH: 26.7 pg (ref 26.0–34.0)
MCHC: 33.1 g/dL (ref 30.0–36.0)
MCV: 80.8 fL (ref 80.0–100.0)
Platelets: 203 10*3/uL (ref 150–400)
RBC: 3.44 MIL/uL — ABNORMAL LOW (ref 3.87–5.11)
RDW: 14.9 % (ref 11.5–15.5)
WBC: 12.8 10*3/uL — ABNORMAL HIGH (ref 4.0–10.5)
nRBC: 0 % (ref 0.0–0.2)

## 2022-10-12 MED ORDER — IBUPROFEN 600 MG PO TABS: 600.0000 mg | ORAL_TABLET | Freq: Four times a day (QID) | ORAL | 0 refills | Status: DC

## 2022-10-12 NOTE — Anesthesia Postprocedure Evaluation (Signed)
Anesthesia Post Note  Patient: Caroline [name]  Procedure(s) Performed: AN AD HOC LABOR EPIDURAL     Patient location during evaluation: Mother Baby Anesthesia Type: Epidural Level of consciousness: awake, oriented and awake and alert Pain management: pain level controlled Vital Signs Assessment: post-procedure vital signs reviewed and stable Respiratory status: spontaneous breathing, respiratory function stable and nonlabored ventilation Cardiovascular status: stable Postop Assessment: no headache, adequate PO intake, able to ambulate, patient able to bend at knees and no apparent nausea or vomiting Anesthetic complications: no   No notable events documented.  Last Vitals:  Vitals:   10/12/22 0538 10/12/22 0835  BP: 103/69 112/76  Pulse: 81 71  Resp: 16 16  Temp: 37.1 C 37.3 C  SpO2: 97% 100%    Last Pain:  Vitals:   10/12/22 0835  TempSrc: Oral  PainSc: 4    Pain Goal:                   Rakin Lemelle

## 2022-10-12 NOTE — Lactation Note (Signed)
This note was copied from a baby's chart. Lactation Consultation Note  Patient Name: Caroline [name] [id number] Date: 10/12/2022 Reason for consult: Initial assessment;Term Age:28 hours Assisted baby to the breast. Mom stated it hurts when the baby latches. LC flanged lips wide. Baby doing good breast compressions when feeding.  Noted baby has thick labial frenulum to the bottom of top gum line. Needs lips flanged frequently. Mom stated her 1st daughter had to get her clipped as well. Baby was latched well. Mom stated the pain was worse at first. Nipples look intact. Newborn feeding habits, STS, I&O, positioning, and support reviewed. Mom encouraged to feed baby 8-12 times/24 hours and with feeding cues.  Encouraged mom to call for assistance as needed. Mom didn't BF her 1st child d/t being in NICU. Mom stated she pumped for a little bit but never really had good milk. LC asked RN to set mom up w/DEBP.  Maternal Data Has patient been taught Hand Expression?: Yes Does the patient have breastfeeding experience prior to this delivery?: Yes How long did the patient breastfeed?: pumped some  Feeding    LATCH Score Latch: Grasps breast easily, tongue down, lips flanged, rhythmical sucking.  Audible Swallowing: A few with stimulation  Type of Nipple: Everted at rest and after stimulation  Comfort (Breast/Nipple): Soft / non-tender  Hold (Positioning): Assistance needed to correctly position infant at breast and maintain latch.  LATCH Score: 8   Lactation Tools Discussed/Used    Interventions Interventions: Breast feeding basics reviewed;Adjust position;DEBP;Assisted with latch;Support pillows;Skin to skin;Position options;Breast massage;Breast compression;LC Services brochure;Hand express  Discharge    Consult Status Consult Status: Follow-up Date: 10/12/22 Follow-up type: In-patient    Charyl Dancer 10/12/2022, 2:47 AM

## 2022-10-12 NOTE — Progress Notes (Signed)
Post Partum Day 1 Subjective: Patient is a 26 yo [id number] s/p spontaneous VBAC at [redacted]w[redacted]d. No acute overnight events. Patient denies issues with ambulating, voiding, or PO intake. Pain is well controlled. Denies heavy bleeding or clotting. Denies nausea, vomiting, headache, dizziness, and dyspnea.      Objective: Blood pressure 103/69, pulse 81, temperature 98.7 F (37.1 C), temperature source Oral, resp. rate 16, height 5\' 2"  (1.575 m), weight 79.6 kg, last menstrual period 01/10/2022, SpO2 97 %, unknown if currently breastfeeding.  Physical Exam:  General: alert, cooperative, and no distress Lochia: appropriate Uterine Fundus: firm Incision: healing well, no significant drainage, no significant erythema DVT Evaluation: No evidence of DVT seen on physical exam. No cords or calf tenderness. No significant calf/ankle edema.  Recent Labs    10/11/22 0039 10/12/22 0407  HGB 10.8* 9.2*  HCT 33.2* 27.8*    Assessment/Plan: PPD#1 VBAC - doing well, pain controlled.  Asymptomatic anemia - Most recent Hgb 9.2 on 10/12/2022. Will start oral iron.  Contraception: Undecided. Patient desires more time for consideration of options.  Feeding: breast  Disposition: stable, doing well. Patient would like to be discharged home today.    LOS: 1 day   10/14/2022, Student-PA 10/12/2022, 7:41 AM

## 2022-10-13 ENCOUNTER — Encounter: Payer: Self-pay | Admitting: Advanced Practice Midwife

## 2022-10-18 ENCOUNTER — Telehealth (HOSPITAL_COMMUNITY): Payer: Self-pay | Admitting: *Deleted

## 2022-10-18 NOTE — Telephone Encounter (Signed)
Mom reports feeling good. No concerns about herself at this time. EPDS=0 [facility] score=0) Mom reports baby is doing well. Feeding, peeing, and pooping without difficulty. Safe sleep reviewed. Mom reports no concerns about baby at present.  Duffy Rhody, RN 10-18-2022 at 9:15am

## 2022-11-23 ENCOUNTER — Ambulatory Visit (INDEPENDENT_AMBULATORY_CARE_PROVIDER_SITE_OTHER): Payer: 59 | Admitting: Advanced Practice Midwife

## 2022-11-23 ENCOUNTER — Encounter: Payer: Self-pay | Admitting: Advanced Practice Midwife

## 2022-11-23 VITALS — BP 100/66 | HR 74 | Wt 146.0 lb

## 2022-11-23 DIAGNOSIS — O34219 Maternal care for unspecified type scar from previous cesarean delivery: Secondary | ICD-10-CM

## 2022-11-23 DIAGNOSIS — Z789 Other specified health status: Secondary | ICD-10-CM | POA: Diagnosis not present

## 2022-11-23 DIAGNOSIS — R159 Full incontinence of feces: Secondary | ICD-10-CM

## 2022-11-23 DIAGNOSIS — Z9889 Other specified postprocedural states: Secondary | ICD-10-CM

## 2022-11-23 NOTE — Progress Notes (Signed)
Post Partum Visit Note  Caroline [name] is a 26 y.o. [phone] female who presents for a postpartum visit. She is 6 weeks postpartum following a normal spontaneous vaginal delivery.  I have fully reviewed the prenatal and intrapartum course. The delivery was at 39 gestational weeks.  Anesthesia: epidural. Postpartum course has been unremarkable. Baby is doing well. Baby is feeding by bottle - Similac Advance. Bleeding no bleeding. Bowel function is normal. Bladder function is normal. Patient is sexually active. Contraception method is none. Postpartum depression screening: negative. EPDS =0  The pregnancy intention screening data noted above was reviewed. Patient reports two episodes of sexual intercourse, condoms used as birth control. She declines counseling on methods and plans to continue condom use for now.  Patient's postpartum course was complicated by an outbreak of RSV, requiring her baby to be hospitalized at [facility] for five days. Everyone is home, recovered and doing well now.   Patient reports perineal pain. She was not aware that she had an episiotomy performed and was not familiar with this procedure and what it entailed.   Patient reports two episodes of incontinence of stool.    Health Maintenance Due  Topic Date Due   COVID-19 Vaccine (1) Never done   INFLUENZA VACCINE  06/27/2022    The following portions of the patient's history were reviewed and updated as appropriate: allergies, current medications, past family history, past medical history, past social history, past surgical history, and problem list.  Review of Systems A comprehensive review of systems was negative.  Objective:  BP 100/66   Pulse 74   Wt 146 lb (66.2 kg)   Breastfeeding No   BMI 26.70 kg/m    General:  alert, cooperative, and no distress   Breasts:  not indicated  Lungs: Normal work of breathing  Heart:  normal apical impulse  Abdomen: soft, non-tender; bowel sounds normal; no masses,   no organomegaly   GU exam:  External genitalia normal, Bimanual: episiotomy repair and second degree laceration healing appropriately. Posterior aspect is intact, no pocketing or evidence of impaired integrity to vaginal walls. Neg CMT, uterus nontender, no adnexal tenderness noted.        Assessment:   Hx VBAC, recovering well Continue condom use per patient preference Reviewed definition of episiotomy, expectations for healing, no evidence of impaired healing today. Patient tolerate bimanual extremely well with slow intentional movements, continue same for sexual intercourse. Can continue stool softeners as needed Apology offered to patient her postpartum inpatient counseling did not include memorable review of episiotomy and postpartum care. Patient not seen at [facility] for one week followup as is typical for [facility].   Plan:   Essential components of care per ACOG recommendations:  1.  Mood and well being: Patient with negative depression screening today. Reviewed local resources for support.  - Patient tobacco use? No.   - hx of drug use? No.    2. Infant care and feeding:  -Patient currently breastmilk feeding? No.   3. Sexuality, contraception and birth spacing - Patient does not want a pregnancy in the next year.  Counseling declined, plans to continue 100% condom use - Discussed birth spacing of 18 months  4. Sleep and fatigue -Encouraged family/partner/community support of 4 hrs of uninterrupted sleep to help with mood and fatigue  5. Physical Recovery  - Discussed patients delivery and complications. She describes her labor as good. - Patient had a  VBAC with episiotomy and 2nd degree laceration . Perineal  healing reviewed. Patient expressed understanding - Patient has urinary incontinence? No. - Patient has resumed sexual activity  6.  Health Maintenance - HM due items addressed Yes -  Pap smear not done at today's visit. Last pap smear:  Diagnosis  Date Value Ref  Range Status  05/20/2020   Final   - Negative for Intraepithelial Lesions or Malignancy (NILM)  05/20/2020 - Benign reactive/reparative changes  Final   -Breast Cancer screening indicated? No.   7. Chronic Disease/Pregnancy Condition follow up: None  - PCP follow up  RTC 04/2023 for pap  Clayton Bibles, MSA, MSN, CNM Certified Nurse Midwife, [facility] for [employer], [facility]

## 2022-11-28 ENCOUNTER — Other Ambulatory Visit: Payer: Self-pay | Admitting: *Deleted

## 2022-11-28 MED ORDER — FLUCONAZOLE 150 MG PO TABS: 150.0000 mg | ORAL_TABLET | Freq: Once | ORAL | 3 refills | Status: DC

## 2023-01-01 ENCOUNTER — Other Ambulatory Visit: Payer: Self-pay | Admitting: Obstetrics & Gynecology

## 2023-01-15 ENCOUNTER — Encounter: Payer: Self-pay | Admitting: Obstetrics and Gynecology

## 2023-01-17 ENCOUNTER — Encounter: Payer: Self-pay | Admitting: Family Medicine

## 2023-01-17 ENCOUNTER — Other Ambulatory Visit (HOSPITAL_COMMUNITY)
Admission: RE | Admit: 2023-01-17 | Discharge: 2023-01-17 | Disposition: A | Discharge status: Home / Self Care | Payer: 59 | Source: Ambulatory Visit | Attending: Family Medicine | Admitting: Family Medicine

## 2023-01-17 ENCOUNTER — Ambulatory Visit (INDEPENDENT_AMBULATORY_CARE_PROVIDER_SITE_OTHER): Payer: 59 | Admitting: Family Medicine

## 2023-01-17 VITALS — BP 116/79 | HR 69 | Wt 144.0 lb

## 2023-01-17 DIAGNOSIS — R87611 Atypical squamous cells cannot exclude high grade squamous intraepithelial lesion on cytologic smear of cervix (ASC-H): Secondary | ICD-10-CM | POA: Diagnosis not present

## 2023-01-17 DIAGNOSIS — Z124 Encounter for screening for malignant neoplasm of cervix: Secondary | ICD-10-CM

## 2023-01-17 DIAGNOSIS — R102 Pelvic and perineal pain: Secondary | ICD-10-CM | POA: Diagnosis not present

## 2023-01-17 DIAGNOSIS — Z1151 Encounter for screening for human papillomavirus (HPV): Secondary | ICD-10-CM | POA: Diagnosis not present

## 2023-01-17 DIAGNOSIS — G8918 Other acute postprocedural pain: Secondary | ICD-10-CM

## 2023-01-17 MED ORDER — LIDOCAINE 5 % EX OINT: 1.0000 | TOPICAL_OINTMENT | CUTANEOUS | 1 refills | Status: DC | PRN

## 2023-01-17 NOTE — Progress Notes (Signed)
   Subjective:    Patient ID: Caroline [name] is a 27 y.o. female presenting with Follow-up  on 01/17/2023  HPI: Still feels very sensitive to wiping and notes pain between her bottom and her vagina. She does not know if she has hemorrhoids. Has no constipation. Sex is not happening. Using condoms, some times.  Review of Systems  Constitutional:  Negative for chills and fever.  Respiratory:  Negative for shortness of breath.   Cardiovascular:  Negative for chest pain.  Gastrointestinal:  Negative for abdominal pain, nausea and vomiting.  Genitourinary:  Negative for dysuria.  Skin:  Negative for rash.      Objective:    BP 116/79   Pulse 69   Wt 144 lb (65.3 kg)   LMP 01/08/2023 (Approximate)   Breastfeeding No   BMI 26.34 kg/m  Physical Exam Exam conducted with a chaperone present.  Constitutional:      General: She is not in acute distress.    Appearance: She is well-developed.  HENT:     Head: Normocephalic and atraumatic.  Eyes:     General: No scleral icterus. Cardiovascular:     Rate and Rhythm: Normal rate.  Pulmonary:     Effort: Pulmonary effort is normal.  Abdominal:     Palpations: Abdomen is soft.  Genitourinary:    Vagina: Normal.     Cervix: Normal.       Comments: No hemorrhoids Musculoskeletal:     Cervical back: Neck supple.  Skin:    General: Skin is warm and dry.  Neurological:     Mental Status: She is alert and oriented to person, place, and time.         Assessment & Plan:   Screening for cervical cancer - pap smear today - Plan: Cytology - PAP( [place])  Episiotomy pain - likely related to scar tissue, given lidocaine ointment, and then massage area to break up scar tissue, several times/day - Plan: lidocaine (XYLOCAINE) 5 % ointment  Return if symptoms worsen or fail to improve.  Donnamae Jude, MD 01/17/2023 11:14 AM

## 2023-01-17 NOTE — Progress Notes (Signed)
CC: Follow up Perineum area  Sore with bowel movements

## 2023-01-23 LAB — CYTOLOGY - PAP
Comment: NEGATIVE
Comment: NEGATIVE
Comment: NEGATIVE
Diagnosis: HIGH — AB
HPV 16: NEGATIVE
HPV 18 / 45: NEGATIVE
High risk HPV: POSITIVE — AB

## 2023-01-24 ENCOUNTER — Telehealth: Payer: Self-pay | Admitting: *Deleted

## 2023-01-24 ENCOUNTER — Encounter: Payer: Self-pay | Admitting: Family Medicine

## 2023-01-24 NOTE — Telephone Encounter (Signed)
Called pt to discuss results and scheduled colpo for 3/13 at 11:15

## 2023-01-24 NOTE — Telephone Encounter (Signed)
-----   Message from Donnamae Jude, MD sent at 01/23/2023  4:02 PM EST ----- Needs colpo

## 2023-01-26 DIAGNOSIS — D069 Carcinoma in situ of cervix, unspecified: Secondary | ICD-10-CM

## 2023-01-26 HISTORY — DX: Carcinoma in situ of cervix, unspecified: D06.9

## 2023-01-30 ENCOUNTER — Encounter: Payer: Self-pay | Admitting: Family Medicine

## 2023-01-31 ENCOUNTER — Ambulatory Visit (INDEPENDENT_AMBULATORY_CARE_PROVIDER_SITE_OTHER): Payer: 59 | Admitting: Family Medicine

## 2023-01-31 ENCOUNTER — Other Ambulatory Visit (HOSPITAL_COMMUNITY)
Admission: RE | Admit: 2023-01-31 | Discharge: 2023-01-31 | Disposition: A | Discharge status: Home / Self Care | Payer: 59 | Source: Ambulatory Visit | Attending: Family Medicine | Admitting: Family Medicine

## 2023-01-31 VITALS — BP 114/67 | HR 69

## 2023-01-31 DIAGNOSIS — Z3202 Encounter for pregnancy test, result negative: Secondary | ICD-10-CM | POA: Diagnosis not present

## 2023-01-31 DIAGNOSIS — R87611 Atypical squamous cells cannot exclude high grade squamous intraepithelial lesion on cytologic smear of cervix (ASC-H): Secondary | ICD-10-CM | POA: Insufficient documentation

## 2023-01-31 DIAGNOSIS — R87810 Cervical high risk human papillomavirus (HPV) DNA test positive: Secondary | ICD-10-CM

## 2023-01-31 LAB — POCT URINE PREGNANCY: Preg Test, Ur: NEGATIVE

## 2023-01-31 NOTE — Progress Notes (Signed)
    GYNECOLOGY OFFICE COLPOSCOPY PROCEDURE NOTE  27 y.o. [id number] here for colposcopy for ASC cannot exclude high grade lesion [facility]) with Pos HPV pap smear on 01/17/2023. Discussed role for HPV in cervical dysplasia, need for surveillance.  Patient gave informed written consent, time out was performed.  Placed in lithotomy position. Cervix viewed with speculum and colposcope after application of acetic acid.   Colposcopy adequate? Yes  acetowhite lesion(s) noted at SCJ, 4 quadrant biopsies obtained.  ECC specimen obtained. All specimens were labeled and sent to pathology.  Chaperone was present during entire procedure.  Patient was given post procedure instructions.  Will follow up pathology and manage accordingly; patient will be contacted with results and recommendations.  Routine preventative health maintenance measures emphasized.   Donnamae Jude, MD 01/31/2023 3:09 PM

## 2023-02-05 LAB — SURGICAL PATHOLOGY

## 2023-02-07 ENCOUNTER — Encounter: Payer: 59 | Admitting: Family Medicine

## 2023-02-28 ENCOUNTER — Other Ambulatory Visit (HOSPITAL_COMMUNITY)
Admission: RE | Admit: 2023-02-28 | Discharge: 2023-02-28 | Disposition: A | Discharge status: Home / Self Care | Payer: 59 | Source: Ambulatory Visit | Attending: Family Medicine | Admitting: Family Medicine

## 2023-02-28 ENCOUNTER — Encounter: Payer: Self-pay | Admitting: Family Medicine

## 2023-02-28 ENCOUNTER — Ambulatory Visit (INDEPENDENT_AMBULATORY_CARE_PROVIDER_SITE_OTHER): Payer: 59 | Admitting: Family Medicine

## 2023-02-28 VITALS — BP 115/75 | HR 71 | Wt 147.0 lb

## 2023-02-28 DIAGNOSIS — D069 Carcinoma in situ of cervix, unspecified: Secondary | ICD-10-CM

## 2023-02-28 DIAGNOSIS — Z3202 Encounter for pregnancy test, result negative: Secondary | ICD-10-CM

## 2023-02-28 DIAGNOSIS — R87611 Atypical squamous cells cannot exclude high grade squamous intraepithelial lesion on cytologic smear of cervix (ASC-H): Secondary | ICD-10-CM | POA: Insufficient documentation

## 2023-02-28 LAB — POCT URINE PREGNANCY: Preg Test, Ur: NEGATIVE

## 2023-02-28 NOTE — Progress Notes (Signed)
RGYN Patient presents for LEEP.

## 2023-02-28 NOTE — Progress Notes (Signed)
    GYNECOLOGY OFFICE PROCEDURE NOTE Caroline [name] is a 27 y.o. [phone] here for LEEP. No GYN concerns. Pap smear and colposcopy history reviewed.    Pap ASC-H Colpo Biopsy CIN3 ECC fragment of CIN3 but no endocervical involvement  Risks, benefits, alternatives, and limitations of procedure explained to patient, including pain, bleeding, infection, failure to remove abnormal tissue and failure to cure dysplasia, need for repeat procedures, damage to pelvic organs, cervical incompetence.  Role of HPV,cervical dysplasia and need for close followup was empasized. Informed written consent was obtained. All questions were answered. Time out performed. Urine pregnancy test was negative.  ??Procedure: The patient was placed in lithotomy position and the bivalved coated speculum was placed in the patient's vagina. A grounding pad placed on the patient. Local anesthesia was administered via an intracervical block using 20 ml of 2% Lidocaine with epinephrine. Colposcopy performed. The suction was turned on and the Medium 1X Fisher Cone Biopsy Excisor on 64 Watts of blended current was used to excise the entire transformation zone. Excellent hemostasis was achieved using roller ball coagulation set at 50 Watts coagulation current. Monsel's solution was then applied and the speculum was removed from the vagina. Specimens were sent to pathology.  ?The patient tolerated the procedure well. Post-operative instructions given to patient, including instruction to seek medical attention for persistent bright red bleeding, fever, abdominal/pelvic pain, dysuria, nausea or vomiting. She was also told about the possibility of having copious yellow to black tinged discharge for weeks. She was counseled to avoid anything in the vagina (sex/douching/tampons) for 3 weeks.   Donnamae Jude, MD 02/28/2023 10:14 AM

## 2023-03-02 LAB — SURGICAL PATHOLOGY

## 2023-03-13 ENCOUNTER — Encounter: Payer: Self-pay | Admitting: Family Medicine

## 2023-04-10 ENCOUNTER — Encounter: Payer: Self-pay | Admitting: Family Medicine

## 2023-04-10 ENCOUNTER — Ambulatory Visit (INDEPENDENT_AMBULATORY_CARE_PROVIDER_SITE_OTHER): Payer: 59 | Admitting: Family Medicine

## 2023-04-10 VITALS — BP 127/85 | HR 88 | Wt 149.0 lb

## 2023-04-10 DIAGNOSIS — Z9889 Other specified postprocedural states: Secondary | ICD-10-CM

## 2023-04-10 NOTE — Progress Notes (Signed)
CC: stopped bleeding a couple of days ago

## 2023-04-10 NOTE — Progress Notes (Signed)
    Subjective:    Patient ID: Caroline [name] is a 27 y.o. female presenting with Follow-up  on 04/10/2023  HPI: Bleeding since LEEP procedure on 02/28/2023. Seems to have slowed down more recently.   Review of Systems  Constitutional:  Negative for chills and fever.  Respiratory:  Negative for shortness of breath.   Cardiovascular:  Negative for chest pain.  Gastrointestinal:  Negative for abdominal pain, nausea and vomiting.  Genitourinary:  Negative for dysuria.  Skin:  Negative for rash.      Objective:    BP 127/85   Pulse 88   Wt 149 lb (67.6 kg)   BMI 27.25 kg/m  Physical Exam Exam conducted with a chaperone present.  Constitutional:      General: She is not in acute distress.    Appearance: She is well-developed.  HENT:     Head: Normocephalic and atraumatic.  Eyes:     General: No scleral icterus. Cardiovascular:     Rate and Rhythm: Normal rate.  Pulmonary:     Effort: Pulmonary effort is normal.  Abdominal:     Palpations: Abdomen is soft.  Genitourinary:    Comments: Cervix is well healed. Musculoskeletal:     Cervical back: Neck supple.  Skin:    General: Skin is warm and dry.  Neurological:     Mental Status: She is alert and oriented to person, place, and time.         Assessment & Plan:  S/P LEEP - healing well--f/u pap in 6 months.   Return in about 5 months (around 09/10/2023) for repeat pap.  Reva Bores, MD 04/10/2023 4:37 PM

## 2023-04-20 ENCOUNTER — Encounter: Payer: Self-pay | Admitting: Family Medicine

## 2023-05-16 DIAGNOSIS — H9201 Otalgia, right ear: Secondary | ICD-10-CM | POA: Diagnosis not present

## 2023-05-16 DIAGNOSIS — M26641 Arthritis of right temporomandibular joint: Secondary | ICD-10-CM | POA: Diagnosis not present

## 2023-05-18 ENCOUNTER — Encounter: Payer: Self-pay | Admitting: Family Medicine

## 2023-05-18 ENCOUNTER — Other Ambulatory Visit: Payer: Self-pay | Admitting: *Deleted

## 2023-05-18 MED ORDER — FLUCONAZOLE 150 MG PO TABS: 150.0000 mg | ORAL_TABLET | Freq: Once | ORAL | 3 refills | Status: AC

## 2023-07-31 ENCOUNTER — Encounter: Payer: Self-pay | Admitting: Family Medicine

## 2023-08-06 DIAGNOSIS — Z1331 Encounter for screening for depression: Secondary | ICD-10-CM | POA: Diagnosis not present

## 2023-08-06 DIAGNOSIS — F411 Generalized anxiety disorder: Secondary | ICD-10-CM | POA: Diagnosis not present

## 2023-08-06 DIAGNOSIS — M79604 Pain in right leg: Secondary | ICD-10-CM | POA: Diagnosis not present

## 2023-08-06 DIAGNOSIS — M25551 Pain in right hip: Secondary | ICD-10-CM | POA: Diagnosis not present

## 2023-08-06 DIAGNOSIS — R519 Headache, unspecified: Secondary | ICD-10-CM | POA: Diagnosis not present

## 2023-08-06 DIAGNOSIS — G8929 Other chronic pain: Secondary | ICD-10-CM | POA: Diagnosis not present

## 2023-08-06 DIAGNOSIS — Z0001 Encounter for general adult medical examination with abnormal findings: Secondary | ICD-10-CM | POA: Diagnosis not present

## 2023-08-06 DIAGNOSIS — Z1339 Encounter for screening examination for other mental health and behavioral disorders: Secondary | ICD-10-CM | POA: Diagnosis not present

## 2023-08-06 HISTORY — DX: Generalized anxiety disorder: F41.1

## 2023-08-07 ENCOUNTER — Other Ambulatory Visit: Payer: Self-pay | Admitting: Internal Medicine

## 2023-08-07 DIAGNOSIS — M79604 Pain in right leg: Secondary | ICD-10-CM

## 2023-08-14 ENCOUNTER — Ambulatory Visit
Admission: RE | Admit: 2023-08-14 | Discharge: 2023-08-14 | Disposition: A | Discharge status: Home / Self Care | Payer: 59 | Source: Ambulatory Visit | Attending: Internal Medicine | Admitting: Internal Medicine

## 2023-08-14 DIAGNOSIS — M79604 Pain in right leg: Secondary | ICD-10-CM | POA: Insufficient documentation

## 2023-09-05 ENCOUNTER — Encounter: Payer: Self-pay | Admitting: Family Medicine

## 2023-09-05 ENCOUNTER — Other Ambulatory Visit (HOSPITAL_COMMUNITY)
Admission: RE | Admit: 2023-09-05 | Discharge: 2023-09-05 | Disposition: A | Discharge status: Home / Self Care | Payer: 59 | Source: Ambulatory Visit | Attending: Family Medicine | Admitting: Family Medicine

## 2023-09-05 ENCOUNTER — Ambulatory Visit: Payer: 59 | Admitting: Family Medicine

## 2023-09-05 VITALS — BP 113/71 | HR 68 | Wt 141.0 lb

## 2023-09-05 DIAGNOSIS — Z01419 Encounter for gynecological examination (general) (routine) without abnormal findings: Secondary | ICD-10-CM | POA: Diagnosis not present

## 2023-09-05 DIAGNOSIS — D069 Carcinoma in situ of cervix, unspecified: Secondary | ICD-10-CM | POA: Diagnosis present

## 2023-09-05 DIAGNOSIS — Z1151 Encounter for screening for human papillomavirus (HPV): Secondary | ICD-10-CM | POA: Diagnosis not present

## 2023-09-05 DIAGNOSIS — Z87410 Personal history of cervical dysplasia: Secondary | ICD-10-CM | POA: Diagnosis not present

## 2023-09-05 NOTE — Progress Notes (Signed)
   Subjective:    Patient ID: Caroline [name] is a 27 y.o. female presenting with Abnormal Pap Smear  on 09/05/2023  HPI: Here for f/u pap. Previous CIN3 on LEEP with positive margins. Needs repeat pap @ 6 months. No new issues.  Review of Systems  Constitutional:  Negative for chills and fever.  Respiratory:  Negative for shortness of breath.   Cardiovascular:  Negative for chest pain.  Gastrointestinal:  Negative for abdominal pain, nausea and vomiting.  Genitourinary:  Negative for dysuria.  Skin:  Negative for rash.      Objective:    BP 113/71   Pulse 68   Wt 141 lb (64 kg)   LMP 08/15/2023 (Approximate)   BMI 25.79 kg/m  Physical Exam Exam conducted with a chaperone present.  Constitutional:      General: She is not in acute distress.    Appearance: She is well-developed.  HENT:     Head: Normocephalic and atraumatic.  Eyes:     General: No scleral icterus. Cardiovascular:     Rate and Rhythm: Normal rate.  Pulmonary:     Effort: Pulmonary effort is normal.  Abdominal:     Palpations: Abdomen is soft.  Genitourinary:    Comments: BUS normal, vagina is pink and rugated, cervix is parous without lesion, uterus is small and anteverted, no adnexal mass or tenderness.  Musculoskeletal:     Cervical back: Neck supple.  Skin:    General: Skin is warm and dry.  Neurological:     Mental Status: She is alert and oriented to person, place, and time.         Assessment & Plan:   Problem List Items Addressed This Visit       Unprioritized   High grade squamous intraepithelial lesion (HGSIL), grade 3 CIN, on biopsy of cervix - Primary    Repeat pap today, she is interested in hysterectomy if recurrence of HGSIL.       Relevant Orders   Cytology - PAP( [facility])    Return in about 6 months (around 03/05/2024) for repeat pap.  Reva Bores, MD 09/05/2023 10:12 AM  Her

## 2023-09-05 NOTE — Progress Notes (Signed)
RGYN pt here for repeat pap.  Last pap: 01/17/23 +HPV ASC-H Colpo: HSIL /CIN 3   LMP:09/18-09/23  Contraception: Condoms

## 2023-09-05 NOTE — Assessment & Plan Note (Signed)
Repeat pap today, she is interested in hysterectomy if recurrence of HGSIL.

## 2023-09-10 ENCOUNTER — Encounter: Payer: Self-pay | Admitting: Family Medicine

## 2023-09-11 LAB — CYTOLOGY - PAP
Comment: NEGATIVE
Diagnosis: NEGATIVE
High risk HPV: NEGATIVE

## 2023-09-24 ENCOUNTER — Encounter: Payer: Self-pay | Admitting: Family Medicine

## 2023-09-25 ENCOUNTER — Other Ambulatory Visit: Payer: Self-pay | Admitting: *Deleted

## 2023-09-25 MED ORDER — FLUCONAZOLE 150 MG PO TABS: 150.0000 mg | ORAL_TABLET | Freq: Once | ORAL | 3 refills | Status: AC

## 2023-10-31 ENCOUNTER — Ambulatory Visit: Payer: 59 | Admitting: Family Medicine

## 2023-10-31 ENCOUNTER — Encounter: Payer: Self-pay | Admitting: Family Medicine

## 2023-10-31 VITALS — BP 111/74 | HR 84 | Wt 139.6 lb

## 2023-10-31 DIAGNOSIS — D069 Carcinoma in situ of cervix, unspecified: Secondary | ICD-10-CM | POA: Diagnosis not present

## 2023-10-31 NOTE — Progress Notes (Signed)
CC: Surgical consult   Tubal vs Hysterectomy

## 2023-10-31 NOTE — Assessment & Plan Note (Signed)
Given this and desire for sterilization and abnormal menstrual cycles, will proceed with TVH. Risks include but are not limited to bleeding, infection, injury to surrounding structures, including bowel, bladder and ureters, blood clots, and death.  Likelihood of success is high.

## 2023-10-31 NOTE — Progress Notes (Signed)
   Subjective:    Patient ID: DEBRA GUIA is a 27 y.o. female presenting with surgical consult   on 10/31/2023  HPI: Here today to discuss permanent sterilization. She has h/o HGSIL with CIN3 with +ve margins on recent pathology. She prefers hysterectomy due to this. Her cycles have been somewhat irregular.  Review of Systems  Constitutional:  Negative for chills and fever.  Respiratory:  Negative for shortness of breath.   Cardiovascular:  Negative for chest pain.  Gastrointestinal:  Negative for abdominal pain, nausea and vomiting.  Genitourinary:  Negative for dysuria.  Skin:  Negative for rash.      Objective:    BP 111/74   Pulse 84   Wt 139 lb 9.6 oz (63.3 kg)   BMI 25.53 kg/m  Physical Exam Exam conducted with a chaperone present.  Constitutional:      General: She is not in acute distress.    Appearance: She is well-developed.  HENT:     Head: Normocephalic and atraumatic.  Eyes:     General: No scleral icterus. Cardiovascular:     Rate and Rhythm: Normal rate.  Pulmonary:     Effort: Pulmonary effort is normal.  Abdominal:     Palpations: Abdomen is soft.  Musculoskeletal:     Cervical back: Neck supple.  Skin:    General: Skin is warm and dry.  Neurological:     Mental Status: She is alert and oriented to person, place, and time.         Assessment & Plan:   Problem List Items Addressed This Visit       Unprioritized   High grade squamous intraepithelial lesion (HGSIL), grade 3 CIN, on biopsy of cervix - Primary    Given this and desire for sterilization and abnormal menstrual cycles, will proceed with TVH. Risks include but are not limited to bleeding, infection, injury to surrounding structures, including bowel, bladder and ureters, blood clots, and death.  Likelihood of success is high.      Relevant Orders   Ambulatory Referral For Surgery Scheduling    Return in about 2 months (around 01/01/2024) for postop check.  Reva Bores,  MD 10/31/2023 4:24 PM

## 2023-11-01 ENCOUNTER — Telehealth: Payer: Self-pay

## 2023-11-01 ENCOUNTER — Encounter: Payer: Self-pay | Admitting: *Deleted

## 2023-11-01 NOTE — Telephone Encounter (Signed)
Called patient to schedule surgery w/ Dr. Shawnie Pons. Left voicemail stating Dr. Tawni Levy first available is 11/13/23 @[facility] . Asked patient to call me back to schedule at [phone].

## 2023-12-06 ENCOUNTER — Encounter: Payer: Self-pay | Admitting: Family Medicine

## 2023-12-07 ENCOUNTER — Telehealth: Payer: Self-pay

## 2023-12-07 NOTE — Telephone Encounter (Signed)
 Called patient to schedule surgery w/ Dr. Shawnie Pons. Patient would like surgery scheduled 12/25/23 @[facility]  at 1:30 pm. Agreed to schedule patient and call her back with final details.

## 2023-12-13 ENCOUNTER — Encounter (HOSPITAL_BASED_OUTPATIENT_CLINIC_OR_DEPARTMENT_OTHER): Payer: Self-pay | Admitting: Family Medicine

## 2023-12-14 ENCOUNTER — Encounter (HOSPITAL_BASED_OUTPATIENT_CLINIC_OR_DEPARTMENT_OTHER): Payer: Self-pay | Admitting: Family Medicine

## 2023-12-14 DIAGNOSIS — N926 Irregular menstruation, unspecified: Secondary | ICD-10-CM | POA: Insufficient documentation

## 2023-12-14 HISTORY — DX: Irregular menstruation, unspecified: N92.6

## 2023-12-14 NOTE — Progress Notes (Signed)
Your procedure is scheduled on :  Tuesday,  12-25-2023  Report to [facility] [place] AT  _11:30__ AM.   Call this number if you have problems the morning of surgery  [phone]. Any questions prior to surgery call pre-op nurse,  Yaniah Thiemann :  [phone]    OUR ADDRESS IS [address].  WE ARE LOCATED IN [facility].  PLEASE BRING YOUR INSURANCE CARD AND PHOTO ID DAY OF SURGERY.                                     REMEMBER:  DO NOT EAT FOOD AFTER MIDNIGHT THE NIGHT BEFORE YOUR SURGERY . YOU MAY HAVE CLEAR LIQUIDS FROM MIDNIGHT THE NIGHT BEFORE YOUR SURGERY UNTIL  10: AM_. NO CLEAR LIQUIDS AFTER   _10:30 AM DAY OF SURGERY.  THIS INCLUDES NO WATER,  CANDY,  GUM,  AND  MINTS.  PLEASE BRUSH YOUR TEETH MORNING OF SURGERY AND RINSE YOUR MOUTH OUT     CLEAR LIQUID DIET  Allowed      Water                                                                   Coffee and tea, regular and decaf  (NO cream or milk products of any type, may sweeten,  no honey)                         Carbonated beverages, regular and diet                                    Sports drinks like Gatorade _____________________________________________________________________     TAKE ONLY THESE MEDICATIONS MORNING OF SURGERY:                                        DO NOT WEAR JEWERLY/  METAL/  PIERCINGS (INCLUDING NO PLASTIC PIERCINGS) DO NOT WEAR LOTIONS, POWDERS, PERFUMES OR NAIL POLISH ON YOUR FINGERNAILS. TOENAIL POLISH IS OK TO WEAR. DO NOT SHAVE FOR 48 HOURS PRIOR TO DAY OF SURGERY.  CONTACTS, GLASSES, OR DENTURES MAY NOT BE WORN TO SURGERY.  REMEMBER: NO SMOKING, VAPING ,  DRUGS OR ALCOHOL FOR 24 HOURS BEFORE YOUR SURGERY.                                    [facility] IS NOT RESPONSIBLE  FOR ANY BELONGINGS.                                                                    Marland Kitchen           [place] -  Preparing for Surgery Before surgery, you can play an important  role.  Because skin is not sterile, your skin needs to be as free of germs as possible.  You can reduce the number of germs on your skin by washing with CHG (chlorahexidine gluconate) soap before surgery.  CHG is an antiseptic cleaner which kills germs and bonds with the skin to continue killing germs even after washing. Please DO NOT use if you have an allergy to CHG or antibacterial soaps.  If your skin becomes reddened/irritated stop using the CHG and inform your nurse when you arrive at Short Stay. Do not shave (including legs and underarms) for at least 48 hours prior to the first CHG shower.  You may shave your face/neck. Please follow these instructions carefully:  1.  Shower with CHG Soap the night before surgery and the  morning of Surgery.  2.  If you choose to wash your hair, wash your hair first as usual with your  normal  shampoo.  3.  After you shampoo, rinse your hair and body thoroughly to remove the  shampoo.                                        4.  Use CHG as you would any other liquid soap.  You can apply chg directly  to the skin and wash , chg soap provided, night before and morning of your surgery.  5.  Apply the CHG Soap to your body ONLY FROM THE NECK DOWN.   Do not use on face/ open                           Wound or open sores. Avoid contact with eyes, ears mouth and genitals (private parts).                       Wash face,  Genitals (private parts) with your normal soap.             6.  Wash thoroughly, paying special attention to the area where your surgery  will be performed.  7.  Thoroughly rinse your body with warm water from the neck down.  8.  DO NOT shower/wash with your normal soap after using and rinsing off  the CHG Soap.             9.  Pat yourself dry with a clean towel.            10.  Wear clean pajamas.            11.  Place clean sheets on your bed the night of your first shower and do not  sleep with pets. Day of Surgery : Do not apply any lotions/  powders the morning of surgery.  Please wear clean clothes to the hospital/surgery center.  IF YOU HAVE ANY SKIN IRRITATION OR PROBLEMS WITH THE SURGICAL SOAP, PLEASE GET A BAR OF GOLD DIAL SOAP AND SHOWER THE NIGHT BEFORE YOUR SURGERY AND THE MORNING OF YOUR SURGERY. PLEASE LET THE NURSE KNOW MORNING OF YOUR SURGERY IF YOU HAD ANY PROBLEMS WITH THE SURGICAL SOAP.   YOUR SURGEON MAY HAVE REQUESTED EXTENDED RECOVERY TIME AFTER YOUR SURGERY. IT COULD BE A  JUST A FEW HOURS  UP TO AN OVERNIGHT STAY.  YOUR SURGEON SHOULD HAVE DISCUSSED THIS WITH  YOU PRIOR TO YOUR SURGERY. IN THE EVENT YOU NEED TO STAY OVERNIGHT PLEASE REFER TO THE FOLLOWING GUIDELINES. YOU MAY HAVE UP TO 4 VISITORS  MAY VISIT IN THE EXTENDED RECOVERY ROOM UNTIL 800 PM ONLY.  ONE  VISITOR AGE 74 AND OVER MAY SPEND THE NIGHT AND MUST BE IN EXTENDED RECOVERY ROOM NO LATER THAN 800 PM . YOUR DISCHARGE TIME AFTER YOU SPEND THE NIGHT IS 900 AM THE MORNING AFTER YOUR SURGERY. YOU MAY PACK A SMALL OVERNIGHT BAG WITH TOILETRIES FOR YOUR OVERNIGHT STAY IF YOU WISH.  REGARDLESS OF IF YOU STAY OVER NIGHT OR ARE DISCHARGED THE SAME DAY YOU WILL BE REQUIRED TO HAVE A RESPONSIBLE ADULT (18 YRS OLD OR OLDER) STAY WITH YOU FOR AT LEAST THE FIRST 46 HOURS WHEN HOME.  YOUR PRESCRIPTION MEDICATIONS WILL BE PROVIDED DURING [facility] STAY.  ________________________________________________________________________

## 2023-12-14 NOTE — H&P (Signed)
Caroline [name] is an 28 y.o. [phone] female.   Chief Complaint: abnormal pap and irregular cycles HPI: Patient with h/o AUB and irregular cycles. Also, recently with h/o CIN3 on LEEP with +ve margins, most recent pap WNL. Desires permanent sterilization and definitive treatment of precancerous cervical lesions. H/o C-section x 1 and VBAC x 1.  Past Medical History:  Diagnosis Date   Abnormal uterine bleeding (AUB)    CIN III (cervical intraepithelial neoplasia III) 01/2023   colpo 01-31-2023  CIN 3/  LEEP 02-28-2023/  pap/ bx 09-05-2023  HGSIL, CIN3   History of child affected by Down syndrome    History of gestational diabetes    Migraines    per pt does not take meds   Situational anxiety    12-14-2023  "anxious about being put to sleep for surgery"    Past Surgical History:  Procedure Laterality Date   CESAREAN SECTION N/A 08/11/2017   Procedure: CESAREAN SECTION;  Surgeon: [place] Bing, MD;  Location: [facility] BIRTHING SUITES;  Service: Obstetrics;  Laterality: N/A;   WISDOM TOOTH EXTRACTION Bilateral     Family History  Problem Relation Age of Onset   Kidney failure Mother    Drug abuse Mother    Heart disease Father    Drug abuse Maternal Grandmother    Heart disease Paternal Grandfather    Cancer Paternal Grandfather    Hypertension Paternal Grandfather    Lung cancer Paternal Grandfather    Diabetes Paternal Grandfather    Heart disease Other    Asthma Neg Hx    Birth defects Neg Hx    Social History:  reports that she has never smoked. She has been exposed to tobacco smoke. She has never used smokeless tobacco. She reports current alcohol use of about 7.0 - 14.0 standard drinks of alcohol per week. She reports that she does not use drugs.  Allergies:  Allergies  Allergen Reactions   Vicks Nyquil Cold & Flu Night [Dm-Apap-Cpm] Rash and Other (See Comments)    Red in face and hot    No medications prior to admission.    A comprehensive review of systems was  negative.  Height 5\' 2"  (1.575 m), weight 66.7 kg, last menstrual period 12/07/2023, not currently breastfeeding. Ht 5\' 2"  (1.575 m)   Wt 66.7 kg   LMP 12/07/2023 (Approximate)   BMI 26.89 kg/m  General appearance: alert, cooperative, and appears stated age Head: Normocephalic, without obvious abnormality, atraumatic Neck: no adenopathy, supple, symmetrical, trachea midline, and thyroid not enlarged, symmetric, no tenderness/mass/nodules Lungs:  normal effort Heart: regular rate and rhythm Abdomen: soft, non-tender; bowel sounds normal; no masses,  no organomegaly Extremities: Homans sign is negative, no sign of DVT Skin: Skin color, texture, turgor normal. No rashes or lesions Neurologic: Grossly normal   No results found for this or any previous visit (from the past 24 hours). No results found.  Assessment/Plan Active Problems:   High grade squamous intraepithelial lesion (HGSIL), grade 3 CIN, on biopsy of cervix   Irregular menstrual cycle  For TVH with possible bilateral salpingectomy. Will need continued vaginal surveillance of recurrence of HGSIL Risks include but are not limited to bleeding, infection, injury to surrounding structures, including bowel, bladder and ureters, blood clots, and death.  Likelihood of success is high.    Reva Bores 12/14/2023, 3:59 PM

## 2023-12-14 NOTE — Progress Notes (Signed)
Spoke w/ via phone for pre-op interview--- pt Lab needs dos----   urine preg      Lab results------ lab appt 12-17-2023 @ 1030 getting CBC/ T&S COVID test -----patient states asymptomatic no test needed Arrive at ------- 1130 on 12-25-2023 NPO after MN NO Solid Food.  Clear liquids from MN until--- 1030 Med rec completed Medications to take morning of surgery ----- none Diabetic medication ----- n/a Patient instructed no nail polish to be worn day of surgery Patient instructed to bring photo id and insurance card day of surgery Patient aware to have Driver (ride ) / caregiver    for 24 hours after surgery - husband, Caroline [name] Patient Special Instructions ----- will pick up back w/ hibiclens and written instructions at lab appt.  Asked to call with any questions Pre-Op special Instructions ----- n/a Patient verbalized understanding of instructions that were given at this phone interview. Patient denies chest pain, sob, fever, cough at the interview.

## 2023-12-17 ENCOUNTER — Encounter (HOSPITAL_COMMUNITY)
Admission: RE | Admit: 2023-12-17 | Discharge: 2023-12-17 | Disposition: A | Discharge status: Home / Self Care | Payer: 59 | Source: Ambulatory Visit | Attending: Family Medicine

## 2023-12-17 DIAGNOSIS — Z01812 Encounter for preprocedural laboratory examination: Secondary | ICD-10-CM | POA: Insufficient documentation

## 2023-12-17 DIAGNOSIS — D069 Carcinoma in situ of cervix, unspecified: Secondary | ICD-10-CM

## 2023-12-17 LAB — CBC
HCT: 46.9 % — ABNORMAL HIGH (ref 36.0–46.0)
Hemoglobin: 15.5 g/dL — ABNORMAL HIGH (ref 12.0–15.0)
MCH: 29.4 pg (ref 26.0–34.0)
MCHC: 33 g/dL (ref 30.0–36.0)
MCV: 89 fL (ref 80.0–100.0)
Platelets: 244 10*3/uL (ref 150–400)
RBC: 5.27 MIL/uL — ABNORMAL HIGH (ref 3.87–5.11)
RDW: 12.7 % (ref 11.5–15.5)
WBC: 6.6 10*3/uL (ref 4.0–10.5)
nRBC: 0 % (ref 0.0–0.2)

## 2023-12-25 ENCOUNTER — Other Ambulatory Visit: Payer: Self-pay

## 2023-12-25 ENCOUNTER — Ambulatory Visit (HOSPITAL_BASED_OUTPATIENT_CLINIC_OR_DEPARTMENT_OTHER): Payer: 59 | Admitting: Anesthesiology

## 2023-12-25 ENCOUNTER — Ambulatory Visit (HOSPITAL_BASED_OUTPATIENT_CLINIC_OR_DEPARTMENT_OTHER)
Admission: RE | Admit: 2023-12-25 | Discharge: 2023-12-26 | Disposition: A | Discharge status: Home / Self Care | Payer: 59 | Attending: Family Medicine | Admitting: Family Medicine

## 2023-12-25 ENCOUNTER — Encounter (HOSPITAL_BASED_OUTPATIENT_CLINIC_OR_DEPARTMENT_OTHER): Payer: Self-pay | Admitting: Family Medicine

## 2023-12-25 ENCOUNTER — Encounter (HOSPITAL_BASED_OUTPATIENT_CLINIC_OR_DEPARTMENT_OTHER)
Admission: RE | Disposition: A | Discharge status: Home / Self Care | Payer: Self-pay | Source: Home / Self Care | Attending: Family Medicine

## 2023-12-25 DIAGNOSIS — Z90710 Acquired absence of both cervix and uterus: Secondary | ICD-10-CM | POA: Diagnosis not present

## 2023-12-25 DIAGNOSIS — N888 Other specified noninflammatory disorders of cervix uteri: Secondary | ICD-10-CM | POA: Insufficient documentation

## 2023-12-25 DIAGNOSIS — N926 Irregular menstruation, unspecified: Secondary | ICD-10-CM | POA: Diagnosis not present

## 2023-12-25 DIAGNOSIS — N939 Abnormal uterine and vaginal bleeding, unspecified: Secondary | ICD-10-CM | POA: Diagnosis present

## 2023-12-25 DIAGNOSIS — Z8759 Personal history of other complications of pregnancy, childbirth and the puerperium: Secondary | ICD-10-CM | POA: Diagnosis not present

## 2023-12-25 DIAGNOSIS — D060 Carcinoma in situ of endocervix: Secondary | ICD-10-CM

## 2023-12-25 DIAGNOSIS — Z01818 Encounter for other preprocedural examination: Secondary | ICD-10-CM

## 2023-12-25 DIAGNOSIS — Z98891 History of uterine scar from previous surgery: Secondary | ICD-10-CM | POA: Diagnosis not present

## 2023-12-25 DIAGNOSIS — N938 Other specified abnormal uterine and vaginal bleeding: Secondary | ICD-10-CM

## 2023-12-25 DIAGNOSIS — D069 Carcinoma in situ of cervix, unspecified: Secondary | ICD-10-CM | POA: Diagnosis present

## 2023-12-25 HISTORY — DX: Personal history of gestational diabetes: Z86.32

## 2023-12-25 HISTORY — DX: Abnormal uterine and vaginal bleeding, unspecified: N93.9

## 2023-12-25 HISTORY — DX: Other specified anxiety disorders: F41.8

## 2023-12-25 HISTORY — PX: VAGINAL HYSTERECTOMY: SHX2639

## 2023-12-25 HISTORY — DX: Migraine, unspecified, not intractable, without status migrainosus: G43.909

## 2023-12-25 LAB — CBC
HCT: 40.9 % (ref 36.0–46.0)
Hemoglobin: 13.6 g/dL (ref 12.0–15.0)
MCH: 29.3 pg (ref 26.0–34.0)
MCHC: 33.3 g/dL (ref 30.0–36.0)
MCV: 88.1 fL (ref 80.0–100.0)
Platelets: 221 10*3/uL (ref 150–400)
RBC: 4.64 MIL/uL (ref 3.87–5.11)
RDW: 12.8 % (ref 11.5–15.5)
WBC: 13 10*3/uL — ABNORMAL HIGH (ref 4.0–10.5)
nRBC: 0 % (ref 0.0–0.2)

## 2023-12-25 LAB — TYPE AND SCREEN
ABO/RH(D): A POS
Antibody Screen: NEGATIVE

## 2023-12-25 LAB — CREATININE, SERUM
Creatinine, Ser: 0.72 mg/dL (ref 0.44–1.00)
GFR, Estimated: >60 mL/min (ref >60)

## 2023-12-25 LAB — POCT PREGNANCY, URINE: Preg Test, Ur: NEGATIVE

## 2023-12-25 SURGERY — HYSTERECTOMY, VAGINAL
Anesthesia: General | Site: Vagina

## 2023-12-25 MED ORDER — ROCURONIUM BROMIDE 10 MG/ML (PF) SYRINGE
PREFILLED_SYRINGE | INTRAVENOUS | Status: AC
  Filled 2023-12-25: qty 10

## 2023-12-25 MED ORDER — MENTHOL 3 MG MT LOZG: 1.0000 | LOZENGE | OROMUCOSAL | Status: DC | PRN

## 2023-12-25 MED ORDER — MEPERIDINE HCL 25 MG/ML IJ SOLN: 6.2500 mg | INTRAMUSCULAR | Status: DC | PRN

## 2023-12-25 MED ORDER — GABAPENTIN 100 MG PO CAPS
100.0000 mg | ORAL_CAPSULE | Freq: Two times a day (BID) | ORAL | Status: DC
  Administered 2023-12-26: 100 mg via ORAL

## 2023-12-25 MED ORDER — LACTATED RINGERS IV SOLN
INTRAVENOUS | Status: DC
  Administered 2023-12-25: 500 mL via INTRAVENOUS

## 2023-12-25 MED ORDER — SUGAMMADEX SODIUM 200 MG/2ML IV SOLN
INTRAVENOUS | Status: DC | PRN
  Administered 2023-12-25: 200 mg via INTRAVENOUS

## 2023-12-25 MED ORDER — STERILE WATER FOR IRRIGATION IR SOLN
Status: DC | PRN
  Administered 2023-12-25: 500 mL

## 2023-12-25 MED ORDER — LACTATED RINGERS IV BOLUS
500.0000 mL | Freq: Once | INTRAVENOUS | Status: AC
  Administered 2023-12-25: 500 mL via INTRAVENOUS

## 2023-12-25 MED ORDER — AMISULPRIDE (ANTIEMETIC) 5 MG/2ML IV SOLN
10.0000 mg | Freq: Once | INTRAVENOUS | Status: AC | PRN
  Administered 2023-12-25: 10 mg via INTRAVENOUS

## 2023-12-25 MED ORDER — OXYCODONE HCL 5 MG PO TABS
ORAL_TABLET | ORAL | Status: AC
  Filled 2023-12-25: qty 1

## 2023-12-25 MED ORDER — DEXAMETHASONE SODIUM PHOSPHATE 10 MG/ML IJ SOLN
INTRAMUSCULAR | Status: AC
  Filled 2023-12-25: qty 1

## 2023-12-25 MED ORDER — HYDROMORPHONE HCL 1 MG/ML IJ SOLN
0.2000 mg | INTRAMUSCULAR | Status: DC | PRN
  Administered 2023-12-25: 0.25 mg via INTRAVENOUS

## 2023-12-25 MED ORDER — KETOROLAC TROMETHAMINE 30 MG/ML IJ SOLN
30.0000 mg | Freq: Four times a day (QID) | INTRAMUSCULAR | Status: DC
  Administered 2023-12-25 – 2023-12-26 (×3): 30 mg via INTRAVENOUS

## 2023-12-25 MED ORDER — ACETAMINOPHEN 500 MG PO TABS
1000.0000 mg | ORAL_TABLET | ORAL | Status: AC
  Administered 2023-12-25: 1000 mg via ORAL

## 2023-12-25 MED ORDER — SOD CITRATE-CITRIC ACID 500-334 MG/5ML PO SOLN: 30.0000 mL | ORAL | Status: AC

## 2023-12-25 MED ORDER — ONDANSETRON HCL 4 MG PO TABS: 4.0000 mg | ORAL_TABLET | Freq: Four times a day (QID) | ORAL | Status: DC | PRN

## 2023-12-25 MED ORDER — AMISULPRIDE (ANTIEMETIC) 5 MG/2ML IV SOLN
INTRAVENOUS | Status: AC
  Filled 2023-12-25: qty 4

## 2023-12-25 MED ORDER — MIDAZOLAM HCL 2 MG/2ML IJ SOLN
INTRAMUSCULAR | Status: AC
  Filled 2023-12-25: qty 2

## 2023-12-25 MED ORDER — PHENYLEPHRINE 80 MCG/ML (10ML) SYRINGE FOR IV PUSH (FOR BLOOD PRESSURE SUPPORT)
PREFILLED_SYRINGE | INTRAVENOUS | Status: AC
  Filled 2023-12-25: qty 10

## 2023-12-25 MED ORDER — CEFAZOLIN SODIUM 1 G IJ SOLR
INTRAMUSCULAR | Status: AC
  Filled 2023-12-25: qty 20

## 2023-12-25 MED ORDER — LIDOCAINE 2% (20 MG/ML) 5 ML SYRINGE
INTRAMUSCULAR | Status: DC | PRN
  Administered 2023-12-25: 60 mg via INTRAVENOUS

## 2023-12-25 MED ORDER — OXYCODONE HCL 5 MG PO TABS: 5.0000 mg | ORAL_TABLET | Freq: Once | ORAL | Status: DC | PRN

## 2023-12-25 MED ORDER — SODIUM CHLORIDE 0.9% FLUSH
3.0000 mL | INTRAVENOUS | Status: DC | PRN
Start: 2023-12-25 — End: 2023-12-26

## 2023-12-25 MED ORDER — HYDROMORPHONE HCL 1 MG/ML IJ SOLN
INTRAMUSCULAR | Status: AC
  Filled 2023-12-25: qty 1

## 2023-12-25 MED ORDER — ENOXAPARIN SODIUM 40 MG/0.4ML IJ SOSY
40.0000 mg | PREFILLED_SYRINGE | INTRAMUSCULAR | Status: DC
  Administered 2023-12-26: 40 mg via SUBCUTANEOUS

## 2023-12-25 MED ORDER — OXYCODONE HCL 5 MG PO TABS: 5.0000 mg | ORAL_TABLET | Freq: Four times a day (QID) | ORAL | 0 refills | Status: DC | PRN

## 2023-12-25 MED ORDER — ONDANSETRON HCL 4 MG/2ML IJ SOLN
4.0000 mg | Freq: Four times a day (QID) | INTRAMUSCULAR | Status: DC | PRN
  Administered 2023-12-25: 4 mg via INTRAVENOUS

## 2023-12-25 MED ORDER — ONDANSETRON HCL 4 MG/2ML IJ SOLN
INTRAMUSCULAR | Status: DC | PRN
  Administered 2023-12-25: 4 mg via INTRAVENOUS

## 2023-12-25 MED ORDER — ACETAMINOPHEN 500 MG PO TABS
ORAL_TABLET | ORAL | Status: AC
  Filled 2023-12-25: qty 2

## 2023-12-25 MED ORDER — CEFAZOLIN SODIUM-DEXTROSE 2-3 GM-%(50ML) IV SOLR
INTRAVENOUS | Status: DC | PRN
  Administered 2023-12-25: 2 g via INTRAVENOUS

## 2023-12-25 MED ORDER — HYDROMORPHONE HCL 1 MG/ML IJ SOLN
0.2500 mg | INTRAMUSCULAR | Status: DC | PRN
  Administered 2023-12-25 (×3): 0.25 mg via INTRAVENOUS

## 2023-12-25 MED ORDER — KETOROLAC TROMETHAMINE 30 MG/ML IJ SOLN
INTRAMUSCULAR | Status: AC
  Filled 2023-12-25: qty 1

## 2023-12-25 MED ORDER — GABAPENTIN 300 MG PO CAPS
300.0000 mg | ORAL_CAPSULE | ORAL | Status: AC
  Administered 2023-12-25: 300 mg via ORAL

## 2023-12-25 MED ORDER — LIDOCAINE-EPINEPHRINE (PF) 1 %-1:200000 IJ SOLN
INTRAMUSCULAR | Status: DC | PRN
  Administered 2023-12-25: 20 mL

## 2023-12-25 MED ORDER — OXYCODONE HCL 5 MG/5ML PO SOLN: 5.0000 mg | Freq: Once | ORAL | Status: DC | PRN

## 2023-12-25 MED ORDER — LIDOCAINE HCL (PF) 2 % IJ SOLN
INTRAMUSCULAR | Status: AC
  Filled 2023-12-25: qty 5

## 2023-12-25 MED ORDER — SODIUM CHLORIDE 0.9 % IV SOLN: 12.5000 mg | INTRAVENOUS | Status: DC | PRN

## 2023-12-25 MED ORDER — ARTIFICIAL TEARS OPHTHALMIC OINT
TOPICAL_OINTMENT | OPHTHALMIC | Status: AC
  Filled 2023-12-25: qty 14

## 2023-12-25 MED ORDER — FENTANYL CITRATE (PF) 250 MCG/5ML IJ SOLN
INTRAMUSCULAR | Status: DC | PRN
  Administered 2023-12-25 (×5): 50 ug via INTRAVENOUS

## 2023-12-25 MED ORDER — DOCUSATE SODIUM 100 MG PO CAPS
100.0000 mg | ORAL_CAPSULE | Freq: Two times a day (BID) | ORAL | Status: DC
  Administered 2023-12-26: 100 mg via ORAL

## 2023-12-25 MED ORDER — PROPOFOL 10 MG/ML IV BOLUS
INTRAVENOUS | Status: DC | PRN
  Administered 2023-12-25: 130 mg via INTRAVENOUS

## 2023-12-25 MED ORDER — FENTANYL CITRATE (PF) 250 MCG/5ML IJ SOLN
INTRAMUSCULAR | Status: AC
  Filled 2023-12-25: qty 5

## 2023-12-25 MED ORDER — ONDANSETRON HCL 4 MG/2ML IJ SOLN
INTRAMUSCULAR | Status: AC
  Filled 2023-12-25: qty 2

## 2023-12-25 MED ORDER — CEFAZOLIN SODIUM-DEXTROSE 2-4 GM/100ML-% IV SOLN: 2.0000 g | INTRAVENOUS | Status: DC

## 2023-12-25 MED ORDER — LACTATED RINGERS IV SOLN: INTRAVENOUS | Status: DC

## 2023-12-25 MED ORDER — ROCURONIUM BROMIDE 10 MG/ML (PF) SYRINGE
PREFILLED_SYRINGE | INTRAVENOUS | Status: DC | PRN
  Administered 2023-12-25: 60 mg via INTRAVENOUS

## 2023-12-25 MED ORDER — POVIDONE-IODINE 10 % EX SWAB: 2.0000 | Freq: Once | CUTANEOUS | Status: DC

## 2023-12-25 MED ORDER — SODIUM CHLORIDE 0.9% FLUSH: 3.0000 mL | Freq: Two times a day (BID) | INTRAVENOUS | Status: DC

## 2023-12-25 MED ORDER — KETOROLAC TROMETHAMINE 30 MG/ML IJ SOLN
INTRAMUSCULAR | Status: DC | PRN
  Administered 2023-12-25: 30 mg via INTRAVENOUS

## 2023-12-25 MED ORDER — ACETAMINOPHEN 500 MG PO TABS
1000.0000 mg | ORAL_TABLET | Freq: Four times a day (QID) | ORAL | Status: DC
  Administered 2023-12-25 – 2023-12-26 (×3): 1000 mg via ORAL

## 2023-12-25 MED ORDER — PROPOFOL 10 MG/ML IV BOLUS
INTRAVENOUS | Status: AC
  Filled 2023-12-25: qty 20

## 2023-12-25 MED ORDER — ONDANSETRON HCL 4 MG/2ML IJ SOLN
INTRAMUSCULAR | Status: AC
  Filled 2023-12-25: qty 4

## 2023-12-25 MED ORDER — GABAPENTIN 300 MG PO CAPS
ORAL_CAPSULE | ORAL | Status: AC
  Filled 2023-12-25: qty 1

## 2023-12-25 MED ORDER — TRANEXAMIC ACID-NACL 1000-0.7 MG/100ML-% IV SOLN
INTRAVENOUS | Status: AC
  Filled 2023-12-25: qty 100

## 2023-12-25 MED ORDER — OXYCODONE HCL 5 MG PO TABS
5.0000 mg | ORAL_TABLET | ORAL | Status: DC | PRN
  Administered 2023-12-25 – 2023-12-26 (×3): 5 mg via ORAL

## 2023-12-25 MED ORDER — IBUPROFEN 200 MG PO TABS: 600.0000 mg | ORAL_TABLET | Freq: Four times a day (QID) | ORAL | Status: DC

## 2023-12-25 MED ORDER — TRANEXAMIC ACID-NACL 1000-0.7 MG/100ML-% IV SOLN
1000.0000 mg | Freq: Once | INTRAVENOUS | Status: AC
  Administered 2023-12-25: 1000 mg via INTRAVENOUS

## 2023-12-25 MED ORDER — DEXAMETHASONE SODIUM PHOSPHATE 10 MG/ML IJ SOLN
INTRAMUSCULAR | Status: DC | PRN
  Administered 2023-12-25: 10 mg via INTRAVENOUS

## 2023-12-25 MED ORDER — SODIUM CHLORIDE 0.9 % IV SOLN
25.0000 mg | Freq: Once | INTRAVENOUS | Status: AC
  Administered 2023-12-25: 25 mg via INTRAVENOUS
  Filled 2023-12-25: qty 1

## 2023-12-25 MED ORDER — MIDAZOLAM HCL 2 MG/2ML IJ SOLN
INTRAMUSCULAR | Status: DC | PRN
  Administered 2023-12-25: 2 mg via INTRAVENOUS

## 2023-12-25 MED ORDER — BISACODYL 10 MG RE SUPP: 10.0000 mg | Freq: Every day | RECTAL | Status: DC | PRN

## 2023-12-25 SURGICAL SUPPLY — 25 items
GAUZE 4X4 16PLY ~~LOC~~+RFID DBL (SPONGE) ×2 IMPLANT
GAUZE STRIP PACKING 2INX5YD (MISCELLANEOUS) IMPLANT
GLOVE BIOGEL PI IND STRL 6.5 (GLOVE) ×1 IMPLANT
GLOVE BIOGEL PI IND STRL 7.0 (GLOVE) ×2 IMPLANT
GLOVE ECLIPSE 6.5 STRL STRAW (GLOVE) ×1 IMPLANT
GLOVE ECLIPSE 7.0 STRL STRAW (GLOVE) ×2 IMPLANT
GOWN STRL REUS W/TWL XL LVL3 (GOWN DISPOSABLE) ×5 IMPLANT
HOLDER FOLEY CATH W/STRAP (MISCELLANEOUS) ×1 IMPLANT
KIT TURNOVER CYSTO (KITS) ×1 IMPLANT
NDL SPNL 18GX3.5 QUINCKE PK (NEEDLE) ×1 IMPLANT
NEEDLE SPNL 18GX3.5 QUINCKE PK (NEEDLE) ×1 IMPLANT
NS IRRIG 1000ML POUR BTL (IV SOLUTION) ×1 IMPLANT
PACK VAGINAL WOMENS (CUSTOM PROCEDURE TRAY) ×1 IMPLANT
PAD OB MATERNITY 4.3X12.25 (PERSONAL CARE ITEMS) ×1 IMPLANT
SLEEVE SCD COMPRESS KNEE MED (STOCKING) ×1 IMPLANT
SPIKE FLUID TRANSFER (MISCELLANEOUS) IMPLANT
SPONGE T-LAP 4X18 ~~LOC~~+RFID (SPONGE) ×1 IMPLANT
SUT VIC AB 0 CT1 18XCR BRD8 (SUTURE) ×3 IMPLANT
SUT VIC AB 0 CT1 27XBRD ANBCTR (SUTURE) ×2 IMPLANT
SUT VICRYL 0 TIES 12 18 (SUTURE) ×1 IMPLANT
SYR 20ML LL LF (SYRINGE) ×1 IMPLANT
TOWEL OR 17X24 6PK STRL BLUE (TOWEL DISPOSABLE) ×1 IMPLANT
TRAY FOLEY W/BAG SLVR 14FR LF (SET/KITS/TRAYS/PACK) ×1 IMPLANT
UNDERPAD 30X36 HEAVY ABSORB (UNDERPADS AND DIAPERS) ×1 IMPLANT
WATER STERILE IRR 500ML POUR (IV SOLUTION) IMPLANT

## 2023-12-25 NOTE — Op Note (Signed)
Preoperative diagnosis: abnormal pap and irregular cycles  Postoperative diagnosis: Same  Procedure: Transvaginal hysterectomy and bilateral salpingectomy  Surgeon: Shelbie Proctor. Shawnie Pons, M.D.  Assistant: Rivka Barbara, MD An experienced assistant was required given the standard of surgical care given the complexity of the case.  This assistant was needed for exposure, dissection, suctioning, retraction, instrument exchange, and for overall help during the procedure.  Anesthesia: General ETT-,Hollis, Kaylyn Layer, MD, MD  Findings: cervix with prior leep, normal appearing uterus, tubes, ovaries.  Estimated blood loss: 100 cc  Specimen: Uterus to pathology  Reason for procedure: Patient had h/o abnromal pap and irregular cycles.  Had  previous LEEP, and desired definitive treament. The patient desired definitive treatment.  Risks of  hysterectomy reviewed.  Risks include but are not limited to bleeding, infection, injury to surrounding structures, including bowel, bladder and ureters, blood clots, and death.  Likelihood of success of surgery is high.   Procedure: Patient was taken to the OR where she was placed in dorsal lithotomy in Allen stirrups. She was prepped and draped in the usual sterile fashion. A timeout was performed. The patient received 2 g of Ancef prior to procedure. The patient had SCDs in place.  A speculum was placed inside the vagina. The cervix was visualized and grasped with 2 doublle-tooth tenacula. 20 cc of 1% lidocaine with epinephrine were injected paracervically. A knife was used to make a circumferential incision around the vagina. An opened sponge was used to dissect the vagina off the cervix. The posterior peritoneum was entered sharply with Mayo scissors. The posterior peritoneum was tagged to the vaginal cuff with a single stitch. Careful dissection of the bladder off the underlying cervix and pushed up A Heaney clamp was used to clamp first the left uterosacral ligament and  cardinal which was then cut and Haney suture ligated with 0 Vicryl stitch, the stitch was held. Similarly the right uterosacral ligament was clamped cut and suture ligated. Sequential bites up the broad to the uterine arteries were taken until the tubo-ovarian pedicles were encountered. The uterus was then inverted and the left utero-ovarian pedicle grasped with a Heaney clamp. The right utero-ovarian pedicle was similarly grasped with the Heaney clamp. The right left ovaries were easily visualized. The left  tube were grasped with Babcock clamp and a Kelly clamp placed behind this. Tube  removed and t ligated with a free tie. The right tube was similarly grasped with a Babcock clamp and a Kelly clamp used to clamp behind this. The tube was removed on the side and ligated with a free tie. Inspection of all pedicles revealed adequate hemostasis. An allis was used to grab the anterior vaginal cuff and a pinpoint hole in the bladder was made. A figure of eight was used to close this. The vagina was closed with 0 Vicryl suture in a locked running fashion with care taken to incorporate the uterosacral pedicles. Excellent hemostasis was noted at the end of the case. The vaginal cuff was inspected there was minimal bleeding noted.  A Foley catheter is placed inside her bladder. Clear, yellow urine was noted. All instrument needle and lap counts were correct x 2. Patient was awakened taken to recovery room in stable condition.  Reva Bores, MD 12/25/2023, 2:47 PM

## 2023-12-25 NOTE — Anesthesia Preprocedure Evaluation (Signed)
Anesthesia Evaluation  Patient identified by MRN, date of birth, ID band Patient awake    Reviewed: Allergy & Precautions, H&P , NPO status , Patient's Chart, lab work & pertinent test results  Airway Mallampati: I       Dental no notable dental hx.    Pulmonary neg pulmonary ROS   Pulmonary exam normal        Cardiovascular negative cardio ROS Normal cardiovascular exam     Neuro/Psych  Headaches  Anxiety      negative psych ROS   GI/Hepatic negative GI ROS, Neg liver ROS,,,  Endo/Other  negative endocrine ROS    Renal/GU negative Renal ROS  negative genitourinary   Musculoskeletal negative musculoskeletal ROS (+)    Abdominal  (+) + obese  Peds  Hematology negative hematology ROS (+)   Anesthesia Other Findings   Reproductive/Obstetrics Placenta previa                             Anesthesia Physical Anesthesia Plan  ASA: II  Anesthesia Plan: General   Post-op Pain Management: Tylenol PO (pre-op)* and Gabapentin PO (pre-op)*   Induction:   PONV Risk Score and Plan: 3 and Ondansetron, Dexamethasone, Midazolam and Treatment may vary due to age or medical condition  Airway Management Planned: Oral ETT  Additional Equipment:   Intra-op Plan:   Post-operative Plan: Extubation in OR  Informed Consent: I have reviewed the patients History and Physical, chart, labs and discussed the procedure including the risks, benefits and alternatives for the proposed anesthesia with the patient or authorized representative who has indicated his/her understanding and acceptance.       Plan Discussed with:   Anesthesia Plan Comments:         Anesthesia Quick Evaluation

## 2023-12-25 NOTE — Anesthesia Procedure Notes (Signed)
Procedure Name: Intubation Date/Time: 12/25/2023 1:47 PM  Performed by: Dairl Ponder, CRNAPre-anesthesia Checklist: Patient identified, Emergency Drugs available, Suction available and Patient being monitored Patient Re-evaluated:Patient Re-evaluated prior to induction Oxygen Delivery Method: Circle System Utilized Preoxygenation: Pre-oxygenation with 100% oxygen Induction Type: IV induction Ventilation: Mask ventilation without difficulty Laryngoscope Size: Mac and 3 Grade View: Grade I Tube type: Oral Tube size: 7.0 mm Number of attempts: 1 Airway Equipment and Method: Stylet and Oral airway Placement Confirmation: ETT inserted through vocal cords under direct vision, positive ETCO2 and breath sounds checked- equal and bilateral Secured at: 21 cm Tube secured with: Tape Dental Injury: Teeth and Oropharynx as per pre-operative assessment

## 2023-12-25 NOTE — Interval H&P Note (Signed)
History and Physical Interval Note:  12/25/2023 11:07 AM  Caroline [name]  has presented today for surgery, with the diagnosis of Abnormal uterine bleeding  3 CIN.  The various methods of treatment have been discussed with the patient and family. After consideration of risks, benefits and other options for treatment, the patient has consented to  Procedure(s): HYSTERECTOMY VAGINAL (N/A) as a surgical intervention.  The patient's history has been reviewed, patient examined, no change in status, stable for surgery.  I have reviewed the patient's chart and labs.  Questions were answered to the patient's satisfaction.     Reva Bores

## 2023-12-25 NOTE — Progress Notes (Signed)
Dr Shawnie Pons notified of pt's n/v, pt's vaginal pressure and decreased UOP.  Orders rec'd.  Pt getting IV bolus presently w/ Phenergan to follow.  VSS.  Pt unable to ambulate tonight d/t nausea when moving.  Will attempt to ambulate pt later.  Will continue to monitor pt

## 2023-12-25 NOTE — Transfer of Care (Signed)
Immediate Anesthesia Transfer of Care Note  Patient: Caroline [name]  Procedure(s) Performed: HYSTERECTOMY VAGINAL (Vagina )  Patient Location: PACU  Anesthesia Type:General  Level of Consciousness: awake, alert , and oriented  Airway & Oxygen Therapy: Patient Spontanous Breathing and Patient connected to nasal cannula oxygen  Post-op Assessment: Report given to RN and Post -op Vital signs reviewed and stable  Post vital signs: Reviewed and stable  Last Vitals:  Vitals Value Taken Time  BP 131/100 12/25/23 1500  Temp    Pulse 102 12/25/23 1500  Resp 14 12/25/23 1500  SpO2 100 % 12/25/23 1500  Vitals shown include unfiled device data.  Last Pain:  Vitals:   12/25/23 1139  TempSrc: Oral  PainSc: 0-No pain      Patients Stated Pain Goal: 6 (12/25/23 1139)  Complications: No notable events documented.

## 2023-12-26 ENCOUNTER — Encounter (HOSPITAL_BASED_OUTPATIENT_CLINIC_OR_DEPARTMENT_OTHER): Payer: Self-pay | Admitting: Family Medicine

## 2023-12-26 DIAGNOSIS — N926 Irregular menstruation, unspecified: Secondary | ICD-10-CM | POA: Diagnosis not present

## 2023-12-26 LAB — CBC
HCT: 36.3 % (ref 36.0–46.0)
Hemoglobin: 12.4 g/dL (ref 12.0–15.0)
MCH: 29.6 pg (ref 26.0–34.0)
MCHC: 34.2 g/dL (ref 30.0–36.0)
MCV: 86.6 fL (ref 80.0–100.0)
Platelets: 214 10*3/uL (ref 150–400)
RBC: 4.19 MIL/uL (ref 3.87–5.11)
RDW: 12.6 % (ref 11.5–15.5)
WBC: 8.2 10*3/uL (ref 4.0–10.5)
nRBC: 0 % (ref 0.0–0.2)

## 2023-12-26 MED ORDER — OXYCODONE HCL 5 MG PO TABS
ORAL_TABLET | ORAL | Status: AC
  Filled 2023-12-26: qty 1

## 2023-12-26 MED ORDER — ACETAMINOPHEN 500 MG PO TABS
ORAL_TABLET | ORAL | Status: AC
  Filled 2023-12-26: qty 2

## 2023-12-26 MED ORDER — ENOXAPARIN SODIUM 40 MG/0.4ML IJ SOSY
PREFILLED_SYRINGE | INTRAMUSCULAR | Status: AC
  Filled 2023-12-26: qty 0.4

## 2023-12-26 MED ORDER — OXYCODONE HCL 5 MG PO TABS: 5.0000 mg | ORAL_TABLET | Freq: Four times a day (QID) | ORAL | 0 refills | Status: DC | PRN

## 2023-12-26 MED ORDER — KETOROLAC TROMETHAMINE 30 MG/ML IJ SOLN
INTRAMUSCULAR | Status: AC
  Filled 2023-12-26: qty 1

## 2023-12-26 MED ORDER — GABAPENTIN 100 MG PO CAPS
ORAL_CAPSULE | ORAL | Status: AC
  Filled 2023-12-26: qty 1

## 2023-12-26 MED ORDER — DOCUSATE SODIUM 100 MG PO CAPS
ORAL_CAPSULE | ORAL | Status: AC
  Filled 2023-12-26: qty 1

## 2023-12-26 MED ORDER — PROMETHAZINE HCL 25 MG PO TABS: 25.0000 mg | ORAL_TABLET | Freq: Four times a day (QID) | ORAL | 0 refills | Status: DC | PRN

## 2023-12-26 NOTE — Discharge Summary (Signed)
Physician Discharge Summary  Patient ID: Caroline [name] MRN: [id number] DOB/AGE: [birth year] 28 y.o.  Admit date: 12/25/2023 Discharge date: No discharge date for patient encounter.    Admission Diagnoses:  Principal Problem:   Status post hysterectomy Active Problems:   High grade squamous intraepithelial lesion (HGSIL), grade 3 CIN, on biopsy of cervix   Irregular menstrual cycle   Discharge Diagnoses:  Same  Past Medical History:  Diagnosis Date   Abnormal uterine bleeding (AUB)    CIN III (cervical intraepithelial neoplasia III) 01/2023   colpo 01-31-2023  CIN 3/  LEEP 02-28-2023/  pap/ bx 09-05-2023  HGSIL, CIN3   History of child affected by Down syndrome    History of gestational diabetes    Migraines    per pt does not take meds   Situational anxiety    12-14-2023  "anxious about being put to sleep for surgery"    Surgeries: Procedure(s): HYSTERECTOMY VAGINAL on 12/25/2023   Consultants: None  Discharged Condition: Improved  Hospital Course: Caroline [name] is an 28 y.o. female [id number] who was admitted 12/25/2023 with a chief complaint of abnormal pap and irregular cycles. , and found to have a diagnosis of Status post hysterectomy.  They were brought to the operating room on 12/25/2023 and underwent the above named procedures.    She was given perioperative antibiotics:  Anti-infectives (From admission, onward)    Start     Dose/Rate Route Frequency Ordered Stop   12/25/23 1355  ceFAZolin (ANCEF) IVPB 2g/100 mL premix        2 g 200 mL/hr over 30 Minutes Intravenous STAT 12/25/23 1355 12/26/23 1400     .   She was given sequential compression devices, early ambulation, and chemoprophylaxis for DVT prophylaxis. Postoperatively, she had some decreased UOP and nausea with movement. This improved with IV fluid bolus and IV phenergan. POD#1 she was ambulating and tolerating po. She is discharged with a leg bag x 1 week. Her labs are stable  postoperatively.   She benefited maximally from their hospital stay and there were no complications. She was ambulating, voiding, tolerating po and deemed stable for discharge.  Recent vital signs:  Vitals:   12/26/23 0105 12/26/23 0459  BP: 116/67 107/68  Pulse: 78 67  Resp: 16 14  Temp: 98.7 F (37.1 C) 98.4 F (36.9 C)  SpO2: 96% 97%   Discharge exam: Physical Examination: General appearance - alert, well appearing, and in no distress Chest - normal effort Heart - normal rate and regular rhythm Abdomen - soft, appropriately tender, dressing is clean and dry Extremities - peripheral pulses normal, no pedal edema, no clubbing or cyanosis, Homan's sign negative bilaterally Recent laboratory studies:  Results for orders placed or performed during the hospital encounter of 12/25/23  Pregnancy, urine POC   Collection Time: 12/25/23 11:08 AM  Result Value Ref Range   Preg Test, Ur NEGATIVE NEGATIVE  CBC   Collection Time: 12/25/23  7:02 PM  Result Value Ref Range   WBC 13.0 (H) 4.0 - 10.5 K/uL   RBC 4.64 3.87 - 5.11 MIL/uL   Hemoglobin 13.6 12.0 - 15.0 g/dL   HCT 13.0 86.5 - 78.4 %   MCV 88.1 80.0 - 100.0 fL   MCH 29.3 26.0 - 34.0 pg   MCHC 33.3 30.0 - 36.0 g/dL   RDW 69.6 29.5 - 28.4 %   Platelets 221 150 - 400 K/uL   nRBC 0.0 0.0 - 0.2 %  Creatinine, serum   Collection  Time: 12/25/23  7:02 PM  Result Value Ref Range   Creatinine, Ser 0.72 0.44 - 1.00 mg/dL   GFR, Estimated >86 >57 mL/min  CBC   Collection Time: 12/26/23  3:23 AM  Result Value Ref Range   WBC 8.2 4.0 - 10.5 K/uL   RBC 4.19 3.87 - 5.11 MIL/uL   Hemoglobin 12.4 12.0 - 15.0 g/dL   HCT 84.6 96.2 - 95.2 %   MCV 86.6 80.0 - 100.0 fL   MCH 29.6 26.0 - 34.0 pg   MCHC 34.2 30.0 - 36.0 g/dL   RDW 84.1 32.4 - 40.1 %   Platelets 214 150 - 400 K/uL   nRBC 0.0 0.0 - 0.2 %    Discharge Medications:   Allergies as of 12/26/2023       Reactions   Vicks Nyquil Cold & Flu Night [dm-apap-cpm] Rash, Other (See  Comments)   Red in face and hot        Medication List     TAKE these medications    docusate sodium 100 MG capsule Commonly known as: COLACE Take 100 mg by mouth daily.   oxyCODONE 5 MG immediate release tablet Commonly known as: Oxy IR/ROXICODONE Take 1 tablet (5 mg total) by mouth every 6 (six) hours as needed for severe pain (pain score 7-10).   promethazine 25 MG tablet Commonly known as: PHENERGAN Take 1 tablet (25 mg total) by mouth every 6 (six) hours as needed for nausea.        Diagnostic Studies: No results found.  Disposition: Discharge disposition: 01-Home or Self Care       Discharge Instructions      Remove dressing in 72 hours   Complete by: As directed    Call MD for:  persistant nausea and vomiting   Complete by: As directed    Call MD for:  redness, tenderness, or signs of infection (pain, swelling, redness, odor or green/yellow discharge around incision site)   Complete by: As directed    Call MD for:  severe uncontrolled pain   Complete by: As directed    Call MD for:  temperature >100.4   Complete by: As directed    Diet - low sodium heart healthy   Complete by: As directed    Discharge patient   Complete by: As directed    Discharge disposition: 01-Home or Self Care   Discharge patient date: 12/26/2023   Driving Restrictions   Complete by: As directed    None while taking narcotic pain meds   Increase activity slowly   Complete by: As directed    Lifting restrictions   Complete by: As directed    Nothing > 20 lbs x 6 weeks   Sexual Activity Restrictions   Complete by: As directed    None x 6 weeks        Follow-up Information     [facility] Follow up in 1 week(s).   Specialty: Obstetrics and Gynecology Why: they will call you with an appointment, postop check Contact information: [address] [place] [address] [phone]                  Signed: Reva Bores 12/26/2023, 7:17 AM

## 2023-12-26 NOTE — Anesthesia Postprocedure Evaluation (Signed)
Anesthesia Post Note  Patient: Caroline [name]  Procedure(s) Performed: HYSTERECTOMY VAGINAL (Vagina )     Patient location during evaluation: PACU Anesthesia Type: General Level of consciousness: awake and alert Pain management: pain level controlled Vital Signs Assessment: post-procedure vital signs reviewed and stable Respiratory status: spontaneous breathing, nonlabored ventilation, respiratory function stable and patient connected to nasal cannula oxygen Cardiovascular status: blood pressure returned to baseline and stable Postop Assessment: no apparent nausea or vomiting Anesthetic complications: no   No notable events documented.               Shelton Silvas

## 2023-12-27 ENCOUNTER — Encounter: Payer: Self-pay | Admitting: Family Medicine

## 2023-12-27 LAB — SURGICAL PATHOLOGY

## 2023-12-30 ENCOUNTER — Encounter: Payer: Self-pay | Admitting: Family Medicine

## 2023-12-31 ENCOUNTER — Ambulatory Visit: Payer: 59 | Admitting: *Deleted

## 2023-12-31 ENCOUNTER — Emergency Department: Payer: 59

## 2023-12-31 ENCOUNTER — Observation Stay
Admission: EM | Admit: 2023-12-31 | Discharge: 2024-01-02 | Disposition: A | Discharge status: Home / Self Care | Payer: 59 | Attending: Obstetrics and Gynecology | Admitting: Obstetrics and Gynecology

## 2023-12-31 ENCOUNTER — Other Ambulatory Visit: Payer: Self-pay

## 2023-12-31 DIAGNOSIS — D72829 Elevated white blood cell count, unspecified: Secondary | ICD-10-CM | POA: Diagnosis not present

## 2023-12-31 DIAGNOSIS — A419 Sepsis, unspecified organism: Secondary | ICD-10-CM | POA: Diagnosis not present

## 2023-12-31 DIAGNOSIS — G8918 Other acute postprocedural pain: Principal | ICD-10-CM | POA: Insufficient documentation

## 2023-12-31 DIAGNOSIS — R7401 Elevation of levels of liver transaminase levels: Secondary | ICD-10-CM | POA: Diagnosis not present

## 2023-12-31 DIAGNOSIS — R1110 Vomiting, unspecified: Secondary | ICD-10-CM | POA: Diagnosis not present

## 2023-12-31 DIAGNOSIS — R338 Other retention of urine: Secondary | ICD-10-CM | POA: Diagnosis present

## 2023-12-31 DIAGNOSIS — E872 Acidosis, unspecified: Secondary | ICD-10-CM | POA: Diagnosis present

## 2023-12-31 DIAGNOSIS — R32 Unspecified urinary incontinence: Secondary | ICD-10-CM | POA: Insufficient documentation

## 2023-12-31 DIAGNOSIS — Z90710 Acquired absence of both cervix and uterus: Secondary | ICD-10-CM

## 2023-12-31 DIAGNOSIS — T85631A Leakage of intraperitoneal dialysis catheter, initial encounter: Principal | ICD-10-CM | POA: Insufficient documentation

## 2023-12-31 DIAGNOSIS — D649 Anemia, unspecified: Secondary | ICD-10-CM | POA: Insufficient documentation

## 2023-12-31 DIAGNOSIS — N9989 Other postprocedural complications and disorders of genitourinary system: Secondary | ICD-10-CM | POA: Diagnosis present

## 2023-12-31 DIAGNOSIS — N9981 Other intraoperative complications of genitourinary system: Secondary | ICD-10-CM | POA: Diagnosis present

## 2023-12-31 DIAGNOSIS — Z7722 Contact with and (suspected) exposure to environmental tobacco smoke (acute) (chronic): Secondary | ICD-10-CM | POA: Insufficient documentation

## 2023-12-31 LAB — COMPREHENSIVE METABOLIC PANEL
ALT: 76 U/L — ABNORMAL HIGH (ref 0–44)
AST: 47 U/L — ABNORMAL HIGH (ref 15–41)
Albumin: 4.5 g/dL (ref 3.5–5.0)
Alkaline Phosphatase: 177 U/L — ABNORMAL HIGH (ref 38–126)
Anion gap: 16 — ABNORMAL HIGH (ref 5–15)
BUN: 14 mg/dL (ref 6–20)
CO2: 19 mmol/L — ABNORMAL LOW (ref 22–32)
Calcium: 9 mg/dL (ref 8.9–10.3)
Chloride: 98 mmol/L (ref 98–111)
Creatinine, Ser: 0.94 mg/dL (ref 0.44–1.00)
GFR, Estimated: >60 mL/min (ref >60)
Glucose, Bld: 238 mg/dL — ABNORMAL HIGH (ref 70–99)
Potassium: 3.9 mmol/L (ref 3.5–5.1)
Sodium: 133 mmol/L — ABNORMAL LOW (ref 135–145)
Total Bilirubin: 1 mg/dL (ref 0.0–1.2)
Total Protein: 8.4 g/dL — ABNORMAL HIGH (ref 6.5–8.1)

## 2023-12-31 LAB — CBC WITH DIFFERENTIAL/PLATELET
Abs Immature Granulocytes: 0.2 10*3/uL — ABNORMAL HIGH (ref 0.00–0.07)
Basophils Absolute: 0.1 10*3/uL (ref 0.0–0.1)
Basophils Relative: 0 %
Eosinophils Absolute: 0.1 10*3/uL (ref 0.0–0.5)
Eosinophils Relative: 0 %
HCT: 43.6 % (ref 36.0–46.0)
Hemoglobin: 14.6 g/dL (ref 12.0–15.0)
Immature Granulocytes: 1 %
Lymphocytes Relative: 16 %
Lymphs Abs: 3.5 10*3/uL (ref 0.7–4.0)
MCH: 28.9 pg (ref 26.0–34.0)
MCHC: 33.5 g/dL (ref 30.0–36.0)
MCV: 86.2 fL (ref 80.0–100.0)
Monocytes Absolute: 1 10*3/uL (ref 0.1–1.0)
Monocytes Relative: 4 %
Neutro Abs: 17.5 10*3/uL — ABNORMAL HIGH (ref 1.7–7.7)
Neutrophils Relative %: 79 %
Platelets: 426 10*3/uL — ABNORMAL HIGH (ref 150–400)
RBC: 5.06 MIL/uL (ref 3.87–5.11)
RDW: 12.6 % (ref 11.5–15.5)
WBC: 22.3 10*3/uL — ABNORMAL HIGH (ref 4.0–10.5)
nRBC: 0 % (ref 0.0–0.2)

## 2023-12-31 LAB — LIPASE, BLOOD: Lipase: 27 U/L (ref 11–51)

## 2023-12-31 LAB — LACTIC ACID, PLASMA: Lactic Acid, Venous: 3.7 mmol/L (ref 0.5–1.9)

## 2023-12-31 MED ORDER — LACTATED RINGERS IV BOLUS (SEPSIS)
1000.0000 mL | Freq: Once | INTRAVENOUS | Status: AC
  Administered 2024-01-01: 1000 mL via INTRAVENOUS

## 2023-12-31 MED ORDER — OXYCODONE-ACETAMINOPHEN 5-325 MG PO TABS
1.0000 | ORAL_TABLET | Freq: Once | ORAL | Status: AC
  Administered 2023-12-31: 1 via ORAL
  Filled 2023-12-31: qty 1

## 2023-12-31 MED ORDER — HYDROMORPHONE HCL 1 MG/ML IJ SOLN
1.0000 mg | Freq: Once | INTRAMUSCULAR | Status: AC
  Administered 2024-01-01: 1 mg via INTRAVENOUS
  Filled 2023-12-31: qty 1

## 2023-12-31 MED ORDER — ONDANSETRON HCL 4 MG/2ML IJ SOLN
4.0000 mg | Freq: Once | INTRAMUSCULAR | Status: AC
  Administered 2023-12-31: 4 mg via INTRAVENOUS
  Filled 2023-12-31: qty 2

## 2023-12-31 MED ORDER — PIPERACILLIN-TAZOBACTAM 3.375 G IVPB 30 MIN
3.3750 g | Freq: Once | INTRAVENOUS | Status: AC
  Administered 2024-01-01: 3.375 g via INTRAVENOUS
  Filled 2023-12-31 (×2): qty 50

## 2023-12-31 MED ORDER — LACTATED RINGERS IV SOLN: INTRAVENOUS | Status: DC

## 2023-12-31 MED ORDER — IOHEXOL 300 MG/ML  SOLN
100.0000 mL | Freq: Once | INTRAMUSCULAR | Status: AC | PRN
  Administered 2024-01-01: 100 mL via INTRAVENOUS

## 2023-12-31 NOTE — Progress Notes (Signed)
Pt here for foley removal.   Pt was able to void 125cc urine about 30 min after foley was removed.   Pt to follow up as needed and or at Post op visit.   Scheryl Marten, RN

## 2023-12-31 NOTE — ED Provider Notes (Incomplete)
[facility] Provider Note    Event Date/Time   First MD Initiated Contact with Patient 12/31/23 2334     (approximate)   History   Abdominal Pain and Post-op Problem   HPI  Caroline [name] is a 28 y.o. female with history of CIN-3 status post vaginal hysterectomy, bilateral salpingectomy by Dr. Shawnie Pons on 12/25/2023 who presents to the emergency department with complaints of lower abdominal pain that started today.  Complains of nausea, vomiting.  No diarrhea.  No fevers or chills.  No chest pain, shortness of breath or cough.  Significant other reports that patient's bladder was "nicked" during surgery and she left with a Foley catheter that was removed yesterday afternoon.  States that she was able to spontaneously void about 4-5 times but states she has not been able to urinate on her own for about 5 to 6 hours now.  She denies any vaginal bleeding, discharge.   History provided by patient, significant other.    Past Medical History:  Diagnosis Date   Abnormal uterine bleeding (AUB)    Anxiety, generalized 08/06/2023   CIN III (cervical intraepithelial neoplasia III) 01/2023   colpo 01-31-2023  CIN 3/  LEEP 02-28-2023/  pap/ bx 09-05-2023  HGSIL, CIN3   Headache disorder 09/04/2018   History of child affected by Down syndrome    History of gestational diabetes    Hypertriglyceridemia 07/20/2021   Irregular menstrual cycle 12/14/2023   Migraines    per pt does not take meds   Situational anxiety    12-14-2023  "anxious about being put to sleep for surgery"    Past Surgical History:  Procedure Laterality Date   CESAREAN SECTION N/A 08/11/2017   Procedure: CESAREAN SECTION;  Surgeon: Las Ochenta Bing, MD;  Location: [facility];  Service: Obstetrics;  Laterality: N/A;   VAGINAL HYSTERECTOMY N/A 12/25/2023   Procedure: HYSTERECTOMY VAGINAL;  Surgeon: Reva Bores, MD;  Location: [facility];  Service: Gynecology;  Laterality:  N/A;   WISDOM TOOTH EXTRACTION Bilateral     MEDICATIONS:  Prior to Admission medications   Medication Sig Start Date End Date Taking? Authorizing Provider  docusate sodium (COLACE) 100 MG capsule Take 100 mg by mouth daily.    [provider]  oxyCODONE (OXY IR/ROXICODONE) 5 MG immediate release tablet Take 1 tablet (5 mg total) by mouth every 6 (six) hours as needed for severe pain (pain score 7-10). 12/26/23   Reva Bores, MD  promethazine (PHENERGAN) 25 MG tablet Take 1 tablet (25 mg total) by mouth every 6 (six) hours as needed for nausea. 12/26/23   Reva Bores, MD    Physical Exam   Triage Vital Signs: ED Triage Vitals  Encounter Vitals Group     BP 12/31/23 2221 132/88     Systolic BP Percentile --      Diastolic BP Percentile --      Pulse Rate 12/31/23 2221 92     Resp 12/31/23 2221 (!) 26     Temp 12/31/23 2231 (!) 97.5 F (36.4 C)     Temp Source 12/31/23 2231 Oral     SpO2 12/31/23 2221 100 %     Weight 12/31/23 2221 141 lb 1.5 oz (64 kg)     Height 12/31/23 2221 5\' 2"  (1.575 m)     Head Circumference --      Peak Flow --      Pain Score 12/31/23 2221 10  Pain Loc --      Pain Education --      Exclude from Growth Chart --     Most recent vital signs: Vitals:   01/01/24 0723 01/01/24 0731  BP: (!) 102/59 99/61  Pulse: 72 73  Resp: 14   Temp:    SpO2: 100% 100%    CONSTITUTIONAL: Alert, responds appropriately to questions.  Appears extremely uncomfortable, crying out in pain HEAD: Normocephalic, atraumatic EYES: Conjunctivae clear, pupils appear equal, sclera nonicteric ENT: normal nose; moist mucous membranes NECK: Supple, normal ROM CARD: Regular and tachycardic; S1 and S2 appreciated RESP: Patient is slightly tachypneic, breath sounds clear and equal bilaterally; no wheezes, no rhonchi, no rales, no hypoxia or respiratory distress, speaking full sentences ABD/GI: Non-distended; soft, tender diffusely throughout the lower abdomen  without guarding, rebound BACK: The back appears normal EXT: Normal ROM in all joints; no deformity noted, no edema SKIN: Normal color for age and race; warm; no rash on exposed skin NEURO: Moves all extremities equally, normal speech PSYCH: The patient's mood and manner are appropriate.   ED Results / Procedures / Treatments   LABS: (all labs ordered are listed, but only abnormal results are displayed) Labs Reviewed  CBC WITH DIFFERENTIAL/PLATELET - Abnormal; Notable for the following components:      Result Value   WBC 22.3 (*)    Platelets 426 (*)    Neutro Abs 17.5 (*)    Abs Immature Granulocytes 0.20 (*)    All other components within normal limits  COMPREHENSIVE METABOLIC PANEL - Abnormal; Notable for the following components:   Sodium 133 (*)    CO2 19 (*)    Glucose, Bld 238 (*)    Total Protein 8.4 (*)    AST 47 (*)    ALT 76 (*)    Alkaline Phosphatase 177 (*)    Anion gap 16 (*)    All other components within normal limits  URINALYSIS, ROUTINE W REFLEX MICROSCOPIC - Abnormal; Notable for the following components:   Color, Urine YELLOW (*)    APPearance CLEAR (*)    Glucose, UA >=500 (*)    Hgb urine dipstick LARGE (*)    Protein, ur 30 (*)    Bacteria, UA RARE (*)    All other components within normal limits  LACTIC ACID, PLASMA - Abnormal; Notable for the following components:   Lactic Acid, Venous 3.7 (*)    All other components within normal limits  LACTIC ACID, PLASMA - Abnormal; Notable for the following components:   Lactic Acid, Venous 3.0 (*)    All other components within normal limits  LACTIC ACID, PLASMA - Abnormal; Notable for the following components:   Lactic Acid, Venous 2.9 (*)    All other components within normal limits  CULTURE, BLOOD (SINGLE)  CULTURE, BLOOD (SINGLE)  URINE CULTURE  LIPASE, BLOOD  PROCALCITONIN  LACTIC ACID, PLASMA  LACTIC ACID, PLASMA  LACTIC ACID, PLASMA     EKG:   RADIOLOGY: My personal review and  interpretation of imaging: CT abdomen pelvis shows fluid within the pelvis.  Normal-appearing appendix.  Chest x-ray clear.  I have personally reviewed all radiology reports.   CT CYSTOGRAM ABD/PELVIS Result Date: 01/01/2024 CLINICAL DATA:  29 year old female status post recent hysterectomy. Nonspecific small volume of free fluid near the liver and spleen on routine CT Abdomen and Pelvis earlier today. Follow-up CT cystogram. EXAM: CT CYSTOGRAM (CT ABDOMEN AND PELVIS WITH CONTRAST) TECHNIQUE: Multi-detector CT imaging through the abdomen and  pelvis was performed after dilute contrast had been introduced into the bladder for the purposes of performing CT cystography. RADIATION DOSE REDUCTION: This exam was performed according to the departmental dose-optimization program which includes automated exposure control, adjustment of the mA and/or kV according to patient size and/or use of iterative reconstruction technique. CONTRAST:  OMNIPAQUE IOHEXOL 300 MG/ML  SOLN COMPARISON:  CT Abdomen and Pelvis 0008 hours today. FINDINGS: Lower chest: Lower lung volumes with symmetric mild dependent atelectasis. No pericardial or pleural effusion. Hepatobiliary: Small volume of perihepatic fluid not significantly changed. Otherwise stable and negative liver and gallbladder. Pancreas: Negative. Spleen: Less low-density perisplenic fluid now, read distributed from the earlier CT. Otherwise stable and negative spleen. Adrenals/Urinary Tract: Normal adrenal glands. Symmetric renal contrast enhancement and contrast excretion to nondilated ureters. Intermittent left ureter contrast visible into the pelvis. Delayed renal images also demonstrate appropriate, symmetric renal contrast excretion. CT cystogram was performed with indwelling Foley catheter. Good bladder contrast opacification. And on both initial and delayed images there is a small posterior midline bladder leak of contrast demonstrated (series 2, image 75 and series 7,  image 63) with contrast tracking from the leak site posteriorly into the cul-de-sac. Initial layering contrast within the cul-de-sac on series 2, image 69 is mildly increased in volume on the delayed images (series 7, image 58). Stomach/Bowel: Stable nondilated large bowel. Normal gas containing appendix redemonstrated on series 2, image 64. No free intraperitoneal air is identified and the punctate focus of gas seen in the lower small bowel mesentery earlier today is no longer identified. Vascular/Lymphatic: Major arterial structures appear patent and normal. Normal caliber abdominal aorta. Portal venous system is patent. Lower abdominal and pelvic central venous structures also appear patent. No lymphadenopathy identified. Reproductive: Stable parametrial postoperative changes. Other: Previously demonstrated pelvic free fluid now partially opacified (series 2, image 69) as detailed above. Musculoskeletal: Stable.  No acute osseous abnormality identified. IMPRESSION: 1. Cystogram is Positive for Bladder Leak/Tear located posteriorly near the midline on series 2, image 75. Small but progressive volume of extravasated intravesical contrast into the cul-de-sac on the delayed images. 2. No associated obstructive uropathy. Unremarkable kidneys and visible ureters. 3. And otherwise stable postoperative appearance of the abdomen and pelvis. Electronically Signed   By: Odessa Fleming M.D.   On: 01/01/2024 05:08   DG Chest Port 1 View Result Date: 01/01/2024 CLINICAL DATA:  Sepsis EXAM: PORTABLE CHEST 1 VIEW COMPARISON:  None Available. FINDINGS: The heart size and mediastinal contours are within normal limits. Both lungs are clear. The visualized skeletal structures are unremarkable. IMPRESSION: No active disease. Electronically Signed   By: Deatra Robinson M.D.   On: 01/01/2024 00:39   CT ABDOMEN PELVIS W CONTRAST Result Date: 01/01/2024 CLINICAL DATA:  Right lower quadrant pain recent partial hysterectomy, pain after Foley  removed EXAM: CT ABDOMEN AND PELVIS WITH CONTRAST TECHNIQUE: Multidetector CT imaging of the abdomen and pelvis was performed using the standard protocol following bolus administration of intravenous contrast. RADIATION DOSE REDUCTION: This exam was performed according to the departmental dose-optimization program which includes automated exposure control, adjustment of the mA and/or kV according to patient size and/or use of iterative reconstruction technique. CONTRAST:  OMNIPAQUE IOHEXOL 300 MG/ML  SOLN COMPARISON:  None Available. FINDINGS: Lower chest: Lung bases are clear Hepatobiliary: No focal liver abnormality is seen. No gallstones, gallbladder wall thickening, or biliary dilatation. Pancreas: Unremarkable. No pancreatic ductal dilatation or surrounding inflammatory changes. Spleen: Normal in size without focal abnormality. Adrenals/Urinary Tract:  Adrenal glands are normal. Kidneys show no hydronephrosis. Normal excretion of contrast. Catheter within the bladder which is decompressed. Only small volume of excreted contrast within the bladder on delayed views. Stomach/Bowel: Stomach nonenlarged. No dilated small bowel. No acute bowel wall thickening. Negative appendix Vascular/Lymphatic: No significant vascular findings are present. No enlarged abdominal or pelvic lymph nodes. Reproductive: Status post hysterectomy.  No suspicious adnexal mass Other: Small focus of gas in the mesentery, series 2, image 47 likely postoperative gas. Small volume free fluid near the liver and spleen with small moderate fluid in the pelvis, water density Musculoskeletal: No acute or significant osseous findings. IMPRESSION: 1. Status post hysterectomy. Small focus of gas in the mesentery likely postoperative. Small volume free fluid near the liver and spleen with small to moderate fluid in the pelvis. This is indeterminate for postoperative fluid or urine ascites given clinical history. Foley catheter is present in  decompressed urinary bladder, no frank extravasation of contrast on delayed views but only minimal contrast in the bladder, follow-up CT cystogram could be obtained if deemed clinically appropriate. Electronically Signed   By: Jasmine Pang M.D.   On: 01/01/2024 00:31     PROCEDURES:  Critical Care performed: Yes, see critical care procedure note(s)   CRITICAL CARE Performed by: Baxter Hire Tariah Transue   Total critical care time: 45 minutes  Critical care time was exclusive of separately billable procedures and treating other patients.  Critical care was necessary to treat or prevent imminent or life-threatening deterioration.  Critical care was time spent personally by me on the following activities: development of treatment plan with patient and/or surrogate as well as nursing, discussions with consultants, evaluation of patient's response to treatment, examination of patient, obtaining history from patient or surrogate, ordering and performing treatments and interventions, ordering and review of laboratory studies, ordering and review of radiographic studies, pulse oximetry and re-evaluation of patient's condition.   Marland Kitchen1-3 Lead EKG Interpretation  Performed by: Emmerson Shuffield, Layla Maw, DO Authorized by: Heavenlee Maiorana, Layla Maw, DO     Interpretation: abnormal     ECG rate:  110   ECG rate assessment: tachycardic     Rhythm: sinus tachycardia     Ectopy: none     Conduction: normal       IMPRESSION / MDM / ASSESSMENT AND PLAN / ED COURSE  I reviewed the triage vital signs and the nursing notes.    Patient here with complaints of severe lower abdominal pain, unable to urinate for several hours.  Patient underwent vaginal hysterectomy 1 week ago in [place].  The patient is on the cardiac monitor to evaluate for evidence of arrhythmia and/or significant heart rate changes.   DIFFERENTIAL DIAGNOSIS (includes but not limited to):   Urinary retention, UTI, appendicitis, bowel perforation, bowel  obstruction, ileus, colitis, diverticulitis, vaginal cuff dehiscence   Patient's presentation is most consistent with acute presentation with potential threat to life or bodily function.   PLAN: Workup initiated from triage.  Patient has a leukocytosis of 22,000 with left shift.  Lactic of 3.7.  Slightly elevated liver function test but normal total bilirubin and she has not complaining of any upper abdominal pain.  She does have a metabolic acidosis which is likely secondary to her elevated lactic.  I am concerned for sepsis given these lab abnormalities.  She is receiving IV fluids and IV Zosyn for concerns for an intra-abdominal source.  Unfortunately we were not able to use the bladder scan to accurately identify her bladder due to  her level of discomfort.  I did use the bedside ultrasound to look at her bladder and it appeared full to me so we decided to place a Foley catheter however she only put out about 100 mL of yellow appearing urine.  Decision made to leave the Foley catheter in place given recent bladder injury.  Will obtain CT of the abdomen pelvis.  Will give pain and nausea medicine.   MEDICATIONS GIVEN IN ED: Medications  lactated ringers infusion ( Intravenous New Bag/Given 01/01/24 0729)  HYDROmorphone (DILAUDID) injection 1 mg (1 mg Intravenous Given 01/01/24 0749)  ondansetron (ZOFRAN) injection 4 mg (has no administration in time range)  piperacillin-tazobactam (ZOSYN) IVPB 3.375 g (0 g Intravenous Stopped 01/01/24 0726)  oxyCODONE-acetaminophen (PERCOCET/ROXICET) 5-325 MG per tablet 1 tablet (1 tablet Oral Given 12/31/23 2233)  ondansetron (ZOFRAN) injection 4 mg (4 mg Intravenous Given 12/31/23 2243)  iohexol (OMNIPAQUE) 300 MG/ML solution 100 mL (100 mLs Intravenous Contrast Given 01/01/24 0002)  ondansetron (ZOFRAN) injection 4 mg (4 mg Intravenous Given 12/31/23 2354)  HYDROmorphone (DILAUDID) injection 1 mg (1 mg Intravenous Given 01/01/24 0004)  piperacillin-tazobactam (ZOSYN) IVPB  3.375 g (0 g Intravenous Stopped 01/01/24 0124)  lactated ringers bolus 1,000 mL (0 mLs Intravenous Stopped 01/01/24 0124)    And  lactated ringers bolus 1,000 mL (0 mLs Intravenous Stopped 01/01/24 0124)  lactated ringers bolus 1,000 mL (0 mLs Intravenous Stopped 01/01/24 0136)  HYDROmorphone (DILAUDID) injection 1 mg (1 mg Intravenous Given 01/01/24 0145)  iohexol (OMNIPAQUE) 300 MG/ML solution 100 mL (100 mLs Intravenous Contrast Given 01/01/24 0332)  ondansetron (ZOFRAN) injection 4 mg (4 mg Intravenous Given 01/01/24 0303)  lactated ringers bolus 1,000 mL (1,000 mLs Intravenous New Bag/Given 01/01/24 0715)  metoCLOPramide (REGLAN) injection 10 mg (10 mg Intravenous Given 01/01/24 0715)     ED COURSE: CT abdomen pelvis reviewed and interpreted by myself and the radiologist and shows gas in the mesentery and small to moderate fluid in the pelvis.  This could be postoperative in nature or due to urine ascites.  Discussed these findings with patient.  She will likely need a CT cystogram.  Patient states she would like to go back to [facility] where Dr. Shawnie Pons is located.  Will discuss with [facility] for potential transfer.  Patient continues to be hemodynamically stable and pain has improved after Dilaudid.  Lactic is downtrending.   Discussed case with Dr. Macon Large with OB/GYN in [place].  Appreciate her help.  She agrees to accept patient for admission to either [facility] or [facility] wherever there is a bed available.  We did discuss given significant admission holds in the ED at all 3 of these hospitals and no inpatient beds currently available that patient may wait in the ER for some time.  Given she is currently hemodynamically stable, I do not feel she warrants emergent ED to ED transfer.  We will continue to closely monitor here and give IV fluids, antibiotics, pain and nausea medicine and keep her n.p.o.  OB/GYN request that GYN here and [place] assessed the patient in consultation as well as  urology.   Discussed with Dr. Christeen Douglas with OB/GYN here at [facility]  She will see the patient in consultation in the morning.  Appreciate her help.   Discussed the case also with Dr. Vanna Scotland with urology here at [facility].  She agrees with proceeding with a CT cystogram.  She will also see patient in consultation if bladder leak present.   CT cystogram reviewed and  interpreted by myself and the radiologist and does show a bladder leak.  Updated Dr. Apolinar Junes with urology.  Given no beds available at [facility], [facility] per [facility], Dr. Apolinar Junes will see patient.  May take to OR versus manage conservatively.   Patient's lactic had downtrended to 1.9 but then unfortunately went back back up to 2.9.  Will give additional IV fluid bolus.   Patient continues to vomit per nursing staff despite Zofran.  Will give IV Reglan.   7:45 AM  Pt now wanting admission at [facility].  Providers updated.  Dr. Anner Crete in ED aware of patient as well.   CONSULTS: Consulted Dr. Macon Large at [facility], Dr. Dalbert Garnet and Dr. Apolinar Junes at [facility]   OUTSIDE RECORDS REVIEWED: Reviewed recent surgical notes.       FINAL CLINICAL IMPRESSION(S) / ED DIAGNOSES   Final diagnoses:  Post-operative pain  Acute sepsis (HCC)  Bladder leak  Vomiting in adult     Rx / DC Orders   ED Discharge Orders     None        Note:  This document was prepared using Dragon voice recognition software and may include unintentional dictation errors.         Noor Vidales, Layla Maw, DO 01/01/24 [phone]

## 2023-12-31 NOTE — ED Triage Notes (Signed)
Pt arrives via POV with CC of abd pain after having foley cath removed today. Pt reports partial hysterectomy on 1/28 with complication of "nicked bladder". Pt appears to be in significant pain.

## 2023-12-31 NOTE — Progress Notes (Signed)
Patient was assessed and managed by nursing staff during this encounter. I have reviewed the chart and agree with the documentation and plan. I have also made any necessary editorial changes.  Lennart Pall, MD 12/31/2023 5:10 PM

## 2024-01-01 ENCOUNTER — Inpatient Hospital Stay (HOSPITAL_COMMUNITY): Admit: 2024-01-01 | Payer: 59

## 2024-01-01 ENCOUNTER — Encounter (HOSPITAL_COMMUNITY): Payer: Self-pay

## 2024-01-01 ENCOUNTER — Emergency Department: Payer: 59

## 2024-01-01 ENCOUNTER — Encounter: Payer: Self-pay | Admitting: Family Medicine

## 2024-01-01 DIAGNOSIS — N9981 Other intraoperative complications of genitourinary system: Secondary | ICD-10-CM | POA: Diagnosis not present

## 2024-01-01 DIAGNOSIS — R338 Other retention of urine: Secondary | ICD-10-CM

## 2024-01-01 DIAGNOSIS — A419 Sepsis, unspecified organism: Secondary | ICD-10-CM | POA: Diagnosis not present

## 2024-01-01 DIAGNOSIS — N9989 Other postprocedural complications and disorders of genitourinary system: Secondary | ICD-10-CM | POA: Diagnosis not present

## 2024-01-01 DIAGNOSIS — Z90710 Acquired absence of both cervix and uterus: Secondary | ICD-10-CM

## 2024-01-01 DIAGNOSIS — T85631A Leakage of intraperitoneal dialysis catheter, initial encounter: Secondary | ICD-10-CM | POA: Diagnosis not present

## 2024-01-01 DIAGNOSIS — E872 Acidosis, unspecified: Secondary | ICD-10-CM | POA: Diagnosis present

## 2024-01-01 DIAGNOSIS — R7401 Elevation of levels of liver transaminase levels: Secondary | ICD-10-CM | POA: Diagnosis present

## 2024-01-01 LAB — URINALYSIS, ROUTINE W REFLEX MICROSCOPIC
Bilirubin Urine: NEGATIVE
Glucose, UA: >=500 mg/dL — AB
Ketones, ur: NEGATIVE mg/dL
Leukocytes,Ua: NEGATIVE
Nitrite: NEGATIVE
Protein, ur: 30 mg/dL — AB
RBC / HPF: >50 RBC/hpf (ref 0–5)
Specific Gravity, Urine: 1.012 (ref 1.005–1.030)
pH: 5 (ref 5.0–8.0)

## 2024-01-01 LAB — LACTIC ACID, PLASMA
Lactic Acid, Venous: 3 mmol/L (ref 0.5–1.9)
Lactic Acid, Venous: 1.9 mmol/L (ref 0.5–1.9)
Lactic Acid, Venous: 2.9 mmol/L (ref 0.5–1.9)
Lactic Acid, Venous: 2.7 mmol/L (ref 0.5–1.9)
Lactic Acid, Venous: 1.8 mmol/L (ref 0.5–1.9)

## 2024-01-01 LAB — PROCALCITONIN: Procalcitonin: <0.1 ng/mL

## 2024-01-01 MED ORDER — OXYCODONE HCL 5 MG PO TABS
5.0000 mg | ORAL_TABLET | ORAL | Status: DC | PRN
  Administered 2024-01-01: 5 mg via ORAL
  Filled 2024-01-01: qty 1

## 2024-01-01 MED ORDER — ONDANSETRON HCL 4 MG/2ML IJ SOLN: 4.0000 mg | Freq: Four times a day (QID) | INTRAMUSCULAR | Status: DC | PRN

## 2024-01-01 MED ORDER — LACTATED RINGERS IV BOLUS
1000.0000 mL | Freq: Once | INTRAVENOUS | Status: AC
  Administered 2024-01-01: 1000 mL via INTRAVENOUS

## 2024-01-01 MED ORDER — ONDANSETRON HCL 4 MG PO TABS: 4.0000 mg | ORAL_TABLET | Freq: Four times a day (QID) | ORAL | Status: DC | PRN

## 2024-01-01 MED ORDER — METOCLOPRAMIDE HCL 5 MG/ML IJ SOLN
10.0000 mg | Freq: Once | INTRAMUSCULAR | Status: AC
  Administered 2024-01-01: 10 mg via INTRAVENOUS
  Filled 2024-01-01: qty 2

## 2024-01-01 MED ORDER — LACTATED RINGERS IV BOLUS
1000.0000 mL | Freq: Once | INTRAVENOUS | Status: AC
Start: 2024-01-01 — End: 2024-01-01
  Administered 2024-01-01: 1000 mL via INTRAVENOUS

## 2024-01-01 MED ORDER — HYDROMORPHONE HCL 1 MG/ML IJ SOLN
1.0000 mg | Freq: Once | INTRAMUSCULAR | Status: AC
  Administered 2024-01-01: 1 mg via INTRAVENOUS
  Filled 2024-01-01: qty 1

## 2024-01-01 MED ORDER — IOHEXOL 300 MG/ML  SOLN
100.0000 mL | Freq: Once | INTRAMUSCULAR | Status: AC | PRN
  Administered 2024-01-01: 100 mL via INTRAVENOUS

## 2024-01-01 MED ORDER — HYDROMORPHONE HCL 1 MG/ML IJ SOLN
1.0000 mg | INTRAMUSCULAR | Status: DC | PRN
  Administered 2024-01-01 (×2): 1 mg via INTRAVENOUS
  Filled 2024-01-01 (×2): qty 1

## 2024-01-01 MED ORDER — PIPERACILLIN-TAZOBACTAM 3.375 G IVPB
3.3750 g | Freq: Three times a day (TID) | INTRAVENOUS | Status: DC
  Administered 2024-01-01 – 2024-01-02 (×4): 3.375 g via INTRAVENOUS
  Filled 2024-01-01 (×5): qty 50

## 2024-01-01 MED ORDER — ONDANSETRON HCL 4 MG/2ML IJ SOLN
4.0000 mg | Freq: Once | INTRAMUSCULAR | Status: AC
  Administered 2024-01-01: 4 mg via INTRAVENOUS
  Filled 2024-01-01: qty 2

## 2024-01-01 MED ORDER — SODIUM CHLORIDE 0.9 % IV SOLN: INTRAVENOUS | Status: DC

## 2024-01-01 MED ORDER — PIPERACILLIN-TAZOBACTAM 3.375 G IVPB 30 MIN: 3.3750 g | Freq: Four times a day (QID) | INTRAVENOUS | Status: DC

## 2024-01-01 NOTE — ED Notes (Signed)
Called [facility] spoke with Asher Muir no beds available at this time

## 2024-01-01 NOTE — Consult Note (Signed)
 Urology Consult  I have been asked to see the patient by Dr. Neomi, for evaluation and management of cystotomy.  Chief Complaint: Abdominal pain, decreased urine output  History of Present Illness: Caroline [name] is a 28 y.o. year old female status post transvaginal hysterectomy with bilateral salpingectomy on 12/25/2023 by Dr. Fredirick which was complicated by small cystotomy repaired intraoperatively.  Foley catheter was maintained for a week and removed yesterday.  Within several hours of Foley catheter removal, she began to experience severe lower abdominal pain, nausea/ vomiting, cold chills but no fevers and decreased urine output.  She noted that she felt the need to void but then went to the bathroom and was unable to do so.  She presented to the emergency room.  Bladder scan indicated for a higher volume but upon Foley catheter placement, but only ~200 cc cc of return.  She did experience some relief with Foley catheter replacement.  In the emergency room, she met sepsis criteria.  She has a lactate as high as 3.7, leukocytosis of 22.3 and slightly elevated AST ALT.  Her creatinine was 0.94.  Her procalcitonin as low, less than <.10  CT scan showed moderate fluid in the pelvis and bladder perforation/ongoing leak cannot be excluded.  A cystogram did confirm a very small volume of contrast extravasation posteriorly in the midline which collected in the cul-de-sac.  She continues to have some nausea and vomiting.  Her abdominal pain is greatly improved.  She was very anxious about having surgery and prefers to avoid any more intervention.  Her primary goal is to never experienced the pain that she experienced that brought her into the emergency room.  Past Medical History:  Diagnosis Date   Abnormal uterine bleeding (AUB)    Anxiety, generalized 08/06/2023   CIN III (cervical intraepithelial neoplasia III) 01/2023   colpo 01-31-2023  CIN 3/  LEEP 02-28-2023/  pap/ bx 09-05-2023   HGSIL, CIN3   Headache disorder 09/04/2018   History of child affected by Down syndrome    History of gestational diabetes    Hypertriglyceridemia 07/20/2021   Irregular menstrual cycle 12/14/2023   Migraines    per pt does not take meds   Situational anxiety    12-14-2023  anxious about being put to sleep for surgery    Past Surgical History:  Procedure Laterality Date   CESAREAN SECTION N/A 08/11/2017   Procedure: CESAREAN SECTION;  Surgeon: Izell Harari, MD;  Location: [facility];  Service: Obstetrics;  Laterality: N/A;   VAGINAL HYSTERECTOMY N/A 12/25/2023   Procedure: HYSTERECTOMY VAGINAL;  Surgeon: Fredirick Glenys RAMAN, MD;  Location: [facility];  Service: Gynecology;  Laterality: N/A;   WISDOM TOOTH EXTRACTION Bilateral     Home Medications:  No outpatient medications have been marked as taking for the 12/31/23 encounter [facility] Encounter).    Allergies:  Allergies  Allergen Reactions   Vicks Nyquil Cold & Flu Night [Dm-Apap-Cpm] Rash and Other (See Comments)    Red in face and hot    Family History  Problem Relation Age of Onset   Kidney failure Mother    Drug abuse Mother    Heart disease Father    Drug abuse Maternal Grandmother    Heart disease Paternal Grandfather    Cancer Paternal Grandfather    Hypertension Paternal Grandfather    Lung cancer Paternal Grandfather    Diabetes Paternal Grandfather    Heart disease Other    Asthma  Neg Hx    Birth defects Neg Hx     Social History:  reports that she has never smoked. She has been exposed to tobacco smoke. She has never used smokeless tobacco. She reports current alcohol  use of about 7.0 - 14.0 standard drinks of alcohol  per week. She reports that she does not use drugs.  ROS: A complete review of systems was performed.  All systems are negative except for pertinent findings as noted.  Physical Exam:  Vital signs in last 24 hours: Temp:  [97.5 F (36.4 C)-97.9 F (36.6 C)] 97.9  F (36.6 C) (02/04 0413) Pulse Rate:  [51-109] 79 (02/04 0830) Resp:  [14-26] 16 (02/04 0830) BP: (99-132)/(59-88) 104/73 (02/04 0830) SpO2:  [69 %-100 %] 100 % (02/04 0830) Weight:  [35 kg] 64 kg (02/03 2221) Constitutional:  Alert and oriented, uncomfortable and vomiting during interview. HEENT: [place] AT, moist mucus membranes.  Trachea midline, no masses Respiratory: Normal respiratory effort GI: Abdomen is soft, nondistended with some mild tenderness especially in the right lower quadrant and suprapubic area.  No rebound or guarding. GU: Foley catheter in place draining clear yellow urine Skin: No rashes, bruises or suspicious lesions Neurologic: Grossly intact, no focal deficits, moving all 4 extremities Psychiatric: Normal mood and affect   Laboratory Data:  Recent Labs    12/31/23 2238  WBC 22.3*  HGB 14.6  HCT 43.6   Recent Labs    12/31/23 2238  NA 133*  K 3.9  CL 98  CO2 19*  GLUCOSE 238*  BUN 14  CREATININE 0.94  CALCIUM  9.0    prelim blood cultures negative.  Urine culture pending  Most recent lactate has normalized, 1.8.  UA with greater than 50 red blood cells, 11-20 white blood cells, rare bacteria.  Radiologic Imaging: CT CYSTOGRAM ABD/PELVIS Result Date: 01/01/2024 CLINICAL DATA:  28 year old female status post recent hysterectomy. Nonspecific small volume of free fluid near the liver and spleen on routine CT Abdomen and Pelvis earlier today. Follow-up CT cystogram. EXAM: CT CYSTOGRAM (CT ABDOMEN AND PELVIS WITH CONTRAST) TECHNIQUE: Multi-detector CT imaging through the abdomen and pelvis was performed after dilute contrast had been introduced into the bladder for the purposes of performing CT cystography. RADIATION DOSE REDUCTION: This exam was performed according to the departmental dose-optimization program which includes automated exposure control, adjustment of the mA and/or kV according to patient size and/or use of iterative reconstruction technique.  CONTRAST:  OMNIPAQUE  IOHEXOL  300 MG/ML  SOLN COMPARISON:  CT Abdomen and Pelvis 0008 hours today. FINDINGS: Lower chest: Lower lung volumes with symmetric mild dependent atelectasis. No pericardial or pleural effusion. Hepatobiliary: Small volume of perihepatic fluid not significantly changed. Otherwise stable and negative liver and gallbladder. Pancreas: Negative. Spleen: Less low-density perisplenic fluid now, read distributed from the earlier CT. Otherwise stable and negative spleen. Adrenals/Urinary Tract: Normal adrenal glands. Symmetric renal contrast enhancement and contrast excretion to nondilated ureters. Intermittent left ureter contrast visible into the pelvis. Delayed renal images also demonstrate appropriate, symmetric renal contrast excretion. CT cystogram was performed with indwelling Foley catheter. Good bladder contrast opacification. And on both initial and delayed images there is a small posterior midline bladder leak of contrast demonstrated (series 2, image 75 and series 7, image 63) with contrast tracking from the leak site posteriorly into the cul-de-sac. Initial layering contrast within the cul-de-sac on series 2, image 69 is mildly increased in volume on the delayed images (series 7, image 58). Stomach/Bowel: Stable nondilated large bowel. Normal gas  containing appendix redemonstrated on series 2, image 64. No free intraperitoneal air is identified and the punctate focus of gas seen in the lower small bowel mesentery earlier today is no longer identified. Vascular/Lymphatic: Major arterial structures appear patent and normal. Normal caliber abdominal aorta. Portal venous system is patent. Lower abdominal and pelvic central venous structures also appear patent. No lymphadenopathy identified. Reproductive: Stable parametrial postoperative changes. Other: Previously demonstrated pelvic free fluid now partially opacified (series 2, image 69) as detailed above. Musculoskeletal: Stable.  No  acute osseous abnormality identified. IMPRESSION: 1. Cystogram is Positive for Bladder Leak/Tear located posteriorly near the midline on series 2, image 75. Small but progressive volume of extravasated intravesical contrast into the cul-de-sac on the delayed images. 2. No associated obstructive uropathy. Unremarkable kidneys and visible ureters. 3. And otherwise stable postoperative appearance of the abdomen and pelvis. Electronically Signed   By: VEAR Hurst M.D.   On: 01/01/2024 05:08   DG Chest Port 1 View Result Date: 01/01/2024 CLINICAL DATA:  Sepsis EXAM: PORTABLE CHEST 1 VIEW COMPARISON:  None Available. FINDINGS: The heart size and mediastinal contours are within normal limits. Both lungs are clear. The visualized skeletal structures are unremarkable. IMPRESSION: No active disease. Electronically Signed   By: Franky Stanford M.D.   On: 01/01/2024 00:39   CT ABDOMEN PELVIS W CONTRAST Result Date: 01/01/2024 CLINICAL DATA:  Right lower quadrant pain recent partial hysterectomy, pain after Foley removed EXAM: CT ABDOMEN AND PELVIS WITH CONTRAST TECHNIQUE: Multidetector CT imaging of the abdomen and pelvis was performed using the standard protocol following bolus administration of intravenous contrast. RADIATION DOSE REDUCTION: This exam was performed according to the departmental dose-optimization program which includes automated exposure control, adjustment of the mA and/or kV according to patient size and/or use of iterative reconstruction technique. CONTRAST:  100mL OMNIPAQUE  IOHEXOL  300 MG/ML  SOLN COMPARISON:  None Available. FINDINGS: Lower chest: Lung bases are clear Hepatobiliary: No focal liver abnormality is seen. No gallstones, gallbladder wall thickening, or biliary dilatation. Pancreas: Unremarkable. No pancreatic ductal dilatation or surrounding inflammatory changes. Spleen: Normal in size without focal abnormality. Adrenals/Urinary Tract: Adrenal glands are normal. Kidneys show no hydronephrosis.  Normal excretion of contrast. Catheter within the bladder which is decompressed. Only small volume of excreted contrast within the bladder on delayed views. Stomach/Bowel: Stomach nonenlarged. No dilated small bowel. No acute bowel wall thickening. Negative appendix Vascular/Lymphatic: No significant vascular findings are present. No enlarged abdominal or pelvic lymph nodes. Reproductive: Status post hysterectomy.  No suspicious adnexal mass Other: Small focus of gas in the mesentery, series 2, image 47 likely postoperative gas. Small volume free fluid near the liver and spleen with small moderate fluid in the pelvis, water  density Musculoskeletal: No acute or significant osseous findings. IMPRESSION: 1. Status post hysterectomy. Small focus of gas in the mesentery likely postoperative. Small volume free fluid near the liver and spleen with small to moderate fluid in the pelvis. This is indeterminate for postoperative fluid or urine ascites given clinical history. Foley catheter is present in decompressed urinary bladder, no frank extravasation of contrast on delayed views but only minimal contrast in the bladder, follow-up CT cystogram could be obtained if deemed clinically appropriate. Electronically Signed   By: Luke Bun M.D.   On: 01/01/2024 00:31   I have personally reviewed the above imaging and agree with radiologic interpretation.  Impression/ Plan:  27 year old female presenting meeting sepsis criteria possibly related to cystotomy.  Foley catheter has been replaced under abdominal pain is  improved.  Her lactate is also cleared with fluids and her prolactin is quite low, unclear whether there is truly an infectious source.  We had a lengthy discussion today at the bedside about management options.  Given that it has been over a week since surgery, dissection may be difficult and the defect is quite small.  Certainly exploring and surgically closing the cystotomy may decrease time with Foley  catheter, however could increase postoperative recovery given the need for abdominal incisions.  Alternatively, could conservatively managed a small leak.  Foley catheter is now back in place and her pain has improved significantly.  Her lactate has normalized with IV fluid resuscitation and initiation of empiric Zosyn .  At this point, using shared decision-making, we agreed to a more conservative approach.  She will need to maintain a Foley catheter for at least 2 weeks and a cystogram prior to removal.  Given that she is fairly young and healthy, it is likely that she will heal relatively quickly.  Recommend admission for observation and to ensure that her labs have normalized along with supportive care and antibiotics.  Should her abdominal exam change or clinical status deteriorate, could always consider being more surgically aggressive at that point.  She agrees with this plan.  I have communicated my plan to Dr. Malvina, her GYN team in [place] as well as our local GYN team.  Will continue to follow along.  Please secure chat or page with any questions or concerns.  01/01/2024, 10:01 AM  Rosina Riis,  MD

## 2024-01-01 NOTE — H&P (Signed)
 Faculty Practice Obstetrics and Gynecology Attending History and Physical  History obtained from chart review and phone communication with Dr. Josette Sink (ER physician)  Caroline [name] is a 28 y.o. [id number] s/p transvaginal hysterectomy and bilateral salpingectomy done on 12/25/23 by Dr. Fredirick for history of severe cervical dysplasia and irregular bleeding, complicated by possible bladder laceration, who presented to [facility] ER yesterday for abdominal pain after removal of leg bag in office earlier in the day.  Leg bag was placed after patient had urinary issues postoperatively, this was in place from 12/26/23 to 01/01/24. After removal in office, she voided 125 ml and was sent home. She reported voiding about 4 more times, but then was unable to have any urine for over 5 hours, and started having significant abdominal pain, nausea and vomiting which sent her to [facility] ER. On evaluation there, her vitals signs and labs were concerning for sepsis, also noted to have bladder urinary retention for which a foley was replaced and there was report of some blood noted in the urine.  She was started on the sepsis protocol, and Zosyn  was initiated.  Labs remarkable for Lactic Acid of 3.7>3.0, procalcitonin <0.10, WBC 22.3 (was 8.2 on discharge), hemoglobin 14.6, platelets 426K,  Cr 0.94, AST 47, ALT 76.  CXR neg.  CT showed small to moderate fluid in the pelvis, indeterminate for postoperative fluid or urine ascites, no frank extravasation of contrast.  Cystogram subsequently showed bladder leak/tear posteriorly near midline, and small/progressive volume of extravasated intravesical contrast into cul-de-sac.   Patient needed admission, and she desired transfer to [facility] for management so our service was consulted.  I accepted her admission, however, there is a wait for admission beds here at [facility].    Past Medical History:  Diagnosis Date   Abnormal uterine bleeding (AUB)    Anxiety,  generalized 08/06/2023   CIN III (cervical intraepithelial neoplasia III) 01/2023   colpo 01-31-2023  CIN 3/  LEEP 02-28-2023/  pap/ bx 09-05-2023  HGSIL, CIN3   Headache disorder 09/04/2018   History of child affected by Down syndrome    History of gestational diabetes    Hypertriglyceridemia 07/20/2021   Irregular menstrual cycle 12/14/2023   Migraines    per pt does not take meds   Situational anxiety    12-14-2023  anxious about being put to sleep for surgery   Past Surgical History:  Procedure Laterality Date   CESAREAN SECTION N/A 08/11/2017   Procedure: CESAREAN SECTION;  Surgeon: Izell Harari, MD;  Location: [facility] BIRTHING SUITES;  Service: Obstetrics;  Laterality: N/A;   VAGINAL HYSTERECTOMY N/A 12/25/2023   Procedure: HYSTERECTOMY VAGINAL;  Surgeon: Fredirick Glenys RAMAN, MD;  Location: [facility];  Service: Gynecology;  Laterality: N/A;   WISDOM TOOTH EXTRACTION Bilateral    OB History  Gravida Para Term Preterm AB Living  3 2 1 1 1 2   SAB IAB Ectopic Multiple Live Births  1 0 0 0 2    # Outcome Date GA Lbr Len/2nd Weight Sex Type Anes PTL Lv  3 Term 10/11/22 [redacted]w[redacted]d 07:31 / 03:49 3860 g F VBAC EPI  LIV     Birth Comments: WNL  2 Preterm 08/11/17 [redacted]w[redacted]d  1845 g F CS-LTranv Spinal  LIV  1 SAB 10/2016          Patient denies any other pertinent gynecologic issues.  No current facility-administered medications on file prior to encounter.   Current Outpatient Medications on File Prior  to Encounter  Medication Sig Dispense Refill   docusate sodium  (COLACE) 100 MG capsule Take 100 mg by mouth daily.     oxyCODONE  (OXY IR/ROXICODONE ) 5 MG immediate release tablet Take 1 tablet (5 mg total) by mouth every 6 (six) hours as needed for severe pain (pain score 7-10). 20 tablet 0   promethazine  (PHENERGAN ) 25 MG tablet Take 1 tablet (25 mg total) by mouth every 6 (six) hours as needed for nausea. 42 tablet 0   Allergies  Allergen Reactions   Vicks Nyquil Cold & Flu  Night [Dm-Apap-Cpm] Rash and Other (See Comments)    Red in face and hot    Social History:   reports that she has never smoked. She has been exposed to tobacco smoke. She has never used smokeless tobacco. She reports current alcohol  use of about 7.0 - 14.0 standard drinks of alcohol  per week. She reports that she does not use drugs. Family History  Problem Relation Age of Onset   Kidney failure Mother    Drug abuse Mother    Heart disease Father    Drug abuse Maternal Grandmother    Heart disease Paternal Grandfather    Cancer Paternal Grandfather    Hypertension Paternal Grandfather    Lung cancer Paternal Grandfather    Diabetes Paternal Grandfather    Heart disease Other    Asthma Neg Hx    Birth defects Neg Hx     Review of Systems: Pertinent items noted in HPI and remainder of comprehensive ROS otherwise negative.  PHYSICAL EXAM: Patient Vitals for the past 24 hrs:  BP Temp Temp src Pulse Resp SpO2 Height Weight  01/01/24 0715 -- -- -- 84 -- 99 % -- --  01/01/24 0645 -- -- -- 97 -- 99 % -- --  01/01/24 0615 -- -- -- 80 -- 98 % -- --  01/01/24 0545 -- -- -- 90 -- 98 % -- --  01/01/24 0515 109/72 -- -- 94 20 100 % -- --  01/01/24 0445 108/70 -- -- 73 -- 100 % -- --  01/01/24 0415 103/68 -- -- 84 -- 99 % -- --  01/01/24 0413 -- 97.9 F (36.6 C) Oral -- -- -- -- --  01/01/24 0412 112/68 -- -- 78 16 99 % -- --  01/01/24 0345 -- -- -- 92 -- 96 % -- --  01/01/24 0245 -- -- -- 85 -- 98 % -- --  01/01/24 0215 -- -- -- (!) 103 -- 99 % -- --  01/01/24 0145 -- -- -- 85 -- 100 % -- --  01/01/24 0115 105/75 -- -- 87 17 100 % -- --  01/01/24 0045 -- -- -- 95 -- 100 % -- --  01/01/24 0015 105/75 97.6 F (36.4 C) Oral (!) 109 17 100 % -- --  12/31/23 2345 118/77 -- -- (!) 51 20 (!) 69 % -- --  12/31/23 2231 -- (!) 97.5 F (36.4 C) Oral -- -- -- -- --  12/31/23 2221 132/88 -- -- 92 (!) 26 100 % 5' 2 (1.575 m) 64 kg  Performed by ER Physician at [facility], please refer to her  notes  Labs: Results for orders placed or performed during the hospital encounter of 12/31/23 (from the past 2 weeks)  Blood culture (single)   Collection Time: 12/31/23 10:38 PM   Specimen: BLOOD  Result Value Ref Range   Specimen Description BLOOD RIGHT ASSIST CONTROL    Special Requests      BOTTLES DRAWN  AEROBIC AND ANAEROBIC Blood Culture adequate volume   Culture      NO GROWTH < 12 HOURS Performed at [facility], [address] [place], [place] [address]    Report Status PENDING   CBC with Differential   Collection Time: 12/31/23 10:38 PM  Result Value Ref Range   WBC 22.3 (H) 4.0 - 10.5 K/uL   RBC 5.06 3.87 - 5.11 MIL/uL   Hemoglobin 14.6 12.0 - 15.0 g/dL   HCT 56.3 63.9 - 53.9 %   MCV 86.2 80.0 - 100.0 fL   MCH 28.9 26.0 - 34.0 pg   MCHC 33.5 30.0 - 36.0 g/dL   RDW 87.3 88.4 - 84.4 %   Platelets 426 (H) 150 - 400 K/uL   nRBC 0.0 0.0 - 0.2 %   Neutrophils Relative % 79 %   Neutro Abs 17.5 (H) 1.7 - 7.7 K/uL   Lymphocytes Relative 16 %   Lymphs Abs 3.5 0.7 - 4.0 K/uL   Monocytes Relative 4 %   Monocytes Absolute 1.0 0.1 - 1.0 K/uL   Eosinophils Relative 0 %   Eosinophils Absolute 0.1 0.0 - 0.5 K/uL   Basophils Relative 0 %   Basophils Absolute 0.1 0.0 - 0.1 K/uL   Immature Granulocytes 1 %   Abs Immature Granulocytes 0.20 (H) 0.00 - 0.07 K/uL  Comprehensive metabolic panel   Collection Time: 12/31/23 10:38 PM  Result Value Ref Range   Sodium 133 (L) 135 - 145 mmol/L   Potassium 3.9 3.5 - 5.1 mmol/L   Chloride 98 98 - 111 mmol/L   CO2 19 (L) 22 - 32 mmol/L   Glucose, Bld 238 (H) 70 - 99 mg/dL   BUN 14 6 - 20 mg/dL   Creatinine, Ser 9.05 0.44 - 1.00 mg/dL   Calcium  9.0 8.9 - 10.3 mg/dL   Total Protein 8.4 (H) 6.5 - 8.1 g/dL   Albumin 4.5 3.5 - 5.0 g/dL   AST 47 (H) 15 - 41 U/L   ALT 76 (H) 0 - 44 U/L   Alkaline Phosphatase 177 (H) 38 - 126 U/L   Total Bilirubin 1.0 0.0 - 1.2 mg/dL   GFR, Estimated >39 >39 mL/min   Anion gap 16 (H) 5 - 15   Lipase, blood   Collection Time: 12/31/23 10:38 PM  Result Value Ref Range   Lipase 27 11 - 51 U/L  Urinalysis, Routine w reflex microscopic -Urine, Clean Catch   Collection Time: 12/31/23 10:38 PM  Result Value Ref Range   Color, Urine YELLOW (A) YELLOW   APPearance CLEAR (A) CLEAR   Specific Gravity, Urine 1.012 1.005 - 1.030   pH 5.0 5.0 - 8.0   Glucose, UA >=500 (A) NEGATIVE mg/dL   Hgb urine dipstick LARGE (A) NEGATIVE   Bilirubin Urine NEGATIVE NEGATIVE   Ketones, ur NEGATIVE NEGATIVE mg/dL   Protein, ur 30 (A) NEGATIVE mg/dL   Nitrite NEGATIVE NEGATIVE   Leukocytes,Ua NEGATIVE NEGATIVE   RBC / HPF >50 0 - 5 RBC/hpf   WBC, UA 11-20 0 - 5 WBC/hpf   Bacteria, UA RARE (A) NONE SEEN   Squamous Epithelial / HPF 0-5 0 - 5 /HPF   Mucus PRESENT   Lactic acid, plasma   Collection Time: 12/31/23 10:38 PM  Result Value Ref Range   Lactic Acid, Venous 3.7 (HH) 0.5 - 1.9 mmol/L  Procalcitonin   Collection Time: 12/31/23 10:38 PM  Result Value Ref Range   Procalcitonin <0.10 ng/mL  Blood culture (  single)   Collection Time: 01/01/24 12:42 AM   Specimen: BLOOD  Result Value Ref Range   Specimen Description BLOOD LEFT ASSIST CONTROL    Special Requests      BOTTLES DRAWN AEROBIC AND ANAEROBIC Blood Culture results may not be optimal due to an inadequate volume of blood received in culture bottles   Culture      NO GROWTH < 12 HOURS Performed at [facility], [address] [place], [place] [address]    Report Status PENDING   Lactic acid, plasma   Collection Time: 01/01/24 12:42 AM  Result Value Ref Range   Lactic Acid, Venous 3.0 (HH) 0.5 - 1.9 mmol/L  Lactic acid, plasma   Collection Time: 01/01/24  3:08 AM  Result Value Ref Range   Lactic Acid, Venous 1.9 0.5 - 1.9 mmol/L  Lactic acid, plasma   Collection Time: 01/01/24  5:17 AM  Result Value Ref Range   Lactic Acid, Venous 2.9 (HH) 0.5 - 1.9 mmol/L  Results for orders placed or performed during the  hospital encounter of 12/25/23 (from the past 2 weeks)  Pregnancy, urine POC   Collection Time: 12/25/23 11:08 AM  Result Value Ref Range   Preg Test, Ur NEGATIVE NEGATIVE  Surgical pathology   Collection Time: 12/25/23  2:11 PM  Result Value Ref Range   SURGICAL PATHOLOGY      SURGICAL PATHOLOGY CASE: [id number] PATIENT: Sameka Searls Surgical Pathology Report     Clinical History: Abnormal uterine bleeding, 3 CIN (las)     FINAL MICROSCOPIC DIAGNOSIS:  A. UTERUS, CERVIX, FALLOPIAN TUBE, BILATERAL, HYSTERECTOMY: Cervix:           Unremarkable.           Negative for squamous intraepithelial lesion or malignancy.       Endocervix:           Nabothian cysts.           Negative for hyperplasia, atypia or malignancy.       Endometrium:           Benign secretory endometrium.           Negative for hyperplasia, atypia or malignancy.       Myometrium:           Unremarkable.           Negative for malignancy.       Serosa:           Unremarkable.           Negative for malignancy.       Bilateral fallopian tubes:           Benign fimbriated fallopian tubes.           Negative for malignancy.   GROSS DESCRIPTION:  Specimen: Uterus and separate bilateral fallopian tubes, received fresh Specimen integrity: Intact Siz e and shape: 7.4 x 4.3 x 4 cm symmetrical uterus Weight: The uterus weighs 68 g. Serosa: Smooth and tan Cervix: The ectocervical mucosa is smooth, tan and the external os is patent measuring 0.7 cm.  The endocervical mucosa is glistening tan-pink. Endometrium: The endometrial cavity measures 3.4 x 2.1 cm.  The endometrium is glistening tan-red and measures up to 0.5 cm in thickness. Myometrium: The myometrium measures up to 2 cm in thickness.  There are no myometrial nodules. Fallopian tubes: The fallopian tubes are each of approximately the same size measuring 5 cm in length and 0.6 cm in diameter. Block Summary: 13  blocks submitted 1-7  = sections of cervix to include the entire mucosal junction 8, 9 = endomyometrium 10, 11 = sections of one fallopian tube 12, 13 = sections of second fallopian tube (GRP 12/26/2023)    Final Diagnosis performed by Pepper Dutton, MD.   Electronically signed 12/27/2023 Technical component performed at [facility], [address] [place], [place] [address].  Professional component performed at [employer], [address] [place], [place] [address].  Immunohistochemistry Technical component (if applicable) was performed at [facility]. [address], [place], [place] [address].   IMMUNOHISTOCHEMISTRY DISCLAIMER (if applicable): Some of these immunohistochemical stains may have been developed and the performance characteristics determine by [facility]. Some may not have been cleared or approved by the U.S. Food and Drug Administration. The FDA has determined that such clearance or approval is not necessary. This test is used for clinical purposes. It should not be regarded as investigational or for research. This laboratory is certified under the Clinical Laboratory Improvement Amendments of 1988 (CLIA-88) as qualified to perform high complexity clinical laboratory testing.  The controls stained appropriately.   IHC stains  are performed on formalin fixed, paraffin embedded tissue using a 3,3diaminobenzidine (DAB) chromogen and Leica Bond Autostainer System. The staining intensity of the nucleus is score manually and is reported as the percentage of tumor cell nuclei demonstrating specific nuclear staining. The specimens are fixed in 10% Neutral Formalin for at least 6 hours and up to 72hrs. These tests are validated on decalcified tissue. Results should be interpreted with caution given the possibility of false negative results on decalcified specimens. Antibody Clones are as follows ER-clone 31F, PR-clone 16,  Ki67- clone MM1. Some of these immunohistochemical stains may have been developed and the performance characteristics determined by [facility].   CBC   Collection Time: 12/25/23  7:02 PM  Result Value Ref Range   WBC 13.0 (H) 4.0 - 10.5 K/uL   RBC 4.64 3.87 - 5.11 MIL/uL   Hemoglobin 13.6 12.0 - 15.0 g/dL   HCT 59.0 63.9 - 53.9 %   MCV 88.1 80.0 - 100.0 fL   MCH 29.3 26.0 - 34.0 pg   MCHC 33.3 30.0 - 36.0 g/dL   RDW 87.1 88.4 - 84.4 %   Platelets 221 150 - 400 K/uL   nRBC 0.0 0.0 - 0.2 %  Creatinine, serum   Collection Time: 12/25/23  7:02 PM  Result Value Ref Range   Creatinine, Ser 0.72 0.44 - 1.00 mg/dL   GFR, Estimated >39 >39 mL/min  CBC   Collection Time: 12/26/23  3:23 AM  Result Value Ref Range   WBC 8.2 4.0 - 10.5 K/uL   RBC 4.19 3.87 - 5.11 MIL/uL   Hemoglobin 12.4 12.0 - 15.0 g/dL   HCT 63.6 63.9 - 53.9 %   MCV 86.6 80.0 - 100.0 fL   MCH 29.6 26.0 - 34.0 pg   MCHC 34.2 30.0 - 36.0 g/dL   RDW 87.3 88.4 - 84.4 %   Platelets 214 150 - 400 K/uL   nRBC 0.0 0.0 - 0.2 %    Imaging Studies: CT CYSTOGRAM ABD/PELVIS Result Date: 01/01/2024 CLINICAL DATA:  28 year old female status post recent hysterectomy. Nonspecific small volume of free fluid near the liver and spleen on routine CT Abdomen and Pelvis earlier today. Follow-up CT cystogram. EXAM: CT CYSTOGRAM (CT ABDOMEN AND PELVIS WITH CONTRAST) TECHNIQUE: Multi-detector CT imaging through the abdomen and pelvis  was performed after dilute contrast had been introduced into the bladder for the purposes of performing CT cystography. RADIATION DOSE REDUCTION: This exam was performed according to the departmental dose-optimization program which includes automated exposure control, adjustment of the mA and/or kV according to patient size and/or use of iterative reconstruction technique. CONTRAST:  OMNIPAQUE  IOHEXOL  300 MG/ML  SOLN COMPARISON:  CT Abdomen and Pelvis 0008 hours today. FINDINGS: Lower chest: Lower lung  volumes with symmetric mild dependent atelectasis. No pericardial or pleural effusion. Hepatobiliary: Small volume of perihepatic fluid not significantly changed. Otherwise stable and negative liver and gallbladder. Pancreas: Negative. Spleen: Less low-density perisplenic fluid now, read distributed from the earlier CT. Otherwise stable and negative spleen. Adrenals/Urinary Tract: Normal adrenal glands. Symmetric renal contrast enhancement and contrast excretion to nondilated ureters. Intermittent left ureter contrast visible into the pelvis. Delayed renal images also demonstrate appropriate, symmetric renal contrast excretion. CT cystogram was performed with indwelling Foley catheter. Good bladder contrast opacification. And on both initial and delayed images there is a small posterior midline bladder leak of contrast demonstrated (series 2, image 75 and series 7, image 63) with contrast tracking from the leak site posteriorly into the cul-de-sac. Initial layering contrast within the cul-de-sac on series 2, image 69 is mildly increased in volume on the delayed images (series 7, image 58). Stomach/Bowel: Stable nondilated large bowel. Normal gas containing appendix redemonstrated on series 2, image 64. No free intraperitoneal air is identified and the punctate focus of gas seen in the lower small bowel mesentery earlier today is no longer identified. Vascular/Lymphatic: Major arterial structures appear patent and normal. Normal caliber abdominal aorta. Portal venous system is patent. Lower abdominal and pelvic central venous structures also appear patent. No lymphadenopathy identified. Reproductive: Stable parametrial postoperative changes. Other: Previously demonstrated pelvic free fluid now partially opacified (series 2, image 69) as detailed above. Musculoskeletal: Stable.  No acute osseous abnormality identified. IMPRESSION: 1. Cystogram is Positive for Bladder Leak/Tear located posteriorly near the midline on  series 2, image 75. Small but progressive volume of extravasated intravesical contrast into the cul-de-sac on the delayed images. 2. No associated obstructive uropathy. Unremarkable kidneys and visible ureters. 3. And otherwise stable postoperative appearance of the abdomen and pelvis. Electronically Signed   By: VEAR Hurst M.D.   On: 01/01/2024 05:08   DG Chest Port 1 View Result Date: 01/01/2024 CLINICAL DATA:  Sepsis EXAM: PORTABLE CHEST 1 VIEW COMPARISON:  None Available. FINDINGS: The heart size and mediastinal contours are within normal limits. Both lungs are clear. The visualized skeletal structures are unremarkable. IMPRESSION: No active disease. Electronically Signed   By: Franky Stanford M.D.   On: 01/01/2024 00:39   CT ABDOMEN PELVIS W CONTRAST Result Date: 01/01/2024 CLINICAL DATA:  Right lower quadrant pain recent partial hysterectomy, pain after Foley removed EXAM: CT ABDOMEN AND PELVIS WITH CONTRAST TECHNIQUE: Multidetector CT imaging of the abdomen and pelvis was performed using the standard protocol following bolus administration of intravenous contrast. RADIATION DOSE REDUCTION: This exam was performed according to the departmental dose-optimization program which includes automated exposure control, adjustment of the mA and/or kV according to patient size and/or use of iterative reconstruction technique. CONTRAST:  OMNIPAQUE  IOHEXOL  300 MG/ML  SOLN COMPARISON:  None Available. FINDINGS: Lower chest: Lung bases are clear Hepatobiliary: No focal liver abnormality is seen. No gallstones, gallbladder wall thickening, or biliary dilatation. Pancreas: Unremarkable. No pancreatic ductal dilatation or surrounding inflammatory changes. Spleen: Normal in size without focal abnormality. Adrenals/Urinary Tract: Adrenal  glands are normal. Kidneys show no hydronephrosis. Normal excretion of contrast. Catheter within the bladder which is decompressed. Only small volume of excreted contrast within the bladder  on delayed views. Stomach/Bowel: Stomach nonenlarged. No dilated small bowel. No acute bowel wall thickening. Negative appendix Vascular/Lymphatic: No significant vascular findings are present. No enlarged abdominal or pelvic lymph nodes. Reproductive: Status post hysterectomy.  No suspicious adnexal mass Other: Small focus of gas in the mesentery, series 2, image 47 likely postoperative gas. Small volume free fluid near the liver and spleen with small moderate fluid in the pelvis, water  density Musculoskeletal: No acute or significant osseous findings. IMPRESSION: 1. Status post hysterectomy. Small focus of gas in the mesentery likely postoperative. Small volume free fluid near the liver and spleen with small to moderate fluid in the pelvis. This is indeterminate for postoperative fluid or urine ascites given clinical history. Foley catheter is present in decompressed urinary bladder, no frank extravasation of contrast on delayed views but only minimal contrast in the bladder, follow-up CT cystogram could be obtained if deemed clinically appropriate. Electronically Signed   By: Luke Bun M.D.   On: 01/01/2024 00:31    Assessment: Principal Problem:   Sepsis [facility]) Active Problems:   Status post hysterectomy on 12/25/23   Postoperative urinary retention   Intraoperative bladder injury   Plan: Admit to Progressive Step Down Floor given ongoing sepsis, can be at [facility] or [facility]. While patient is still there at [facility], please consult Gynecology service onsite to help with management. Please follow Urology recommendations about further management of intraoperative bladder injury as noted on cystogram. Continue Zosyn  and rest of sepsis protocol. We will continue to follow along remotely from the [facility] until she is transferred to our campus.  Thank you for taking care of our patient. Please call [phone] [facility] OB/GYN Consult Attending Monday-Friday 8am - 5pm) or [phone] [facility] OB/GYN  Attending On Call all day, every day) for any gynecologic concerns at any time.     GLORIS HUGGER, MD, FACOG Obstetrician & Gynecologist, [facility] for [employer], [facility]

## 2024-01-01 NOTE — Sepsis Progress Note (Signed)
 Following for sepsis monitoring ?

## 2024-01-01 NOTE — ED Notes (Addendum)
 Patient is resting comfortably.

## 2024-01-01 NOTE — Progress Notes (Signed)
 CODE SEPSIS - PHARMACY COMMUNICATION  **Broad Spectrum Antibiotics should be administered within 1 hour of Sepsis diagnosis**  Time Code Sepsis Called/Page Received: 2356  Antibiotics Ordered: Zosyn   Time of 1st antibiotic administration: 0003  Rankin CANDIE Dills, PharmD, [facility] 01/01/2024 12:07 AM

## 2024-01-01 NOTE — H&P (Signed)
 GYN H&P Name: Caroline [name] MRN: [id number] Date of Service: 01/01/2024   CC: cystotomy   Subjective:  HPI: Caroline [name] is a 28 y.o. [phone] with a hx of anxiety, migraines, gHTN, GDM, CIN3 s/p LEEP and now hysterectomy who presented to the hospital on 12/31/23 with abdominal pain. Patient is s/p TVH BS on 12/25/23 with Dr. Fredirick. Surgery was complicated by a cystotomy which was repaired intra-operatively and she was discharged home with a foley. Foley catheter was removed in the office on 12/31/23.   Patient reports voided normally a few times yesterday afternoon but then was not able to void for 5-6 hours last night. Reports nausea and severe abdominal pain. Denies vomiting, fevers, CP, SOB. BM yesterday AM- small. Denies vaginal bleeding.   ED vitals wnl other than HR intermittently tachy to low 100s. Labs with elevated lactic acid 3.7>3>2.9>2.7>1.8 (0915). Prolcalcitonin neg. CMP with CO2 19, AST 47, ALT 76, AP 177. UA with large Hb, rare abcteria, 30 protein. Lipase wnl. CBC with WBC count 22.3, plt 426. CT A/P w/ contrast with small volume free fluid near live/spleen and small-moderate fluid in pelvis. CXR wnl. CT cystogram showed posterior midline bladder tear with extravasated intravesical contrast into the cul-de-sac.  Reports pain improved now mild. Still nauseated. Vomited earlier this AM. Passing flatus.    ROS: All other systems reviewed and negative   GYN Hx: - LMP: Patient's last menstrual period was 12/07/2023 (approximate).  - Menses: s/p hysterectomy - Pap hx: Denies hx of abnormal paps, last pap 09/05/23 NILM with neg HRHPV, Hx LEEP for CIN3 - STI hx: Denies hx of STIs\ - Sexual preference: sexually active with husband - Contraceptive method: s/p hysterectomy - Abdominal surgeries: TVH BS, c/s - GYN procedures: LEEP   OB Hx:  OB History  Gravida Para Term Preterm AB Living  3 2 1 1 1 2   SAB IAB Ectopic Multiple Live Births  1 0 0 0 2    # Outcome Date GA Lbr  Len/2nd Weight Sex Type Anes PTL Lv  3 Term 10/11/22 [redacted]w[redacted]d 07:31 / 03:49 3860 g F VBAC EPI  LIV     Birth Comments: WNL  2 Preterm 08/11/17 [redacted]w[redacted]d  1845 g F CS-LTranv Spinal  LIV  1 SAB 10/2016             PMH:  Past Medical History:  Diagnosis Date   Abnormal uterine bleeding (AUB)    Anxiety, generalized 08/06/2023   CIN III (cervical intraepithelial neoplasia III) 01/2023   colpo 01-31-2023  CIN 3/  LEEP 02-28-2023/  pap/ bx 09-05-2023  HGSIL, CIN3   Headache disorder 09/04/2018   History of child affected by Down syndrome    History of gestational diabetes    Hypertriglyceridemia 07/20/2021   Irregular menstrual cycle 12/14/2023   Migraines    per pt does not take meds   Situational anxiety    12-14-2023  anxious about being put to sleep for surgery     PSH:  Past Surgical History:  Procedure Laterality Date   CESAREAN SECTION N/A 08/11/2017   Procedure: CESAREAN SECTION;  Surgeon: Izell Harari, MD;  [facility];  Service: Obstetrics;  Laterality: N/A;   VAGINAL HYSTERECTOMY N/A 12/25/2023   Procedure: HYSTERECTOMY VAGINAL;  Surgeon: Fredirick Glenys RAMAN, MD;  Location: [facility];  Service: Gynecology;  Laterality: N/A;   WISDOM TOOTH EXTRACTION Bilateral      Meds:  Prior to Admission medications   Medication Sig  Start Date End Date Taking? Authorizing Provider  docusate sodium  (COLACE) 100 MG capsule Take 100 mg by mouth daily.    [provider]  oxyCODONE  (OXY IR/ROXICODONE ) 5 MG immediate release tablet Take 1 tablet (5 mg total) by mouth every 6 (six) hours as needed for severe pain (pain score 7-10). 12/26/23   Fredirick Glenys RAMAN, MD  promethazine  (PHENERGAN ) 25 MG tablet Take 1 tablet (25 mg total) by mouth every 6 (six) hours as needed for nausea. 12/26/23   Fredirick Glenys RAMAN, MD     Allergies:  Allergies  Allergen Reactions   Vicks Nyquil Cold & Flu Night [Dm-Apap-Cpm] Rash and Other (See Comments)    Red in face and hot      FH:  Family History  Problem Relation Age of Onset   Kidney failure Mother    Drug abuse Mother    Heart disease Father    Drug abuse Maternal Grandmother    Heart disease Paternal Grandfather    Cancer Paternal Grandfather    Hypertension Paternal Grandfather    Lung cancer Paternal Grandfather    Diabetes Paternal Grandfather    Heart disease Other    Asthma Neg Hx    Birth defects Neg Hx      SH:  Social History   Socioeconomic History   Marital status: Married    Spouse name: Not on file   Number of children: Not on file   Years of education: Not on file   Highest education level: Not on file  Occupational History   Not on file  Tobacco Use   Smoking status: Never    Passive exposure: Past   Smokeless tobacco: Never  Vaping Use   Vaping status: Never Used  Substance and Sexual Activity   Alcohol  use: Yes    Alcohol /week: 7.0 - 14.0 standard drinks of alcohol     Types: 7 - 14 Glasses of wine per week    Comment: 12-14-2023  pt stated 1-2 glass wine daily   Drug use: Never   Sexual activity: Yes    Partners: Male    Birth control/protection: None  Other Topics Concern   Not on file  Social History Narrative   Not on file   Social Drivers of Health   Financial Resource Strain: Not on file  Food Insecurity: No Food Insecurity (10/11/2022)   Hunger Vital Sign    Worried About Running Out of Food in the Last Year: Never true    Ran Out of Food in the Last Year: Never true  Transportation Needs: No Transportation Needs (10/11/2022)   PRAPARE - Administrator, Civil Service (Medical): No    Lack of Transportation (Non-Medical): No  Physical Activity: Not on file  Stress: Not on file  Social Connections: Not on file  Intimate Partner Violence: Not At Risk (10/11/2022)   Humiliation, Afraid, Rape, and Kick questionnaire    Fear of Current or Ex-Partner: No    Emotionally Abused: No    Physically Abused: No    Sexually Abused: No    Objective:  Physical Exam: Vitals:   01/01/24 1130 01/01/24 1200  BP: 107/73 99/60  Pulse: (!) 110 68  Resp:    Temp:    SpO2: 98% 99%   Body mass index is 25.81 kg/m.   General: Alert, NAD HEENT: Mucus membranes moist.  CV: RRR Resp: CTAB, no increased work of breathing Abd: BS present, soft, tender to palpation throughout, no rebound, + voluntary guarding  Skin: No rashes.  Ext: No peripheral edema. Psych: Appropriate affect.    Labs: Recent Labs    12/31/23 2238  WBC 22.3*  HGB 14.6  HCT 43.6  PLT 426*   Recent Labs    12/31/23 2238  NA 133*  K 3.9  CL 98  CO2 19*  CREATININE 0.94  BUN 14  CALCIUM  9.0  ALT 76*  AST 47*     Radiology: CT CYSTOGRAM (CT ABDOMEN AND PELVIS WITH CONTRAST)- 01/01/24   TECHNIQUE: Multi-detector CT imaging through the abdomen and pelvis was performed after dilute contrast had been introduced into the bladder for the purposes of performing CT cystography.   RADIATION DOSE REDUCTION: This exam was performed according to the departmental dose-optimization program which includes automated exposure control, adjustment of the mA and/or kV according to patient size and/or use of iterative reconstruction technique.   CONTRAST:  OMNIPAQUE  IOHEXOL  300 MG/ML  SOLN   COMPARISON:  CT Abdomen and Pelvis 0008 hours today.   FINDINGS: Lower chest: Lower lung volumes with symmetric mild dependent atelectasis. No pericardial or pleural effusion.   Hepatobiliary: Small volume of perihepatic fluid not significantly changed. Otherwise stable and negative liver and gallbladder.   Pancreas: Negative.   Spleen: Less low-density perisplenic fluid now, read distributed from the earlier CT. Otherwise stable and negative spleen.   Adrenals/Urinary Tract: Normal adrenal glands. Symmetric renal contrast enhancement and contrast excretion to nondilated ureters. Intermittent left ureter contrast visible into the pelvis. Delayed renal  images also demonstrate appropriate, symmetric renal contrast excretion.   CT cystogram was performed with indwelling Foley catheter. Good bladder contrast opacification. And on both initial and delayed images there is a small posterior midline bladder leak of contrast demonstrated (series 2, image 75 and series 7, image 63) with contrast tracking from the leak site posteriorly into the cul-de-sac. Initial layering contrast within the cul-de-sac on series 2, image 69 is mildly increased in volume on the delayed images (series 7, image 58).   Stomach/Bowel: Stable nondilated large bowel. Normal gas containing appendix redemonstrated on series 2, image 64. No free intraperitoneal air is identified and the punctate focus of gas seen in the lower small bowel mesentery earlier today is no longer identified.   Vascular/Lymphatic: Major arterial structures appear patent and normal. Normal caliber abdominal aorta. Portal venous system is patent. Lower abdominal and pelvic central venous structures also appear patent. No lymphadenopathy identified.   Reproductive: Stable parametrial postoperative changes.   Other: Previously demonstrated pelvic free fluid now partially opacified (series 2, image 69) as detailed above.   Musculoskeletal: Stable.  No acute osseous abnormality identified.   IMPRESSION: 1. Cystogram is Positive for Bladder Leak/Tear located posteriorly near the midline on series 2, image 75. Small but progressive volume of extravasated intravesical contrast into the cul-de-sac on the delayed images.   2. No associated obstructive uropathy. Unremarkable kidneys and visible ureters.   3. And otherwise stable postoperative appearance of the abdomen and pelvis.   Assessment/Plan: Caroline [name] is a 28 y.o. [phone] with a hx of anxiety, migraines, gHTN, GDM, CIN3 s/p LEEP and now hysterectomy who presented to the hospital on 12/31/23 with abdominal pain. Patient meets  criteria for sepsis given intermittent tachycardia, transaminitis, leukocytosis, and lactic acidosis. Low suspicion for co-occurring infection and suspect all of this is secondary to intra-peritoneal bladder leakage rather than an infection but need further moniroting.   #Cystotomy  #Lactic acidosis, resolved #Transaminitis #Leukocytosis  - Admit for observation - CT  cystogram showed a small cystotomy with intraperitoneal urine leakage - WBC count 22.3 - AST/ALT 47/76 - Lactic acid 3.7>3>2.9>2.7>1.8 (0915). Procalcitonin low.  - Bcx in process - Ucx in process - AM CBC and CMP ordered - IVF: NS @ 100cc/hr x 24 hrs - Continue IV Zosyn  for now. If labs improving and remains clinically stable, plan to discontinue antibiotic treatment. Would plan for PO antibiotic prophylaxis given cystotomy and plan for prolonged indwelling catheter.  - Regular diet - Vitals q4h - Urology consulted and primarily managing, appreciate assistance. Per their discussion, plan is for conservative management with foley catheter. - Foley catheter in place- will remain until follow-up with urology - Strict Is&Os - Pain: PO oxy q4h PRN and IV Dilaudid  1 mg q2h PRN - Nausea: IV or PO Zofran  q6h PRN  Dispo: possible discharge home tomorrow  Electronically signed: Beverli LULLA Dinsmore, MD, 01/01/2024 12:41 PM

## 2024-01-01 NOTE — ED Notes (Signed)
Per Ms. Fraser Din, she spoke with [facility] no bed for a direct admit @ this time.

## 2024-01-01 NOTE — ED Notes (Signed)
Pt no longer wishes to transfer out of hospital for treatment as her OB is out of town this week.

## 2024-01-02 ENCOUNTER — Other Ambulatory Visit: Payer: Self-pay | Admitting: Physician Assistant

## 2024-01-02 DIAGNOSIS — N9981 Other intraoperative complications of genitourinary system: Secondary | ICD-10-CM

## 2024-01-02 DIAGNOSIS — N9972 Accidental puncture and laceration of a genitourinary system organ or structure during other procedure: Secondary | ICD-10-CM

## 2024-01-02 DIAGNOSIS — T85631A Leakage of intraperitoneal dialysis catheter, initial encounter: Secondary | ICD-10-CM | POA: Diagnosis not present

## 2024-01-02 DIAGNOSIS — A419 Sepsis, unspecified organism: Secondary | ICD-10-CM | POA: Diagnosis not present

## 2024-01-02 LAB — COMPREHENSIVE METABOLIC PANEL
ALT: 42 U/L (ref 0–44)
AST: 21 U/L (ref 15–41)
Albumin: 3 g/dL — ABNORMAL LOW (ref 3.5–5.0)
Alkaline Phosphatase: 104 U/L (ref 38–126)
Anion gap: 5 (ref 5–15)
BUN: 9 mg/dL (ref 6–20)
CO2: 25 mmol/L (ref 22–32)
Calcium: 8.2 mg/dL — ABNORMAL LOW (ref 8.9–10.3)
Chloride: 107 mmol/L (ref 98–111)
Creatinine, Ser: 0.72 mg/dL (ref 0.44–1.00)
GFR, Estimated: >60 mL/min (ref >60)
Glucose, Bld: 92 mg/dL (ref 70–99)
Potassium: 3.8 mmol/L (ref 3.5–5.1)
Sodium: 137 mmol/L (ref 135–145)
Total Bilirubin: 0.3 mg/dL (ref 0.0–1.2)
Total Protein: 6.1 g/dL — ABNORMAL LOW (ref 6.5–8.1)

## 2024-01-02 LAB — CBC
HCT: 31.5 % — ABNORMAL LOW (ref 36.0–46.0)
Hemoglobin: 10.5 g/dL — ABNORMAL LOW (ref 12.0–15.0)
MCH: 29.3 pg (ref 26.0–34.0)
MCHC: 33.3 g/dL (ref 30.0–36.0)
MCV: 88 fL (ref 80.0–100.0)
Platelets: 223 10*3/uL (ref 150–400)
RBC: 3.58 MIL/uL — ABNORMAL LOW (ref 3.87–5.11)
RDW: 12.8 % (ref 11.5–15.5)
WBC: 5.4 10*3/uL (ref 4.0–10.5)
nRBC: 0 % (ref 0.0–0.2)

## 2024-01-02 LAB — URINE CULTURE: Culture: NO GROWTH

## 2024-01-02 MED ORDER — OXYCODONE HCL 5 MG PO TABS: 5.0000 mg | ORAL_TABLET | Freq: Four times a day (QID) | ORAL | 0 refills | Status: AC | PRN

## 2024-01-02 MED ORDER — OXYBUTYNIN CHLORIDE 5 MG PO TABS: 5.0000 mg | ORAL_TABLET | Freq: Three times a day (TID) | ORAL | 0 refills | Status: AC | PRN

## 2024-01-02 MED ORDER — TRIMETHOPRIM 100 MG PO TABS: 100.0000 mg | ORAL_TABLET | Freq: Every day | ORAL | 0 refills | Status: AC

## 2024-01-02 MED ORDER — ACETAMINOPHEN 500 MG PO TABS: 1000.0000 mg | ORAL_TABLET | Freq: Four times a day (QID) | ORAL | 2 refills | Status: AC | PRN

## 2024-01-02 MED ORDER — TRIMETHOPRIM 100 MG PO TABS
100.0000 mg | ORAL_TABLET | Freq: Every day | ORAL | Status: DC
  Filled 2024-01-02: qty 1

## 2024-01-02 MED ORDER — POLYETHYLENE GLYCOL 3350 17 GM/SCOOP PO POWD: 1.0000 | Freq: Once | ORAL | 0 refills | Status: AC

## 2024-01-02 NOTE — Progress Notes (Signed)
 Patient discharged home with family. Discharge instructions, when to follow up, and prescriptions reviewed with patient. Patient verbalized understanding. Patient will be escorted out by auxiliary.

## 2024-01-02 NOTE — Progress Notes (Signed)
 GYN Progress Note   Name: Caroline [name] MRN: [id number] Date of service: 01/02/2024    Subjective   Doing well. Feeling much better. Minimal abdominal pain. Ate dinner and breakfast this morning without any nausea and vomiting. Foley in place. Passing flatus but no BM. Denies fevers/chills/CP/SOB.   Objective   Temp:  [98.2 F (36.8 C)-98.9 F (37.2 C)] 98.5 F (36.9 C) (02/05 0720) Pulse Rate:  [68-110] 85 (02/05 0720) Resp:  [16-18] 18 (02/05 0720) BP: (95-118)/(58-76) 100/65 (02/05 0720) SpO2:  [97 %-100 %] 98 % (02/05 0720)  Intake/Output Summary (Last 24 hours) at 01/02/2024 0910 Last data filed at 01/02/2024 0400 Gross per 24 hour  Intake 127.5 ml  Output 3275 ml  Net -3147.5 ml     Gen: Alert, no acute distress CV: RRR Resp: CTAB on anterior lung fields, no increased work of breathing Abd: soft, minimally tender, no rebound or guarding Ext: No peripheral edema. SCDs in place.      Labs: Recent Labs    12/31/23 2238 01/02/24 0533  WBC 22.3* 5.4  HGB 14.6 10.5*  HCT 43.6 31.5*  MCV 86.2 88.0  PLT 426* 223   Recent Labs    12/31/23 2238 01/02/24 0533  NA 133* 137  K 3.9 3.8  CL 98 107  CO2 19* 25  CREATININE 0.94 0.72  BUN 14 9  CALCIUM  9.0 8.2*  ALT 76* 42  AST 47* 21   No results found for: MG     Assessment & Plan   Caroline [name] is a 28 y.o. [phone] with a hx of anxiety, migraines, gHTN, GDM, CIN3 s/p TVH BS on 12/25/23 with Dr. Fredirick complicated by a cystotomy who is admitted for intra-peritoneal bladder leakage. Patient met criteria for sepsis on admission given intermittent tachycardia, transaminitis, leukocytosis, and lactic acidosis. All of these have resolved and no evidence of co-occurring infection. Suspect all of this was secondary to intra-peritoneal bladder leakage.   #Cystotomy  #Lactic acidosis, resolved #Transaminitis, resolved #Leukocytosis , resolved - CT cystogram showed a small cystotomy with intraperitoneal urine  leakage - WBC count 22.3 > 5.4 - AST/ALT 47/76 > 21/42 - Lactic acid 3.7>3>2.9>2.7>1.8 (0915). Procalcitonin low.  - Bcx in process - Ucx in process - Discontinue IV Zosyn  2/4-2/5  - Start PO trimethoprim  100 mg daily per urology for prophylaxis given cystotomy and plan for prolonged indwelling catheter.  - Urology consulted and primarily managing, appreciate assistance. Per their discussion, plan is for conservative management with foley catheter. - Foley catheter in place- will remain until follow-up with urology - Pain: PO oxy q4h PRN, Tylenol  PRN, oxybutynin  PRN for bladder spasms  #Anemia, clinically stable - Hb 14.6>10.5 - Suspect dilutional, no evidence of bleeding. Vitals wnl.    Dispo: discharge home. Urology has scheduled follow-up. Patient will schedule follow-up with her GYN.    Electronically signed: Beverli LULLA Dinsmore, MD, 01/02/2024 9:10 AM

## 2024-01-02 NOTE — Discharge Instructions (Signed)
-   Take an over the counter probiotic while taking the antibiotic

## 2024-01-02 NOTE — Discharge Summary (Signed)
 GYN Discharge Summary    Patient Name: Caroline [name]   DOB:[birth year] MEDICAL RECORD [id number]   Date of Admission: 12/31/2023 Date of Discharge: 01/02/2024   Admitting Diagnosis: Post-operative pain [G89.18] Bladder leak [R32] Vomiting in adult [R11.10] Sepsis (HCC) [A41.9] Acute sepsis (HCC) [A41.9] Leukocytosis Transaminitis  Discharge Diagnosis: Intraperitoneal bladder leak Anemia   Condition on Discharge: Good    Brief Hospital Course:  Caroline [name] is a 28 y.o. [phone] with a hx of anxiety, migraines, gHTN, GDM, CIN3 s/p TVH BS on 12/25/23 with Dr. Fredirick complicated by a cystotomy who was admitted for intra-peritoneal bladder leakage. She presented to the ED on 12/31/23 after her foley catheter was pulled in clinic earlier that day and found to have intra-peritoneal bladder leak. A foley catheter was placed and urology consulted. Patient met criteria for sepsis on admission given intermittent tachycardia, transaminitis, leukocytosis, and lactic acidosis. She was on IV Zosyn  for 24 hours to exclude co-occurring infection. She remained afebrile with normal vitals and labs normalized without any evidence of infection. She was discharge home in stable condition with a foley catheter and on trimethoprim  for prophylaxis. She will see urology and her primary GYN for follow-up.      Discharge Medications:  Allergies as of 01/02/2024       Reactions   Vicks Nyquil Cold & Flu Night [dm-apap-cpm] Rash, Other (See Comments)   Red in face and hot        Medication List     STOP taking these medications    docusate sodium  100 MG capsule Commonly known as: COLACE       TAKE these medications    acetaminophen  500 MG tablet Commonly known as: TYLENOL  Take 2 tablets (1,000 mg total) by mouth every 6 (six) hours as needed for mild pain (pain score 1-3).   oxybutynin  5 MG tablet Commonly known as: DITROPAN  Take 1 tablet (5 mg total) by mouth every 8 (eight) hours as needed  for up to 14 days for bladder spasms.   oxyCODONE  5 MG immediate release tablet Commonly known as: Oxy IR/ROXICODONE  Take 1 tablet (5 mg total) by mouth every 6 (six) hours as needed for up to 3 days for severe pain (pain score 7-10).   polyethylene glycol powder 17 GM/SCOOP powder Commonly known as: GLYCOLAX /MIRALAX  Take 255 g by mouth once for 1 dose.   promethazine  25 MG tablet Commonly known as: PHENERGAN  Take 1 tablet (25 mg total) by mouth every 6 (six) hours as needed for nausea.   trimethoprim  100 MG tablet Commonly known as: TRIMPEX  Take 1 tablet (100 mg total) by mouth daily for 14 days.          Discharge Activities:  No heavy lifting (15 lbs) x 4 weeks. Pelvic rest weeks. No driving while on narcotic pain medication.    Discharge Diet:  Regular diet    Discharge Instructions:  Take all medications as directed and keep all follow-up appointments. Do not smoke, drink alcohol , or use drugs. Seek care immediately should you experience fever, inability to keep down food or liquids, increasing pain, vaginal bleeding.    Discharge Follow-Up:  - Urology follow-up within 2 weeks - See primary GYN within 2 weeks   Caroline Dinsmore, MD, 01/02/2024

## 2024-01-02 NOTE — Progress Notes (Addendum)
 Urology Inpatient Progress Note  Subjective: No acute events overnight.  She is afebrile, VSS. Creatinine down, 0.72.  WBC count down, 5.4.  Hemoglobin down, 10.5. Urine culture pending, blood cultures pending with no growth at 2 days.  On antibiotics as below. Foley catheter in place draining clear, yellow urine. She reports some burning discomfort in the RLQ.  Anti-infectives: Anti-infectives (From admission, onward)    Start     Dose/Rate Route Frequency Ordered Stop   01/02/24 1000  trimethoprim  (TRIMPEX ) tablet 100 mg        100 mg Oral Daily 01/02/24 0731     01/01/24 0600  piperacillin -tazobactam (ZOSYN ) IVPB 3.375 g  Status:  Discontinued        3.375 g 100 mL/hr over 30 Minutes Intravenous Every 6 hours 01/01/24 0430 01/01/24 0436   01/01/24 0600  piperacillin -tazobactam (ZOSYN ) IVPB 3.375 g  Status:  Discontinued        3.375 g 12.5 mL/hr over 240 Minutes Intravenous Every 8 hours 01/01/24 0436 01/02/24 0730   01/01/24 0000  piperacillin -tazobactam (ZOSYN ) IVPB 3.375 g        3.375 g 100 mL/hr over 30 Minutes Intravenous  Once 12/31/23 2350 01/01/24 0124       Current Facility-Administered Medications  Medication Dose Route Frequency Provider Last Rate Last Admin   0.9 %  sodium chloride  infusion   Intravenous Continuous Schermerhorn, Demetra V, MD 100 mL/hr at 01/01/24 1423 New Bag at 01/01/24 1423   ondansetron  (ZOFRAN ) tablet 4 mg  4 mg Oral Q6H PRN Schermerhorn, Demetra V, MD       oxyCODONE  (Oxy IR/ROXICODONE ) immediate release tablet 5 mg  5 mg Oral Q4H PRN Schermerhorn, Demetra V, MD   5 mg at 01/01/24 2128   trimethoprim  (TRIMPEX ) tablet 100 mg  100 mg Oral Daily Schermerhorn, Demetra V, MD       Objective: Vital signs in last 24 hours: Temp:  [98.2 F (36.8 C)-98.9 F (37.2 C)] 98.5 F (36.9 C) (02/05 0720) Pulse Rate:  [68-110] 85 (02/05 0720) Resp:  [16-18] 18 (02/05 0720) BP: (95-118)/(58-76) 100/65 (02/05 0720) SpO2:  [97 %-100 %] 98 % (02/05  0720)  Intake/Output from previous day: 02/04 0701 - 02/05 0700 In: 127.5 [IV Piggyback:127.5] Out: 4150 [Urine:4150] Intake/Output this shift: No intake/output data recorded.  Physical Exam Vitals and nursing note reviewed.  Constitutional:      General: She is not in acute distress.    Appearance: She is not ill-appearing, toxic-appearing or diaphoretic.  HENT:     Head: Normocephalic and atraumatic.  Pulmonary:     Effort: Pulmonary effort is normal. No respiratory distress.  Abdominal:     Comments: Abdomen is soft with some RLQ tenderness, without rigidity, rebound, or guarding.  Skin:    General: Skin is warm and dry.  Neurological:     Mental Status: She is alert and oriented to person, place, and time.  Psychiatric:        Mood and Affect: Mood normal.        Behavior: Behavior normal.    Lab Results:  Recent Labs    12/31/23 2238 01/02/24 0533  WBC 22.3* 5.4  HGB 14.6 10.5*  HCT 43.6 31.5*  PLT 426* 223   BMET Recent Labs    12/31/23 2238 01/02/24 0533  NA 133* 137  K 3.9 3.8  CL 98 107  CO2 19* 25  GLUCOSE 238* 92  BUN 14 9  CREATININE 0.94 0.72  CALCIUM  9.0 8.2*  Studies/Results: CT CYSTOGRAM ABD/PELVIS Result Date: 01/01/2024 CLINICAL DATA:  28 year old female status post recent hysterectomy. Nonspecific small volume of free fluid near the liver and spleen on routine CT Abdomen and Pelvis earlier today. Follow-up CT cystogram. EXAM: CT CYSTOGRAM (CT ABDOMEN AND PELVIS WITH CONTRAST) TECHNIQUE: Multi-detector CT imaging through the abdomen and pelvis was performed after dilute contrast had been introduced into the bladder for the purposes of performing CT cystography. RADIATION DOSE REDUCTION: This exam was performed according to the departmental dose-optimization program which includes automated exposure control, adjustment of the mA and/or kV according to patient size and/or use of iterative reconstruction technique. CONTRAST:  OMNIPAQUE   IOHEXOL  300 MG/ML  SOLN COMPARISON:  CT Abdomen and Pelvis 0008 hours today. FINDINGS: Lower chest: Lower lung volumes with symmetric mild dependent atelectasis. No pericardial or pleural effusion. Hepatobiliary: Small volume of perihepatic fluid not significantly changed. Otherwise stable and negative liver and gallbladder. Pancreas: Negative. Spleen: Less low-density perisplenic fluid now, read distributed from the earlier CT. Otherwise stable and negative spleen. Adrenals/Urinary Tract: Normal adrenal glands. Symmetric renal contrast enhancement and contrast excretion to nondilated ureters. Intermittent left ureter contrast visible into the pelvis. Delayed renal images also demonstrate appropriate, symmetric renal contrast excretion. CT cystogram was performed with indwelling Foley catheter. Good bladder contrast opacification. And on both initial and delayed images there is a small posterior midline bladder leak of contrast demonstrated (series 2, image 75 and series 7, image 63) with contrast tracking from the leak site posteriorly into the cul-de-sac. Initial layering contrast within the cul-de-sac on series 2, image 69 is mildly increased in volume on the delayed images (series 7, image 58). Stomach/Bowel: Stable nondilated large bowel. Normal gas containing appendix redemonstrated on series 2, image 64. No free intraperitoneal air is identified and the punctate focus of gas seen in the lower small bowel mesentery earlier today is no longer identified. Vascular/Lymphatic: Major arterial structures appear patent and normal. Normal caliber abdominal aorta. Portal venous system is patent. Lower abdominal and pelvic central venous structures also appear patent. No lymphadenopathy identified. Reproductive: Stable parametrial postoperative changes. Other: Previously demonstrated pelvic free fluid now partially opacified (series 2, image 69) as detailed above. Musculoskeletal: Stable.  No acute osseous abnormality  identified. IMPRESSION: 1. Cystogram is Positive for Bladder Leak/Tear located posteriorly near the midline on series 2, image 75. Small but progressive volume of extravasated intravesical contrast into the cul-de-sac on the delayed images. 2. No associated obstructive uropathy. Unremarkable kidneys and visible ureters. 3. And otherwise stable postoperative appearance of the abdomen and pelvis. Electronically Signed   By: VEAR Hurst M.D.   On: 01/01/2024 05:08   DG Chest Port 1 View Result Date: 01/01/2024 CLINICAL DATA:  Sepsis EXAM: PORTABLE CHEST 1 VIEW COMPARISON:  None Available. FINDINGS: The heart size and mediastinal contours are within normal limits. Both lungs are clear. The visualized skeletal structures are unremarkable. IMPRESSION: No active disease. Electronically Signed   By: Franky Stanford M.D.   On: 01/01/2024 00:39   CT ABDOMEN PELVIS W CONTRAST Result Date: 01/01/2024 CLINICAL DATA:  Right lower quadrant pain recent partial hysterectomy, pain after Foley removed EXAM: CT ABDOMEN AND PELVIS WITH CONTRAST TECHNIQUE: Multidetector CT imaging of the abdomen and pelvis was performed using the standard protocol following bolus administration of intravenous contrast. RADIATION DOSE REDUCTION: This exam was performed according to the departmental dose-optimization program which includes automated exposure control, adjustment of the mA and/or kV according to patient size and/or use of iterative reconstruction  technique. CONTRAST:  OMNIPAQUE  IOHEXOL  300 MG/ML  SOLN COMPARISON:  None Available. FINDINGS: Lower chest: Lung bases are clear Hepatobiliary: No focal liver abnormality is seen. No gallstones, gallbladder wall thickening, or biliary dilatation. Pancreas: Unremarkable. No pancreatic ductal dilatation or surrounding inflammatory changes. Spleen: Normal in size without focal abnormality. Adrenals/Urinary Tract: Adrenal glands are normal. Kidneys show no hydronephrosis. Normal excretion of  contrast. Catheter within the bladder which is decompressed. Only small volume of excreted contrast within the bladder on delayed views. Stomach/Bowel: Stomach nonenlarged. No dilated small bowel. No acute bowel wall thickening. Negative appendix Vascular/Lymphatic: No significant vascular findings are present. No enlarged abdominal or pelvic lymph nodes. Reproductive: Status post hysterectomy.  No suspicious adnexal mass Other: Small focus of gas in the mesentery, series 2, image 47 likely postoperative gas. Small volume free fluid near the liver and spleen with small moderate fluid in the pelvis, water  density Musculoskeletal: No acute or significant osseous findings. IMPRESSION: 1. Status post hysterectomy. Small focus of gas in the mesentery likely postoperative. Small volume free fluid near the liver and spleen with small to moderate fluid in the pelvis. This is indeterminate for postoperative fluid or urine ascites given clinical history. Foley catheter is present in decompressed urinary bladder, no frank extravasation of contrast on delayed views but only minimal contrast in the bladder, follow-up CT cystogram could be obtained if deemed clinically appropriate. Electronically Signed   By: Luke Bun M.D.   On: 01/01/2024 00:31   Assessment & Plan: 28 year old female s/p transvaginal hysterectomy with bilateral salpingectomy on 12/25/2023 admitted with sepsis criteria possibly related to cystotomy, being managed conservatively.  She is clinically improving today.  I think her drop in hemoglobin is likely to be dilutional in the absence of gross hematuria or abnormal abdominal findings on exam.  We discussed that she will require Foley placement for about 2 weeks and we will plan for outpatient Foley removal with cystogram prior.  She is in agreement with this plan.    Recommendations: -Follow cultures and continue antibiotics per primary team; after she completes treatment course we recommend daily  trimethoprim  100mg  for UTI prevention x2 weeks  -Continue Foley catheter -Outpatient Foley removal in 2 weeks with cystogram prior, will arrange -Okay for discharge from the urologic perspective  Lucie Hones, PA-C 01/02/2024

## 2024-01-05 LAB — CULTURE, BLOOD (SINGLE)
Culture: NO GROWTH
Special Requests: ADEQUATE

## 2024-01-06 LAB — CULTURE, BLOOD (SINGLE): Culture: NO GROWTH

## 2024-01-09 ENCOUNTER — Encounter: Payer: Self-pay | Admitting: Family Medicine

## 2024-01-09 ENCOUNTER — Ambulatory Visit: Payer: 59 | Admitting: Family Medicine

## 2024-01-09 VITALS — BP 108/76 | HR 73 | Wt 137.0 lb

## 2024-01-09 DIAGNOSIS — Z90710 Acquired absence of both cervix and uterus: Secondary | ICD-10-CM

## 2024-01-09 DIAGNOSIS — N9981 Other intraoperative complications of genitourinary system: Secondary | ICD-10-CM

## 2024-01-09 NOTE — Assessment & Plan Note (Addendum)
With indwelling foley, hopefully, will heal with a bit more time. On ppx abx, though all cultures were negative.

## 2024-01-09 NOTE — Progress Notes (Signed)
Follow up, Post op and ED visit  Denies any concerns

## 2024-01-09 NOTE — Progress Notes (Signed)
    Subjective:    Patient ID: Caroline [name] is a 28 y.o. female presenting with Follow-up  on 01/09/2024  HPI: Patient is 2 weeks out form an TVH with incidental cystotomy. Last week, we removed her catheter and she ended up back in the hospital with possible sepsis, and likely small extravasation from her bladder. Her foley was replaced, urology saw her and recommended foley for 2 more weeks, with x-ray prior to removal.  Review of Systems  Constitutional:  Negative for chills and fever.  Respiratory:  Negative for shortness of breath.   Cardiovascular:  Negative for chest pain.  Gastrointestinal:  Negative for abdominal pain, nausea and vomiting.  Genitourinary:  Negative for dysuria.  Skin:  Negative for rash.      Objective:    BP 108/76   Pulse 73   Wt 137 lb (62.1 kg)   LMP 12/07/2023 (Approximate)   BMI 25.06 kg/m  Physical Exam Exam conducted with a chaperone present.  Constitutional:      General: She is not in acute distress.    Appearance: She is well-developed.  HENT:     Head: Normocephalic and atraumatic.  Eyes:     General: No scleral icterus. Cardiovascular:     Rate and Rhythm: Normal rate.  Pulmonary:     Effort: Pulmonary effort is normal.  Abdominal:     Palpations: Abdomen is soft.  Musculoskeletal:     Cervical back: Neck supple.  Skin:    General: Skin is warm and dry.  Neurological:     Mental Status: She is alert and oriented to person, place, and time.         Assessment & Plan:   Problem List Items Addressed This Visit       Unprioritized   Status post hysterectomy on 12/25/23   Has some constipation, on Miralax, add back colace Doing well, will check cuff at next visit.      Intraoperative bladder injury - Primary   With indwelling foley, hopefully, will heal with a bit more time. On ppx abx, though all cultures were negative.      .  Return in about 4 weeks (around 02/06/2024).  Reva Bores, MD 01/09/2024 10:49  AM

## 2024-01-09 NOTE — Assessment & Plan Note (Signed)
Has some constipation, on Miralax, add back colace Doing well, will check cuff at next visit.

## 2024-01-15 ENCOUNTER — Ambulatory Visit: Payer: 59 | Admitting: Physician Assistant

## 2024-01-15 ENCOUNTER — Ambulatory Visit
Admit: 2024-01-15 | Discharge: 2024-01-15 | Disposition: A | Discharge status: Home / Self Care | Payer: 59 | Attending: Physician Assistant | Admitting: Physician Assistant

## 2024-01-15 VITALS — BP 118/76 | HR 98 | Ht 62.0 in | Wt 137.0 lb

## 2024-01-15 DIAGNOSIS — N9981 Other intraoperative complications of genitourinary system: Secondary | ICD-10-CM | POA: Diagnosis present

## 2024-01-15 DIAGNOSIS — Z90710 Acquired absence of both cervix and uterus: Secondary | ICD-10-CM | POA: Diagnosis not present

## 2024-01-15 DIAGNOSIS — N39498 Other specified urinary incontinence: Secondary | ICD-10-CM | POA: Diagnosis present

## 2024-01-15 MED ORDER — IOTHALAMATE MEGLUMINE 17.2 % UR SOLN
250.0000 mL | Freq: Once | URETHRAL | Status: AC | PRN
  Administered 2024-01-15: 175 mL via INTRAVESICAL

## 2024-01-15 NOTE — Progress Notes (Signed)
 01/15/2024 3:11 PM   Caroline [name] 09/02/1996 [id number]  CC: Chief Complaint  Patient presents with   Other   HPI: Caroline [name] is a 28 y.o. female with recent 3 of transvaginal hysterectomy with bilateral salpingectomy on 12/25/2023 with intraoperative cystotomy, managed conservatively, who presents today for outpatient follow-up.   Today she reports she remains on daily suppressive trimethoprim, last dose due later today.  She has no acute concerns today.  Follow-up cystogram this morning with no evidence of persistent bladder leak.  PMH: Past Medical History:  Diagnosis Date   Abnormal uterine bleeding (AUB)    Anxiety, generalized 08/06/2023   CIN III (cervical intraepithelial neoplasia III) 01/2023   colpo 01-31-2023  CIN 3/  LEEP 02-28-2023/  pap/ bx 09-05-2023  HGSIL, CIN3   Headache disorder 09/04/2018   History of child affected by Down syndrome    History of gestational diabetes    Hypertriglyceridemia 07/20/2021   Irregular menstrual cycle 12/14/2023   Migraines    per pt does not take meds   Situational anxiety    12-14-2023  "anxious about being put to sleep for surgery"    Surgical History: Past Surgical History:  Procedure Laterality Date   CESAREAN SECTION N/A 08/11/2017   Procedure: CESAREAN SECTION;  Surgeon: Montross Bing, MD;  Location: [facility];  Service: Obstetrics;  Laterality: N/A;   VAGINAL HYSTERECTOMY N/A 12/25/2023   Procedure: HYSTERECTOMY VAGINAL;  Surgeon: Reva Bores, MD;  Location: [facility];  Service: Gynecology;  Laterality: N/A;   WISDOM TOOTH EXTRACTION Bilateral     Home Medications:  Allergies as of 01/15/2024       Reactions   Vicks Nyquil Cold & Flu Night [dm-apap-cpm] Rash, Other (See Comments)   Red in face and hot        Medication List        Accurate as of January 15, 2024  3:11 PM. If you have any questions, ask your nurse or doctor.          acetaminophen 500  MG tablet Commonly known as: TYLENOL Take 2 tablets (1,000 mg total) by mouth every 6 (six) hours as needed for mild pain (pain score 1-3).   GaviLAX 17 GM/SCOOP powder Generic drug: polyethylene glycol powder Take by mouth once.   oxybutynin 5 MG tablet Commonly known as: DITROPAN Take 1 tablet (5 mg total) by mouth every 8 (eight) hours as needed for up to 14 days for bladder spasms.   promethazine 25 MG tablet Commonly known as: PHENERGAN Take 1 tablet (25 mg total) by mouth every 6 (six) hours as needed for nausea.   trimethoprim 100 MG tablet Commonly known as: TRIMPEX Take 1 tablet (100 mg total) by mouth daily for 14 days.        Allergies:  Allergies  Allergen Reactions   Vicks Nyquil Cold & Flu Night [Dm-Apap-Cpm] Rash and Other (See Comments)    Red in face and hot    Family History: Family History  Problem Relation Age of Onset   Kidney failure Mother    Drug abuse Mother    Heart disease Father    Drug abuse Maternal Grandmother    Heart disease Paternal Grandfather    Cancer Paternal Grandfather    Hypertension Paternal Grandfather    Lung cancer Paternal Grandfather    Diabetes Paternal Grandfather    Heart disease Other    Asthma Neg Hx    Birth defects Neg Hx  Social History:   reports that she has never smoked. She has been exposed to tobacco smoke. She has never used smokeless tobacco. She reports current alcohol use of about 7.0 - 14.0 standard drinks of alcohol per week. She reports that she does not use drugs.  Physical Exam: BP 118/76   Pulse 98   Ht 5\' 2"  (1.575 m)   Wt 137 lb (62.1 kg)   LMP 12/07/2023 (Approximate)   BMI 25.06 kg/m   Constitutional:  Alert and oriented, no acute distress, nontoxic appearing HEENT: [place], AT Cardiovascular: No clubbing, cyanosis, or edema Respiratory: Normal respiratory effort, no increased work of breathing Skin: No rashes, bruises or suspicious lesions Neurologic: Grossly intact, no focal  deficits, moving all 4 extremities Psychiatric: Normal mood and affect  Pertinent Imaging: Cystogram, 01/15/2024: CLINICAL DATA:  Follow-up postop bladder leak 3 weeks status post hysterectomy.   EXAM: CYSTOGRAM   TECHNIQUE: After catheterization of the urinary bladder following sterile technique the bladder was filled with 175 mL Cysto-Hypaque 30% by drip infusion. Serial spot images were obtained during bladder filling and post draining.   FLUOROSCOPY: Radiation Exposure Index (as provided by the fluoroscopic device): 15.8 mGy Kerma   COMPARISON:  None Available.   FINDINGS: The urinary bladder is normal in size and appearance. Smooth contour of the bladder is seen, without No evidence of contrast leak or extravasation. No vesicoureteral reflux of contrast was seen.   IMPRESSION: Normal cystogram.  No evidence of bladder perforation/leak.     Electronically Signed   By: Danae Orleans M.D.   On: 01/15/2024 14:55  I personally reviewed the images referenced above and note no evidence of persistent bladder perforation.  Catheter Removal  Patient is present today for a catheter removal.  9ml of water was drained from the balloon. A 14FR foley cath was removed from the bladder, no complications were noted. Patient tolerated well.  Performed by: Carman Ching, PA-C   Assessment & Plan:   1. Intraoperative bladder injury (Primary) Cystogram today shows resolution of her previously seen bladder perforation.  Foley removed as above.  I counseled her to complete trimethoprim as prescribed.  She may follow-up as needed.  Return if symptoms worsen or fail to improve.  Carman Ching, PA-C  [facility] [place] [address] [place], [place] [address] [phone]

## 2024-02-13 ENCOUNTER — Ambulatory Visit: Payer: 59 | Admitting: Family Medicine

## 2024-02-13 ENCOUNTER — Encounter: Payer: Self-pay | Admitting: Family Medicine

## 2024-02-13 VITALS — BP 110/72 | HR 86 | Wt 134.0 lb

## 2024-02-13 DIAGNOSIS — N879 Dysplasia of cervix uteri, unspecified: Secondary | ICD-10-CM

## 2024-02-13 DIAGNOSIS — Z90710 Acquired absence of both cervix and uterus: Secondary | ICD-10-CM

## 2024-02-13 DIAGNOSIS — N9981 Other intraoperative complications of genitourinary system: Secondary | ICD-10-CM

## 2024-02-13 NOTE — Assessment & Plan Note (Signed)
 Healed up--with normal function.

## 2024-02-13 NOTE — Progress Notes (Addendum)
    Subjective:    Patient ID: Caroline [name] is a 28 y.o. female presenting with Follow-up  on 02/13/2024  HPI: She is s/p TVH with cystotomy 12/25/2023. Wore catheter for 7 days, and upon removal, injury still present. Then wore catheter for 14 days and had cystogram and noted her bladder injury was healed. Bladder and bowel are funcitoning normally. Denies bleeding.  Daughter with flu. She has had several URIs. Cough is making her pain worse.   Review of Systems  Constitutional:  Negative for chills and fever.  Respiratory:  Negative for shortness of breath.   Cardiovascular:  Negative for chest pain.  Gastrointestinal:  Negative for abdominal pain, nausea and vomiting.  Genitourinary:  Negative for dysuria.  Skin:  Negative for rash.      Objective:    BP 110/72   Pulse 86   Wt 134 lb (60.8 kg)   LMP 12/07/2023 (Approximate)   BMI 24.51 kg/m  Physical Exam Exam conducted with a chaperone present.  Constitutional:      General: She is not in acute distress.    Appearance: She is well-developed.  HENT:     Head: Normocephalic and atraumatic.  Eyes:     General: No scleral icterus. Cardiovascular:     Rate and Rhythm: Normal rate.  Pulmonary:     Effort: Pulmonary effort is normal.  Abdominal:     Palpations: Abdomen is soft.  Genitourinary:    Comments: Cuff is closed and well healed. Musculoskeletal:     Cervical back: Neck supple.  Skin:    General: Skin is warm and dry.  Neurological:     Mental Status: She is alert and oriented to person, place, and time.       Assessment & Plan:   Problem List Items Addressed This Visit       Unprioritized   Status post hysterectomy on 12/25/23   Vagina is healed and all suture material is gone. May resume normal physical activity.       Intraoperative bladder injury - Primary   Healed up--with normal function.         Return in about 1 year (around 02/12/2025) for a CPE.  Reva Bores,  MD 02/13/2024 9:21 AM

## 2024-02-13 NOTE — Assessment & Plan Note (Signed)
 Vagina is healed and all suture material is gone. May resume normal physical activity.

## 2024-02-13 NOTE — Progress Notes (Signed)
 Follow up.

## 2024-02-14 ENCOUNTER — Encounter: Payer: Self-pay | Admitting: Family Medicine

## 2024-03-06 ENCOUNTER — Ambulatory Visit (INDEPENDENT_AMBULATORY_CARE_PROVIDER_SITE_OTHER): Admitting: Physician Assistant

## 2024-03-06 VITALS — BP 145/82 | HR 109 | Ht 62.5 in | Wt 134.5 lb

## 2024-03-06 DIAGNOSIS — B3731 Acute candidiasis of vulva and vagina: Secondary | ICD-10-CM

## 2024-03-06 DIAGNOSIS — N3941 Urge incontinence: Secondary | ICD-10-CM

## 2024-03-06 DIAGNOSIS — N39498 Other specified urinary incontinence: Secondary | ICD-10-CM

## 2024-03-06 LAB — MICROSCOPIC EXAMINATION: Epithelial Cells (non renal): >10 /HPF — AB (ref 0–10)

## 2024-03-06 LAB — URINALYSIS, COMPLETE
Bilirubin, UA: NEGATIVE
Glucose, UA: NEGATIVE
Ketones, UA: NEGATIVE
Leukocytes,UA: NEGATIVE
Nitrite, UA: NEGATIVE
Protein,UA: NEGATIVE
RBC, UA: NEGATIVE
Specific Gravity, UA: 1.02 (ref 1.005–1.030)
Urobilinogen, Ur: 4 mg/dL — ABNORMAL HIGH (ref 0.2–1.0)
pH, UA: 7 (ref 5.0–7.5)

## 2024-03-06 LAB — BLADDER SCAN AMB NON-IMAGING: Scan Result: 62

## 2024-03-06 MED ORDER — OXYBUTYNIN CHLORIDE ER 10 MG PO TB24: 10.0000 mg | ORAL_TABLET | Freq: Every day | ORAL | 0 refills | Status: DC

## 2024-03-06 MED ORDER — FLUCONAZOLE 150 MG PO TABS: 150.0000 mg | ORAL_TABLET | Freq: Once | ORAL | 0 refills | Status: AC

## 2024-03-06 NOTE — Progress Notes (Signed)
 03/06/2024 4:27 PM   Caroline [name] [birth year] [id number]  CC: Chief Complaint  Patient presents with   Urinary Incontinence   HPI: Caroline [name] is a 28 y.o. female with PMH transvaginal hysterectomy with bilateral salpingectomy on 12/25/2023 with intraoperative cystotomy, managed conservatively, with follow-up Foley removal with me on 01/15/2024 who presents today for evaluation of urinary leakage.  Today she reports a recent history of worsening urinary urgency and leakage.  This has become particularly bothersome within the past week and she is now wearing diapers all the time to manage her leakage.  She thinks she is getting a yeast infection from the diapers.  She denies any vaginal pressure or bulging.  She has intermittent constipation, none currently.  She denies dry mouth or dry eye at baseline.  In-office UA today positive for 4.0 EU/DL urobilinogen; urine microscopy with >10 epithelial cells/hpf.  PVR 62 mL.  PMH: Past Medical History:  Diagnosis Date   Abnormal uterine bleeding (AUB)    Anxiety, generalized 08/06/2023   CIN III (cervical intraepithelial neoplasia III) 01/2023   colpo 01-31-2023  CIN 3/  LEEP 02-28-2023/  pap/ bx 09-05-2023  HGSIL, CIN3   Headache disorder 09/04/2018   History of child affected by Down syndrome    History of gestational diabetes    Hypertriglyceridemia 07/20/2021   Irregular menstrual cycle 12/14/2023   Migraines    per pt does not take meds   Situational anxiety    12-14-2023  "anxious about being put to sleep for surgery"    Surgical History: Past Surgical History:  Procedure Laterality Date   CESAREAN SECTION N/A 08/11/2017   Procedure: CESAREAN SECTION;  Surgeon: Crenshaw Bing, MD;  Location: [facility] BIRTHING SUIT[facility];  Service: Obstetrics;  Laterality: N/A;   VAGINAL HYSTERECTOMY N/A 12/25/2023   Procedure: HYSTERECTOMY VAGINAL;  Surgeon: Reva Bores, MD;  Location: [facility];  Service: Gynecology;   Laterality: N/A;   WISDOM TOOTH EXTRACTION Bilateral     Home Medications:  Allergies as of 03/06/2024       Reactions   Vicks Nyquil Cold & Flu Night [dm-apap-cpm] Rash, Other (See Comments)   Red in face and hot        Medication List        Accurate as of March 06, 2024  4:27 PM. If you have any questions, ask your nurse or doctor.          STOP taking these medications    GaviLAX 17 GM/SCOOP powder Generic drug: polyethylene glycol powder   promethazine 25 MG tablet Commonly known as: PHENERGAN       TAKE these medications    fluconazole 150 MG tablet Commonly known as: DIFLUCAN Take 1 tablet (150 mg total) by mouth once for 1 dose.   oxybutynin 10 MG 24 hr tablet Commonly known as: DITROPAN-XL Take 1 tablet (10 mg total) by mouth at bedtime.        Allergies:  Allergies  Allergen Reactions   Vicks Nyquil Cold & Flu Night [Dm-Apap-Cpm] Rash and Other (See Comments)    Red in face and hot    Family History: Family History  Problem Relation Age of Onset   Kidney failure Mother    Drug abuse Mother    Heart disease Father    Drug abuse Maternal Grandmother    Heart disease Paternal Grandfather    Cancer Paternal Grandfather    Hypertension Paternal Grandfather    Lung cancer Paternal Grandfather  Diabetes Paternal Grandfather    Heart disease Other    Asthma Neg Hx    Birth defects Neg Hx     Social History:   reports that she has never smoked. She has been exposed to tobacco smoke. She has never used smokeless tobacco. She reports current alcohol use of about 7.0 - 14.0 standard drinks of alcohol per week. She reports that she does not use drugs.  Physical Exam: BP (!) 145/82   Pulse (!) 109   Ht 5' 2.5" (1.588 m)   Wt 134 lb 8 oz (61 kg)   LMP 12/07/2023 (Approximate)   BMI 24.21 kg/m   Constitutional:  Alert and oriented, no acute distress, nontoxic appearing HEENT: [place], AT Cardiovascular: No clubbing, cyanosis, or  edema Respiratory: Normal respiratory effort, no increased work of breathing Skin: No rashes, bruises or suspicious lesions Neurologic: Grossly intact, no focal deficits, moving all 4 extremities Psychiatric: Normal mood and affect  Laboratory Data: Results for orders placed or performed in visit on 03/06/24  Microscopic Examination   Collection Time: 03/06/24  3:33 PM   Urine  Result Value Ref Range   WBC, UA 0-5 0 - 5 /hpf   RBC, Urine 0-2 0 - 2 /hpf   Epithelial Cells (non renal) >10 (A) 0 - 10 /hpf   Crystals Present (A) N/A   Crystal Type Amorphous Sediment N/A   Bacteria, UA Few None seen/Few  Urinalysis, Complete   Collection Time: 03/06/24  3:33 PM  Result Value Ref Range   Specific Gravity, UA 1.020 1.005 - 1.030   pH, UA 7.0 5.0 - 7.5   Color, UA Yellow Yellow   Appearance Ur Clear Clear   Leukocytes,UA Negative Negative   Protein,UA Negative Negative/Trace   Glucose, UA Negative Negative   Ketones, UA Negative Negative   RBC, UA Negative Negative   Bilirubin, UA Negative Negative   Urobilinogen, Ur 4.0 (H) 0.2 - 1.0 mg/dL   Nitrite, UA Negative Negative   Microscopic Examination See below:   Bladder Scan (Post Void Residual) in office   Collection Time: 03/06/24  3:40 PM  Result Value Ref Range   Scan Result 62 ml    Assessment & Plan:   1. Urge incontinence of urine (Primary) UA bland and she is emptying appropriately.  Will start OAB med for her symptoms and see her back next month for symptom recheck and PVR.  May need to consider switching her to beta 3 agonist if antimuscarinics exacerbate her baseline constipation. - Urinalysis, Complete - Bladder Scan (Post Void Residual) in office - oxybutynin (DITROPAN-XL) 10 MG 24 hr tablet; Take 1 tablet (10 mg total) by mouth at bedtime.  Dispense: 30 tablet; Refill: 0  2. Vulvovaginal candidiasis Empiric Diflucan x 1 dose. - fluconazole (DIFLUCAN) 150 MG tablet; Take 1 tablet (150 mg total) by mouth once for 1  dose.  Dispense: 1 tablet; Refill: 0  Return in about 4 weeks (around 04/03/2024) for Symptom recheck with PVR.  Carman Ching, PA-C  [facility] [place] [address], [address] [place], [place] [address] [phone]

## 2024-03-26 ENCOUNTER — Ambulatory Visit
Admission: RE | Admit: 2024-03-26 | Discharge: 2024-03-26 | Disposition: A | Discharge status: Home / Self Care | Source: Ambulatory Visit | Attending: Physician Assistant | Admitting: Physician Assistant

## 2024-03-26 ENCOUNTER — Ambulatory Visit (INDEPENDENT_AMBULATORY_CARE_PROVIDER_SITE_OTHER): Admitting: Physician Assistant

## 2024-03-26 ENCOUNTER — Encounter: Payer: Self-pay | Admitting: Physician Assistant

## 2024-03-26 VITALS — BP 135/82 | HR 70 | Ht 62.5 in | Wt 134.0 lb

## 2024-03-26 DIAGNOSIS — R10813 Right lower quadrant abdominal tenderness: Secondary | ICD-10-CM | POA: Insufficient documentation

## 2024-03-26 DIAGNOSIS — N3941 Urge incontinence: Secondary | ICD-10-CM | POA: Diagnosis not present

## 2024-03-26 DIAGNOSIS — B3731 Acute candidiasis of vulva and vagina: Secondary | ICD-10-CM

## 2024-03-26 DIAGNOSIS — R32 Unspecified urinary incontinence: Secondary | ICD-10-CM

## 2024-03-26 LAB — URINALYSIS, COMPLETE
Bilirubin, UA: NEGATIVE
Glucose, UA: NEGATIVE
Ketones, UA: NEGATIVE
Leukocytes,UA: NEGATIVE
Nitrite, UA: NEGATIVE
Protein,UA: NEGATIVE
RBC, UA: NEGATIVE
Specific Gravity, UA: 1.015 (ref 1.005–1.030)
Urobilinogen, Ur: 0.2 mg/dL (ref 0.2–1.0)
pH, UA: 5.5 (ref 5.0–7.5)

## 2024-03-26 LAB — BLADDER SCAN AMB NON-IMAGING: Scan Result: 0

## 2024-03-26 LAB — MICROSCOPIC EXAMINATION: Bacteria, UA: NONE SEEN

## 2024-03-26 MED ORDER — FLUCONAZOLE 150 MG PO TABS: 150.0000 mg | ORAL_TABLET | Freq: Once | ORAL | 0 refills | Status: AC

## 2024-03-26 MED ORDER — IOHEXOL 9 MG/ML PO SOLN
500.0000 mL | ORAL | Status: AC
  Administered 2024-03-26 (×2): 500 mL via ORAL

## 2024-03-26 MED ORDER — IOHEXOL 300 MG/ML  SOLN
85.0000 mL | Freq: Once | INTRAMUSCULAR | Status: AC | PRN
  Administered 2024-03-26: 85 mL via INTRAVENOUS

## 2024-03-26 MED ORDER — GEMTESA 75 MG PO TABS: 75.0000 mg | ORAL_TABLET | Freq: Every day | ORAL | Status: DC

## 2024-03-26 NOTE — Addendum Note (Signed)
 Addended by: Ronetta Molla P on: 03/26/2024 12:12 PM   Modules accepted: Orders

## 2024-03-26 NOTE — Addendum Note (Signed)
 Addended by: Jomo Forand P on: 03/26/2024 04:30 PM   Modules accepted: Orders

## 2024-03-26 NOTE — Progress Notes (Addendum)
 03/26/2024 11:33 AM   Caroline [name] [birth year] [id number]  CC: Chief Complaint  Patient presents with   Medical Management of Chronic Issues   HPI: Caroline [name] is a 28 y.o. female with a recent history of transvaginal hysterectomy with bilateral salpingectomy on 12/25/2023 with intraoperative cystotomy, managed conservatively, who subsequently developed bothersome urinary urgency and incontinence who presents today for follow-up.   I saw her in clinic most recently 20 days ago and started her on oxybutynin  XL 10 mg daily.  She reached out via MyChart to share that she is having continued urinary leakage, pelvic cramping, and pain.  Today she reports continuous leakage with and without urge despite oxybutynin .  She has noticed dry mouth and dry eye on oxybutynin .  She is also having sharp pelvic pain and abdominal tenderness in her suprapubic region and RLQ.  She denies fever, chills, nausea, vomiting, constipation, saddle anesthesia, or fecal incontinence.  In-office UA and microscopy today pan negative. PVR 0mL.  PMH: Past Medical History:  Diagnosis Date   Abnormal uterine bleeding (AUB)    Anxiety, generalized 08/06/2023   CIN III (cervical intraepithelial neoplasia III) 01/2023   colpo 01-31-2023  CIN 3/  LEEP 02-28-2023/  pap/ bx 09-05-2023  HGSIL, CIN3   Headache disorder 09/04/2018   History of child affected by Down syndrome    History of gestational diabetes    Hypertriglyceridemia 07/20/2021   Irregular menstrual cycle 12/14/2023   Migraines    per pt does not take meds   Situational anxiety    12-14-2023  "anxious about being put to sleep for surgery"    Surgical History: Past Surgical History:  Procedure Laterality Date   CESAREAN SECTION N/A 08/11/2017   Procedure: CESAREAN SECTION;  Surgeon: Raynell Caller, MD;  Location: [facility] BIRTHING SUITES;  Service: Obstetrics;  Laterality: N/A;   VAGINAL HYSTERECTOMY N/A 12/25/2023   Procedure: HYSTERECTOMY  VAGINAL;  Surgeon: Granville Layer, MD;  Location: [facility];  Service: Gynecology;  Laterality: N/A;   WISDOM TOOTH EXTRACTION Bilateral     Home Medications:  Allergies as of 03/26/2024       Reactions   Vicks Nyquil Cold & Flu Night [dm-apap-cpm] Rash, Other (See Comments)   Red in face and hot        Medication List        Accurate as of March 26, 2024 11:33 AM. If you have any questions, ask your nurse or doctor.          oxybutynin  10 MG 24 hr tablet Commonly known as: DITROPAN -XL Take 1 tablet (10 mg total) by mouth at bedtime.        Allergies:  Allergies  Allergen Reactions   Vicks Nyquil Cold & Flu Night [Dm-Apap-Cpm] Rash and Other (See Comments)    Red in face and hot    Family History: Family History  Problem Relation Age of Onset   Kidney failure Mother    Drug abuse Mother    Heart disease Father    Drug abuse Maternal Grandmother    Heart disease Paternal Grandfather    Cancer Paternal Grandfather    Hypertension Paternal Grandfather    Lung cancer Paternal Grandfather    Diabetes Paternal Grandfather    Heart disease Other    Asthma Neg Hx    Birth defects Neg Hx     Social History:   reports that she has never smoked. She has been exposed to tobacco smoke. She has never  used smokeless tobacco. She reports current alcohol  use of about 7.0 - 14.0 standard drinks of alcohol  per week. She reports that she does not use drugs.  Physical Exam: BP 135/82   Pulse 70   Ht 5' 2.5" (1.588 m)   Wt 134 lb (60.8 kg)   LMP 12/07/2023 (Approximate)   BMI 24.12 kg/m   Constitutional:  Alert and oriented, no acute distress, nontoxic appearing HEENT: [place], AT Cardiovascular: No clubbing, cyanosis, or edema Respiratory: Normal respiratory effort, no increased work of breathing GI: Abdomen is soft with suprapubic and RLQ tenderness without rigidity or rebound GU: No cystocele. Continuous urinary leakage. Skin: No rashes, bruises or  suspicious lesions Neurologic: Grossly intact, no focal deficits, moving all 4 extremities Psychiatric: Normal mood and affect  Laboratory Data: Results for orders placed or performed in visit on 03/26/24  Microscopic Examination   Collection Time: 03/26/24 11:18 AM   Urine  Result Value Ref Range   WBC, UA 0-5 0 - 5 /hpf   RBC, Urine 0-2 0 - 2 /hpf   Epithelial Cells (non renal) 0-10 0 - 10 /hpf   Bacteria, UA None seen None seen/Few  Urinalysis, Complete   Collection Time: 03/26/24 11:18 AM  Result Value Ref Range   Specific Gravity, UA 1.015 1.005 - 1.030   pH, UA 5.5 5.0 - 7.5   Color, UA Yellow Yellow   Appearance Ur Clear Clear   Leukocytes,UA Negative Negative   Protein,UA Negative Negative/Trace   Glucose, UA Negative Negative   Ketones, UA Negative Negative   RBC, UA Negative Negative   Bilirubin, UA Negative Negative   Urobilinogen, Ur 0.2 0.2 - 1.0 mg/dL   Nitrite, UA Negative Negative   Microscopic Examination See below:   Bladder Scan (Post Void Residual) in office   Collection Time: 03/26/24 11:21 AM  Result Value Ref Range   Scan Result 0    Assessment & Plan:   1. Urinary incontinence, unspecified type (Primary) Postoperative urinary incontinence with features of urgency and continuous leakage, possibly stress as well.  She is emptying appropriately and UA is bland today.  No evidence of cystocele.  I do think she is having an unusual amount of urinary leakage following hysterectomy, question relation to #2 below.  Stop oxybutynin  due to inefficacy + side effects.  If CT as below is negative for intra-abdominal pathology, will consider follow-up with Dr. MacDiarmid versus urogyn for consideration of urodynamics.  ADDENDUM: Patient has severe urinary incontinence and is unable to hold her bladder, will require incontinence supplies to manage this. - Bladder Scan (Post Void Residual) in office - Urinalysis, Complete  2. Right lower quadrant abdominal  tenderness without rebound tenderness Physical exam today was notable for marked tenderness in the RLQ and suprapubic regions.  Question possible intra-abdominal abscess, however this would be unusual in the absence of fevers.  Will obtain CTAP with contrast for further evaluation.  We discussed that she may need to follow-up with GI and based on results. - CT ABDOMEN PELVIS W CONTRAST; Future  ADDENDUM: I contacted the patient via telephone to discuss her CT scan results.  No acute findings and she has a small focus of gas within her bladder, unclear if this was related to prolonged Foley catheterization following hysterectomy versus possible vesicovaginal fistula.  I am leaving her samples of Gemtesa  at the front desk, will have her see Dr. Clarke Crouch on Monday for further evaluation, and we discussed trying a tampon test in the meantime to  see if this impacts her urinary leakage.  She also requested a dose of empiric Diflucan , and I sent this in as well.  Return for Will call with results.  Kathreen Pare, PA-C  [facility] [place] [address] [place], [place] [address] [phone]

## 2024-03-31 ENCOUNTER — Ambulatory Visit (INDEPENDENT_AMBULATORY_CARE_PROVIDER_SITE_OTHER): Admitting: Urology

## 2024-03-31 VITALS — BP 100/70 | HR 98 | Ht 62.0 in | Wt 132.0 lb

## 2024-03-31 DIAGNOSIS — N3946 Mixed incontinence: Secondary | ICD-10-CM

## 2024-03-31 DIAGNOSIS — R32 Unspecified urinary incontinence: Secondary | ICD-10-CM

## 2024-03-31 LAB — URINALYSIS, COMPLETE
Bilirubin, UA: NEGATIVE
Glucose, UA: NEGATIVE
Leukocytes,UA: NEGATIVE
Nitrite, UA: NEGATIVE
Protein,UA: NEGATIVE
RBC, UA: NEGATIVE
Specific Gravity, UA: 1.03 (ref 1.005–1.030)
Urobilinogen, Ur: 1 mg/dL (ref 0.2–1.0)
pH, UA: 6 (ref 5.0–7.5)

## 2024-03-31 LAB — MICROSCOPIC EXAMINATION
Bacteria, UA: NONE SEEN
RBC, Urine: NONE SEEN /HPF (ref 0–2)

## 2024-03-31 NOTE — Progress Notes (Addendum)
 03/31/2024 12:43 PM   Caroline [name] 11-23-1996 [id number]  Referring provider: Little Riff, MD [address] [facility] [place],  [place] [address]  No chief complaint on file.   HPI: Br: Patient saw my partner 2 months ago following a transvaginal hysterectomy complicated by small cystotomy.  Postoperatively she had a CT scan with moderate fluid in the pelvis and a bladder perforation.  She was treated with the sepsis protocol.  She was seen 1 week after the injury and was followed with a Foley catheter.  She was then treated for urgency incontinence with oxybutynin .  She was having right lower quadrant pain.  She was given Gemtesa  samples but did not pick them up  CT cystogram January 01, 2024 demonstrated small posterior midline bladder leak of contrast into the cul-de-sac.  She had a cystogram January 15, 2024 that was normal.  She had a CT scan of the abdomen pelvis for right lower quadrant pain March 26, 2024.  Kidneys normal.  Bladder normal.  Air within the bladder.   Today Patient describes leaking a little bit when she had the catheter in the following the bladder injury.  For the first week after taking out the catheter she had very mild leaking especially when she went from sitting to standing position.  Now she soaks 7 or more pads a day.  She has high-volume bedwetting.  She thinks she is leaking all the time.  She sometimes leaks with coughing sneezing and with urgency.  She says she does not leak without awareness but feels when she leaks  She timed voids every 1 hour to try to stay drier.  She gets up once or twice a night and the flow was good  Before her hysterectomy she was continent but always has had a frequent and urgent bladder  Patient thinks the leaking is from her bladder and not vagina  No history of bladder surgery or recurrent bladder infections or kidney stones  On pelvic examination she had mild hypermobility of the bladder neck and  negative cough test.  No prolapse.  I did a double speculum exam.  No urine in the vagina.  Tissues looked healthy   PMH: Past Medical History:  Diagnosis Date   Abnormal uterine bleeding (AUB)    Anxiety, generalized 08/06/2023   CIN III (cervical intraepithelial neoplasia III) 01/2023   colpo 01-31-2023  CIN 3/  LEEP 02-28-2023/  pap/ bx 09-05-2023  HGSIL, CIN3   Headache disorder 09/04/2018   History of child affected by Down syndrome    History of gestational diabetes    Hypertriglyceridemia 07/20/2021   Irregular menstrual cycle 12/14/2023   Migraines    per pt does not take meds   Situational anxiety    12-14-2023  "anxious about being put to sleep for surgery"    Surgical History: Past Surgical History:  Procedure Laterality Date   CESAREAN SECTION N/A 08/11/2017   Procedure: CESAREAN SECTION;  Surgeon: Raynell Caller, MD;  Location: [facility];  Service: Obstetrics;  Laterality: N/A;   VAGINAL HYSTERECTOMY N/A 12/25/2023   Procedure: HYSTERECTOMY VAGINAL;  Surgeon: Granville Layer, MD;  Location: [facility];  Service: Gynecology;  Laterality: N/A;   WISDOM TOOTH EXTRACTION Bilateral     Home Medications:  Allergies as of 03/31/2024       Reactions   Vicks Nyquil Cold & Flu Night [dm-apap-cpm] Rash, Other (See Comments)   Red in face and hot  Medication List        Accurate as of Mar 31, 2024 12:43 PM. If you have any questions, ask your nurse or doctor.          Gemtesa  75 MG Tabs Generic drug: Vibegron  Take 1 tablet (75 mg total) by mouth daily.        Allergies:  Allergies  Allergen Reactions   Vicks Nyquil Cold & Flu Night [Dm-Apap-Cpm] Rash and Other (See Comments)    Red in face and hot    Family History: Family History  Problem Relation Age of Onset   Kidney failure Mother    Drug abuse Mother    Heart disease Father    Drug abuse Maternal Grandmother    Heart disease Paternal Grandfather    Cancer  Paternal Grandfather    Hypertension Paternal Grandfather    Lung cancer Paternal Grandfather    Diabetes Paternal Grandfather    Heart disease Other    Asthma Neg Hx    Birth defects Neg Hx     Social History:  reports that she has never smoked. She has been exposed to tobacco smoke. She has never used smokeless tobacco. She reports current alcohol  use of about 7.0 - 14.0 standard drinks of alcohol  per week. She reports that she does not use drugs.  ROS:                                        Physical Exam: LMP 12/07/2023 (Approximate)   Constitutional:  Alert and oriented, No acute distress. HEENT: [place] AT, moist mucus membranes.  Trachea midline, no masses.   Laboratory Data: Lab Results  Component Value Date   WBC 5.4 01/02/2024   HGB 10.5 (L) 01/02/2024   HCT 31.5 (L) 01/02/2024   MCV 88.0 01/02/2024   PLT 223 01/02/2024    Lab Results  Component Value Date   CREATININE 0.72 01/02/2024    No results found for: "PSA"  No results found for: "TESTOSTERONE"  Lab Results  Component Value Date   HGBA1C 5.2 07/12/2021    Urinalysis    Component Value Date/Time   COLORURINE YELLOW (A) 12/31/2023 2238   APPEARANCEUR Clear 03/26/2024 1118   LABSPEC 1.012 12/31/2023 2238   PHURINE 5.0 12/31/2023 2238   GLUCOSEU Negative 03/26/2024 1118   HGBUR LARGE (A) 12/31/2023 2238   BILIRUBINUR Negative 03/26/2024 1118   KETONESUR NEGATIVE 12/31/2023 2238   PROTEINUR Negative 03/26/2024 1118   PROTEINUR 30 (A) 12/31/2023 2238   UROBILINOGEN negative (A) 01/18/2018 1107   NITRITE Negative 03/26/2024 1118   NITRITE NEGATIVE 12/31/2023 2238   LEUKOCYTESUR Negative 03/26/2024 1118   LEUKOCYTESUR NEGATIVE 12/31/2023 2238    Pertinent Imaging: Urine reviewed and sent for culture.  Chart reviewed  Assessment & Plan: Patient has severe incontinence following hysterectomy and bladder injury.  The history suggested a fistula but there is no objective  evidence today.  I will have her come back in the next few weeks on Gemtesa  for cystoscopy.  Call if urine culture is positive.  Proceed accordingly.  Order urodynamics if she does not have a fistula.  No evidence of bladder or ureteral fistula on CT scan with contrast  I will check a postvoid residual next time but it has been 0 in the past  I will add her onto my schedule in a month.  Call if culture positive  1. Urinary  incontinence, unspecified type (Primary)    No follow-ups on file.  Devorah Fonder, MD  [facility] [address], [address] [place], [place] [address] [phone]

## 2024-04-03 ENCOUNTER — Other Ambulatory Visit: Payer: Self-pay

## 2024-04-03 ENCOUNTER — Ambulatory Visit: Admitting: Physician Assistant

## 2024-04-03 DIAGNOSIS — R32 Unspecified urinary incontinence: Secondary | ICD-10-CM

## 2024-04-03 DIAGNOSIS — N3946 Mixed incontinence: Secondary | ICD-10-CM

## 2024-04-03 LAB — CULTURE, URINE COMPREHENSIVE

## 2024-04-03 MED ORDER — NITROFURANTOIN MACROCRYSTAL 100 MG PO CAPS
100.0000 mg | ORAL_CAPSULE | Freq: Two times a day (BID) | ORAL | 0 refills | Status: AC
Start: 2024-04-03 — End: 2024-04-10

## 2024-05-05 ENCOUNTER — Ambulatory Visit (INDEPENDENT_AMBULATORY_CARE_PROVIDER_SITE_OTHER): Admitting: Urology

## 2024-05-05 VITALS — BP 105/73 | HR 108 | Ht 62.5 in | Wt 132.6 lb

## 2024-05-05 DIAGNOSIS — N3946 Mixed incontinence: Secondary | ICD-10-CM | POA: Diagnosis not present

## 2024-05-05 LAB — MICROSCOPIC EXAMINATION: Bacteria, UA: NONE SEEN

## 2024-05-05 LAB — URINALYSIS, COMPLETE
Bilirubin, UA: NEGATIVE
Glucose, UA: NEGATIVE
Ketones, UA: NEGATIVE
Leukocytes,UA: NEGATIVE
Nitrite, UA: NEGATIVE
Protein,UA: NEGATIVE
RBC, UA: NEGATIVE
Specific Gravity, UA: 1.025 (ref 1.005–1.030)
Urobilinogen, Ur: 0.2 mg/dL (ref 0.2–1.0)
pH, UA: 6 (ref 5.0–7.5)

## 2024-05-05 NOTE — Progress Notes (Signed)
 05/05/2024 12:43 PM   Caroline [name] 07-06-1996 [id number]  Referring provider: Little Riff, MD [address] [facility] [address] [facility] [place],  [place] [address]  Chief Complaint  Patient presents with   Cysto    HPI: Br: Patient saw my partner 2 months ago following a transvaginal hysterectomy complicated by small cystotomy.  Postoperatively she had a CT scan with moderate fluid in the pelvis and a bladder perforation.  She was treated with the sepsis protocol.  She was seen 1 week after the injury and was followed with a Foley catheter.  She was then treated for urgency incontinence with oxybutynin .  She was having right lower quadrant pain.  She was given Gemtesa  samples but did not pick them up   CT cystogram January 01, 2024 demonstrated small posterior midline bladder leak of contrast into the cul-de-sac.  She had a cystogram January 15, 2024 that was normal.  She had a CT scan of the abdomen pelvis for right lower quadrant pain March 26, 2024.  Kidneys normal.  Bladder normal.  Air within the bladder.    Today Patient describes leaking a little bit when she had the catheter in the following the bladder injury.  For the first week after taking out the catheter she had very mild leaking especially when she went from sitting to standing position.  Now she soaks 7 or more pads a day.  She has high-volume bedwetting.  She thinks she is leaking all the time.  She sometimes leaks with coughing sneezing and with urgency.  She says she does not leak without awareness but feels when she leaks   She timed voids every 1 hour to try to stay drier.  She gets up once or twice a night and the flow was good   Before her hysterectomy she was continent but always has had a frequent and urgent bladder   Patient thinks the leaking is from her bladder and not vagina   No history of bladder surgery or recurrent bladder infections or kidney stones   On pelvic examination she had mild  hypermobility of the bladder neck and negative cough test.  No prolapse.  I did a double speculum exam.  No urine in the vagina.  Tissues looked healthy    Patient has severe incontinence following hysterectomy and bladder injury.  The history suggested a fistula but there is no objective evidence today.  I will have her come back in the next few weeks on Gemtesa  for cystoscopy.  Call if urine culture is positive.  Proceed accordingly.  Order urodynamics if she does not have a fistula.  No evidence of bladder or ureteral fistula on CT scan with contrast   I will check a postvoid residual next time but it has been 0 in the past   I will add her onto my schedule in a month.  Call if culture positive  Today Computer issues No benefit of treating UTI and Gemtesa  Sill leaks per urethra   Cysto: bladder mucosa and trigone normal; no cystitis, no stitch or scar; no fistula; urethra normal  Negative cough test before and after cysto  No urine in vagina inspite of filling bladder full       PMH: Past Medical History:  Diagnosis Date   Abnormal uterine bleeding (AUB)    Anxiety, generalized 08/06/2023   CIN III (cervical intraepithelial neoplasia III) 01/2023   colpo 01-31-2023  CIN 3/  LEEP 02-28-2023/  pap/ bx 09-05-2023  HGSIL, CIN3  Headache disorder 09/04/2018   History of child affected by Down syndrome    History of gestational diabetes    Hypertriglyceridemia 07/20/2021   Irregular menstrual cycle 12/14/2023   Migraines    per pt does not take meds   Situational anxiety    12-14-2023  "anxious about being put to sleep for surgery"    Surgical History: Past Surgical History:  Procedure Laterality Date   CESAREAN SECTION N/A 08/11/2017   Procedure: CESAREAN SECTION;  Surgeon: Raynell Caller, MD;  Location: [facility];  Service: Obstetrics;  Laterality: N/A;   VAGINAL HYSTERECTOMY N/A 12/25/2023   Procedure: HYSTERECTOMY VAGINAL;  Surgeon: Granville Layer, MD;   Location: [facility];  Service: Gynecology;  Laterality: N/A;   WISDOM TOOTH EXTRACTION Bilateral     Home Medications:  Allergies as of 05/05/2024       Reactions   Vicks Nyquil Cold & Flu Night [dm-apap-cpm] Rash, Other (See Comments)   Red in face and hot        Medication List        Accurate as of May 05, 2024 12:43 PM. If you have any questions, ask your nurse or doctor.          Gemtesa  75 MG Tabs Generic drug: Vibegron  Take 1 tablet (75 mg total) by mouth daily.        Allergies:  Allergies  Allergen Reactions   Vicks Nyquil Cold & Flu Night [Dm-Apap-Cpm] Rash and Other (See Comments)    Red in face and hot    Family History: Family History  Problem Relation Age of Onset   Kidney failure Mother    Drug abuse Mother    Heart disease Father    Drug abuse Maternal Grandmother    Heart disease Paternal Grandfather    Cancer Paternal Grandfather    Hypertension Paternal Grandfather    Lung cancer Paternal Grandfather    Diabetes Paternal Grandfather    Heart disease Other    Asthma Neg Hx    Birth defects Neg Hx     Social History:  reports that she has never smoked. She has been exposed to tobacco smoke. She has never used smokeless tobacco. She reports current alcohol  use of about 7.0 - 14.0 standard drinks of alcohol  per week. She reports that she does not use drugs.  ROS:                                        Physical Exam: LMP 12/07/2023 (Approximate)   Constitutional:  Alert and oriented, No acute distress. HEENT: [place] AT, moist mucus membranes.  Trachea midline, no masses.  Laboratory Data: Lab Results  Component Value Date   WBC 5.4 01/02/2024   HGB 10.5 (L) 01/02/2024   HCT 31.5 (L) 01/02/2024   MCV 88.0 01/02/2024   PLT 223 01/02/2024    Lab Results  Component Value Date   CREATININE 0.72 01/02/2024    No results found for: "PSA"  No results found for: "TESTOSTERONE"  Lab Results   Component Value Date   HGBA1C 5.2 07/12/2021    Urinalysis    Component Value Date/Time   COLORURINE YELLOW (A) 12/31/2023 2238   APPEARANCEUR Slightly cloudy 03/31/2024 1259   LABSPEC 1.012 12/31/2023 2238   PHURINE 5.0 12/31/2023 2238   GLUCOSEU Negative 03/31/2024 1259   HGBUR LARGE (A) 12/31/2023 2238   BILIRUBINUR  Negative 03/31/2024 1259   KETONESUR NEGATIVE 12/31/2023 2238   PROTEINUR Negative 03/31/2024 1259   PROTEINUR 30 (A) 12/31/2023 2238   UROBILINOGEN negative (A) 01/18/2018 1107   NITRITE Negative 03/31/2024 1259   NITRITE NEGATIVE 12/31/2023 2238   LEUKOCYTESUR Negative 03/31/2024 1259   LEUKOCYTESUR NEGATIVE 12/31/2023 2238    Pertinent Imaging:   Assessment & Plan:  schedule UDS and also look for evidence of fistula; it will be interesting to see if she has overactive bladder vs. SUI   1. Mixed incontinence (Primary)  - Urinalysis, Complete   No follow-ups on file.  Devorah Fonder, MD  [facility] [address], [address] [place], [place] [address] [phone]

## 2024-05-08 LAB — CULTURE, URINE COMPREHENSIVE

## 2024-06-09 ENCOUNTER — Encounter: Payer: Self-pay | Admitting: Urology

## 2024-07-21 ENCOUNTER — Ambulatory Visit (INDEPENDENT_AMBULATORY_CARE_PROVIDER_SITE_OTHER): Admitting: Urology

## 2024-07-21 ENCOUNTER — Encounter: Payer: Self-pay | Admitting: Urology

## 2024-07-21 VITALS — BP 115/77 | HR 89 | Wt 132.8 lb

## 2024-07-21 DIAGNOSIS — N3946 Mixed incontinence: Secondary | ICD-10-CM | POA: Diagnosis not present

## 2024-07-21 LAB — URINALYSIS, COMPLETE
Bilirubin, UA: NEGATIVE
Glucose, UA: NEGATIVE
Ketones, UA: NEGATIVE
Leukocytes,UA: NEGATIVE
Nitrite, UA: NEGATIVE
Protein,UA: NEGATIVE
RBC, UA: NEGATIVE
Specific Gravity, UA: <1.005 — ABNORMAL LOW (ref 1.005–1.030)
Urobilinogen, Ur: 0.2 mg/dL (ref 0.2–1.0)
pH, UA: 6 (ref 5.0–7.5)

## 2024-07-21 LAB — MICROSCOPIC EXAMINATION
Bacteria, UA: NONE SEEN
RBC, Urine: NONE SEEN /HPF (ref 0–2)

## 2024-07-21 MED ORDER — MIRABEGRON ER 50 MG PO TB24: 50.0000 mg | ORAL_TABLET | Freq: Every day | ORAL | 11 refills | Status: DC

## 2024-07-21 NOTE — Progress Notes (Signed)
 07/21/2024 1:15 PM   Caroline [name] Nov 28, 1995 [id number]  Referring provider: Rudolpho Norleen BIRCH, MD [address] [facility] [address] [facility] [place],  [place] [address]  Chief Complaint  Patient presents with   Results    HPI: Br: Patient saw my partner 2 months ago following a transvaginal hysterectomy complicated by small cystotomy.  Postoperatively she had a CT scan with moderate fluid in the pelvis and a bladder perforation.  She was treated with the sepsis protocol.  She was seen 1 week after the injury and was followed with a Foley catheter.  She was then treated for urgency incontinence with oxybutynin .  She was having right lower quadrant pain.  She was given Gemtesa  samples but did not pick them up   CT cystogram January 01, 2024 demonstrated small posterior midline bladder leak of contrast into the cul-de-sac.  She had a cystogram January 15, 2024 that was normal.  She had a CT scan of the abdomen pelvis for right lower quadrant pain March 26, 2024.  Kidneys normal.  Bladder normal.  Air within the bladder.    Today Patient describes leaking a little bit when she had the catheter in the following the bladder injury.  For the first week after taking out the catheter she had very mild leaking especially when she went from sitting to standing position.  Now she soaks 7 or more pads a day.  She has high-volume bedwetting.  She thinks she is leaking all the time.  She sometimes leaks with coughing sneezing and with urgency.  She says she does not leak without awareness but feels when she leaks   She timed voids every 1 hour to try to stay drier.  She gets up once or twice a night and the flow was good   Before her hysterectomy she was continent but always has had a frequent and urgent bladder   Patient thinks the leaking is from her bladder and not vagina   On pelvic examination she had mild hypermobility of the bladder neck and negative cough test.  No prolapse.  I did a double  speculum exam.  No urine in the vagina.  Tissues looked healthy     Patient has severe incontinence following hysterectomy and bladder injury.  The history suggested a fistula but there is no objective evidence today.  I will have her come back in the next few weeks on Gemtesa  for cystoscopy.  Call if urine culture is positive.  Proceed accordingly.  Order urodynamics if she does not have a fistula.  No evidence of bladder or ureteral fistula on CT scan with contrast   I will check a postvoid residual next time but it has been 0 in the past   I will add her onto my schedule in a month.  Call if culture positive   Today Computer issues No benefit of treating UTI and Gemtesa  Sill leaks per urethra    Cysto: bladder mucosa and trigone normal; no cystitis, no stitch or scar; no fistula; urethra normal   Negative cough test before and after cysto   No urine in vagina inspite of filling bladder full     schedule UDS and also look for evidence of fistula; it will be interesting to see if she has overactive bladder vs. SUI   Today Frequency stable.  Incontinence stable.  Last urine culture negative During urodynamics patient voided 19 mL with a maximal flow 6 mL/s.  Residual was a few milliliters.  Maximum bladder  capacity was 223 mL.  She had increased bladder sensation.  Bladder stable her cough leak point pressure at 250 mL was 40 cm of water  with mild leakage.  Valsalva leak point pressure was 26 cm of water  with mild leakage at the same volume.  She leaked a moderate amount when she did positional changes on the table.  During voiding she voided 223 mL.  Maximal flow was 12 mL/s.  Maximum voiding pressure 4 cm of water .  Residual 0 mL fluoroscopically bladder neck distended approximately centimeter and no evidence of fistula    PMH: Past Medical History:  Diagnosis Date   Abnormal uterine bleeding (AUB)    Anxiety, generalized 08/06/2023   CIN III (cervical intraepithelial neoplasia III)  01/2023   colpo 01-31-2023  CIN 3/  LEEP 02-28-2023/  pap/ bx 09-05-2023  HGSIL, CIN3   Headache disorder 09/04/2018   History of child affected by Down syndrome    History of gestational diabetes    Hypertriglyceridemia 07/20/2021   Irregular menstrual cycle 12/14/2023   Migraines    per pt does not take meds   Situational anxiety    12-14-2023  anxious about being put to sleep for surgery    Surgical History: Past Surgical History:  Procedure Laterality Date   CESAREAN SECTION N/A 08/11/2017   Procedure: CESAREAN SECTION;  Surgeon: Izell Harari, MD;  Location: [facility]TH[facility] SUIT[facility];  Service: Obstetrics;  Laterality: N/A;   VAGINAL HYSTERECTOMY N/A 12/25/2023   Procedure: HYSTERECTOMY VAGINAL;  Surgeon: Fredirick Glenys RAMAN, MD;  Location: [facility];  Service: Gynecology;  Laterality: N/A;   WISDOM TOOTH EXTRACTION Bilateral     Home Medications:  Allergies as of 07/21/2024       Reactions   Vicks Nyquil Cold & Flu Night [dm-apap-cpm] Rash, Other (See Comments)   Red in face and hot        Medication List        Accurate as of July 21, 2024  1:15 PM. If you have any questions, ask your nurse or doctor.          Gemtesa  75 MG Tabs Generic drug: Vibegron  Take 1 tablet (75 mg total) by mouth daily.        Allergies:  Allergies  Allergen Reactions   Vicks Nyquil Cold & Flu Night [Dm-Apap-Cpm] Rash and Other (See Comments)    Red in face and hot    Family History: Family History  Problem Relation Age of Onset   Kidney failure Mother    Drug abuse Mother    Heart disease Father    Drug abuse Maternal Grandmother    Heart disease Paternal Grandfather    Cancer Paternal Grandfather    Hypertension Paternal Grandfather    Lung cancer Paternal Grandfather    Diabetes Paternal Grandfather    Heart disease Other    Asthma Neg Hx    Birth defects Neg Hx     Social History:  reports that she has never smoked. She has been exposed to tobacco  smoke. She has never used smokeless tobacco. She reports current alcohol  use of about 7.0 - 14.0 standard drinks of alcohol  per week. She reports that she does not use drugs.  ROS:                                        Physical Exam: BP 115/77 (BP Location: Left  Arm, Patient Position: Sitting, Cuff Size: Normal)   Pulse 89   Wt 60.2 kg   LMP 12/07/2023 (Approximate)   SpO2 97%   BMI 23.90 kg/m   Constitutional:  Alert and oriented, No acute distress. HEENT:  AT, moist mucus membranes.  Trachea midline, no masses.  Laboratory Data: Lab Results  Component Value Date   WBC 5.4 01/02/2024   HGB 10.5 (L) 01/02/2024   HCT 31.5 (L) 01/02/2024   MCV 88.0 01/02/2024   PLT 223 01/02/2024    Lab Results  Component Value Date   CREATININE 0.72 01/02/2024    No results found for: PSA  No results found for: TESTOSTERONE  Lab Results  Component Value Date   HGBA1C 5.2 07/12/2021    Urinalysis    Component Value Date/Time   COLORURINE YELLOW (A) 12/31/2023 2238   APPEARANCEUR Clear 05/05/2024 1256   LABSPEC 1.012 12/31/2023 2238   PHURINE 5.0 12/31/2023 2238   GLUCOSEU Negative 05/05/2024 1256   HGBUR LARGE (A) 12/31/2023 2238   BILIRUBINUR Negative 05/05/2024 1256   KETONESUR NEGATIVE 12/31/2023 2238   PROTEINUR Negative 05/05/2024 1256   PROTEINUR 30 (A) 12/31/2023 2238   UROBILINOGEN negative (A) 01/18/2018 1107   NITRITE Negative 05/05/2024 1256   NITRITE NEGATIVE 12/31/2023 2238   LEUKOCYTESUR Negative 05/05/2024 1256   LEUKOCYTESUR NEGATIVE 12/31/2023 2238    Pertinent Imaging:   Assessment & Plan: The patient still has bedwetting and high-volume urinary incontinence.  I briefly mentioned a sling but she had talked again about a bulking agent.  I went over the bulking agent with full template.  I think it is a good choice but a bit concerned based on severity it may not reach her treatment goal but certain she is very young to have a  sling.  I will try her on Myrbetriq  and schedule the bulking agent in [place].  Usual template and sequelae discussed  Patient really does not want more surgery and I do not blame her.  I shared with her that I am surprised how much incontinence she has has in spite of the history noted  1. Mixed incontinence (Primary)  - Urinalysis, Complete   No follow-ups on file.  Glendia DELENA Elizabeth, MD  [facility] [address] [place], [place] [address] [phone]

## 2024-08-15 ENCOUNTER — Other Ambulatory Visit: Payer: Self-pay

## 2024-08-15 DIAGNOSIS — B379 Candidiasis, unspecified: Secondary | ICD-10-CM

## 2024-08-15 MED ORDER — FLUCONAZOLE 150 MG PO TABS: 150.0000 mg | ORAL_TABLET | Freq: Once | ORAL | 0 refills | Status: AC

## 2024-09-22 ENCOUNTER — Ambulatory Visit (INDEPENDENT_AMBULATORY_CARE_PROVIDER_SITE_OTHER): Admitting: Urology

## 2024-09-22 VITALS — BP 107/68 | HR 74

## 2024-09-22 DIAGNOSIS — N3946 Mixed incontinence: Secondary | ICD-10-CM

## 2024-09-22 DIAGNOSIS — B379 Candidiasis, unspecified: Secondary | ICD-10-CM

## 2024-09-22 LAB — URINALYSIS, COMPLETE
Bilirubin, UA: NEGATIVE
Glucose, UA: NEGATIVE
Ketones, UA: NEGATIVE
Leukocytes,UA: NEGATIVE
Nitrite, UA: NEGATIVE
Protein,UA: NEGATIVE
RBC, UA: NEGATIVE
Specific Gravity, UA: 1.025 (ref 1.005–1.030)
Urobilinogen, Ur: 0.2 mg/dL (ref 0.2–1.0)
pH, UA: 6 (ref 5.0–7.5)

## 2024-09-22 LAB — MICROSCOPIC EXAMINATION: Bacteria, UA: NONE SEEN

## 2024-09-22 MED ORDER — GEMTESA 75 MG PO TABS: 75.0000 mg | ORAL_TABLET | Freq: Every day | ORAL | 11 refills | Status: DC

## 2024-09-22 NOTE — Progress Notes (Signed)
 09/22/2024 9:01 AM   Caroline [name] 1996-06-10 [id number]  Referring provider: Rudolpho Norleen BIRCH, MD [address] [facility] [address] [employer] [place],  [place] [address]  Chief Complaint  Patient presents with   Follow-up   Urinary Incontinence    HPI: Br: Patient saw my partner 2 months ago following a transvaginal hysterectomy complicated by small cystotomy.  Postoperatively she had a CT scan with moderate fluid in the pelvis and a bladder perforation.  She was treated with the sepsis protocol.  She was seen 1 week after the injury and was followed with a Foley catheter.  She was then treated for urgency incontinence with oxybutynin .  She was having right lower quadrant pain.  She was given Gemtesa  samples but did not pick them up   CT cystogram January 01, 2024 demonstrated small posterior midline bladder leak of contrast into the cul-de-sac.  She had a cystogram January 15, 2024 that was normal.  She had a CT scan of the abdomen pelvis for right lower quadrant pain March 26, 2024.  Kidneys normal.  Bladder normal.  Air within the bladder.    Today Patient describes leaking a little bit when she had the catheter in the following the bladder injury.  For the first week after taking out the catheter she had very mild leaking especially when she went from sitting to standing position.  Now she soaks 7 or more pads a day.  She has high-volume bedwetting.  She thinks she is leaking all the time.  She sometimes leaks with coughing sneezing and with urgency.  She says she does not leak without awareness but feels when she leaks   She timed voids every 1 hour to try to stay drier.  She gets up once or twice a night and the flow was good   Before her hysterectomy she was continent but always has had a frequent and urgent bladder   Patient thinks the leaking is from her bladder and not vagina   On pelvic examination she had mild hypermobility of the bladder neck and negative cough test.  No  prolapse.  I did a double speculum exam.  No urine in the vagina.  Tissues looked healthy     Patient has severe incontinence following hysterectomy and bladder injury.  The history suggested a fistula but there is no objective evidence today.  I will have her come back in the next few weeks on Gemtesa  for cystoscopy.  Call if urine culture is positive.  Proceed accordingly.  Order urodynamics if she does not have a fistula.  No evidence of bladder or ureteral fistula on CT scan with contrast   I will check a postvoid residual next time but it has been 0 in the past   I will add her onto my schedule in a month.  Call if culture positive   Today Computer issues No benefit of treating UTI and Gemtesa  Sill leaks per urethra    Cysto: bladder mucosa and trigone normal; no cystitis, no stitch or scar; no fistula; urethra normal   Negative cough test before and after cysto   No urine in vagina inspite of filling bladder full      schedule UDS and also look for evidence of fistula; it will be interesting to see if she has overactive bladder vs. SUI    Today Frequency stable.  Incontinence stable.  Last urine culture negative During urodynamics patient voided 19 mL with a maximal flow 6 mL/s.  Residual was  a few milliliters.  Maximum bladder capacity was 223 mL.  She had increased bladder sensation.  Bladder stable her cough leak point pressure at 250 mL was 40 cm of water  with mild leakage.  Valsalva leak point pressure was 26 cm of water  with mild leakage at the same volume.  She leaked a moderate amount when she did positional changes on the table.  During voiding she voided 223 mL.  Maximal flow was 12 mL/s.  Maximum voiding pressure 4 cm of water .  Residual 0 mL fluoroscopically bladder neck distended approximately centimeter and no evidence of fistula    The patient still has bedwetting and high-volume urinary incontinence.  I briefly mentioned a sling but she had talked again about a bulking  agent.  I went over the bulking agent with full template.  I think it is a good choice but a bit concerned based on severity it may not reach her treatment goal but certain she is very young to have a sling.  I will try her on Myrbetriq  and schedule the bulking agent in [place].  Usual template and sequelae discussed   Patient really does not want more surgery and I do not blame her.  I shared with her that I am surprised how much incontinence she has has in spite of the history noted  Today In early September patient had bulking agent treatment in [place] and the treatment went very well. Patient has had approximately 40% improvement.  She still uses 4-6 pads a day but she is really changing them because they are wet but they are not soaked like they were before.  Her bedwetting is no longer soaking and she just has dampness while she sleeping.  She leaks a small amount with coughing instead of a large amount.  She still leaks with sneezing.  She still has urge incontinence.  She is not on Myrbetriq .  She has had oxybutynin  and Gemtesa  in the past  Clinically not infected and frequency stable   PMH: Past Medical History:  Diagnosis Date   Abnormal uterine bleeding (AUB)    Anxiety, generalized 08/06/2023   CIN III (cervical intraepithelial neoplasia III) 01/2023   colpo 01-31-2023  CIN 3/  LEEP 02-28-2023/  pap/ bx 09-05-2023  HGSIL, CIN3   Headache disorder 09/04/2018   History of child affected by Down syndrome    History of gestational diabetes    Hypertriglyceridemia 07/20/2021   Irregular menstrual cycle 12/14/2023   Migraines    per pt does not take meds   Situational anxiety    12-14-2023  anxious about being put to sleep for surgery    Surgical History: Past Surgical History:  Procedure Laterality Date   CESAREAN SECTION N/A 08/11/2017   Procedure: CESAREAN SECTION;  Surgeon: Izell Harari, MD;  Location: [facility];  Service: Obstetrics;  Laterality: N/A;    VAGINAL HYSTERECTOMY N/A 12/25/2023   Procedure: HYSTERECTOMY VAGINAL;  Surgeon: Fredirick Glenys RAMAN, MD;  Location: [facility];  Service: Gynecology;  Laterality: N/A;   WISDOM TOOTH EXTRACTION Bilateral     Home Medications:  Allergies as of 09/22/2024       Reactions   Vicks Nyquil Cold & Flu Night [dm-apap-cpm] Rash, Other (See Comments)   Red in face and hot        Medication List        Accurate as of September 22, 2024  9:01 AM. If you have any questions, ask your nurse or doctor.  mirabegron  ER 50 MG Tb24 tablet Commonly known as: MYRBETRIQ  Take 1 tablet (50 mg total) by mouth daily.        Allergies:  Allergies  Allergen Reactions   Vicks Nyquil Cold & Flu Night [Dm-Apap-Cpm] Rash and Other (See Comments)    Red in face and hot    Family History: Family History  Problem Relation Age of Onset   Kidney failure Mother    Drug abuse Mother    Heart disease Father    Drug abuse Maternal Grandmother    Heart disease Paternal Grandfather    Cancer Paternal Grandfather    Hypertension Paternal Grandfather    Lung cancer Paternal Grandfather    Diabetes Paternal Grandfather    Heart disease Other    Asthma Neg Hx    Birth defects Neg Hx     Social History:  reports that she has never smoked. She has been exposed to tobacco smoke. She has never used smokeless tobacco. She reports current alcohol  use of about 7.0 - 14.0 standard drinks of alcohol  per week. She reports that she does not use drugs.  ROS:                                        Physical Exam: LMP 12/07/2023 (Approximate)   Constitutional:  Alert and oriented, No acute distress. HEENT: [place] AT, moist mucus membranes.  Trachea midline, no masses.   Laboratory Data: Lab Results  Component Value Date   WBC 5.4 01/02/2024   HGB 10.5 (L) 01/02/2024   HCT 31.5 (L) 01/02/2024   MCV 88.0 01/02/2024   PLT 223 01/02/2024    Lab Results  Component  Value Date   CREATININE 0.72 01/02/2024    No results found for: PSA  No results found for: TESTOSTERONE  Lab Results  Component Value Date   HGBA1C 5.2 07/12/2021    Urinalysis    Component Value Date/Time   COLORURINE YELLOW (A) 12/31/2023 2238   APPEARANCEUR Clear 07/21/2024 1259   LABSPEC 1.012 12/31/2023 2238   PHURINE 5.0 12/31/2023 2238   GLUCOSEU Negative 07/21/2024 1259   HGBUR LARGE (A) 12/31/2023 2238   BILIRUBINUR Negative 07/21/2024 1259   KETONESUR NEGATIVE 12/31/2023 2238   PROTEINUR Negative 07/21/2024 1259   PROTEINUR 30 (A) 12/31/2023 2238   UROBILINOGEN negative (A) 01/18/2018 1107   NITRITE Negative 07/21/2024 1259   NITRITE NEGATIVE 12/31/2023 2238   LEUKOCYTESUR Negative 07/21/2024 1259   LEUKOCYTESUR NEGATIVE 12/31/2023 2238    Pertinent Imaging: Urine reviewed and sent for culture  Assessment & Plan: The patient appears to have mixed incontinence and again a complicated presentation.  I am going to treat her with the overactive bladder pathway and I certainly think repeating a bulking agent and treatment in a few months is not unreasonable.  She tolerated very well.  It may be prudent to do a repeat bulking agent instead of a third line OAB therapy initially The patient will be given Gemtesa  since we both agree that may work better now that the bulking agent treatment has been done.  She will call me in a month and give me an update.  I will either add Vesicare or switch her to Vesicare pending her phone call.  I will see her back in 8 weeks and call if culture positive.  She understands a bulking agent needs about 4 months before repeating  1.  Yeast infection (Primary)  - Urinalysis, Complete  2. Mixed incontinence  - Urinalysis, Complete   No follow-ups on file.  Caroline DELENA Elizabeth, MD  [facility] [address] [place], [place] [address] [phone]

## 2024-09-23 MED ORDER — GEMTESA 75 MG PO TABS: 75.0000 mg | ORAL_TABLET | Freq: Every day | ORAL | 0 refills | Status: DC

## 2024-09-23 NOTE — Addendum Note (Signed)
 Addended byBETHA CORIE PLATER on: 09/23/2024 10:59 AM   Modules accepted: Orders

## 2024-09-25 LAB — CULTURE, URINE COMPREHENSIVE

## 2024-10-06 ENCOUNTER — Encounter: Payer: Self-pay | Admitting: Urology

## 2024-10-06 DIAGNOSIS — R32 Unspecified urinary incontinence: Secondary | ICD-10-CM

## 2024-10-06 DIAGNOSIS — N3946 Mixed incontinence: Secondary | ICD-10-CM

## 2024-10-06 MED ORDER — GEMTESA 75 MG PO TABS: 75.0000 mg | ORAL_TABLET | Freq: Every day | ORAL | 11 refills | Status: DC

## 2024-10-06 MED ORDER — SOLIFENACIN SUCCINATE 5 MG PO TABS: 5.0000 mg | ORAL_TABLET | Freq: Every day | ORAL | 11 refills | Status: DC

## 2024-11-28 ENCOUNTER — Encounter: Payer: Self-pay | Admitting: Urology

## 2024-11-28 DIAGNOSIS — N3946 Mixed incontinence: Secondary | ICD-10-CM

## 2024-11-28 DIAGNOSIS — R32 Unspecified urinary incontinence: Secondary | ICD-10-CM

## 2024-12-01 MED ORDER — SOLIFENACIN SUCCINATE 5 MG PO TABS: 5.0000 mg | ORAL_TABLET | Freq: Every day | ORAL | 11 refills | Status: AC

## 2024-12-08 ENCOUNTER — Other Ambulatory Visit: Payer: Self-pay | Admitting: Physician Assistant

## 2024-12-08 DIAGNOSIS — B379 Candidiasis, unspecified: Secondary | ICD-10-CM

## 2024-12-09 MED ORDER — FLUCONAZOLE 100 MG PO TABS: 100.0000 mg | ORAL_TABLET | Freq: Every day | ORAL | 0 refills | Status: AC

## 2024-12-09 NOTE — Addendum Note (Signed)
 Addended byBETHA CORIE PLATER on: 12/09/2024 01:33 PM   Modules accepted: Orders

## 2024-12-15 ENCOUNTER — Ambulatory Visit: Admitting: Urology

## 2025-02-09 ENCOUNTER — Ambulatory Visit: Admitting: Urology
